# Patient Record
Sex: Female | Born: 1970
Health system: Southern US, Community
[De-identification: ages and names within clinical notes are randomized; demographics above are authoritative.]

## PROBLEM LIST (undated history)

## (undated) DIAGNOSIS — F32A Depression, unspecified: Secondary | ICD-10-CM

## (undated) DIAGNOSIS — J302 Other seasonal allergic rhinitis: Secondary | ICD-10-CM

## (undated) DIAGNOSIS — F329 Major depressive disorder, single episode, unspecified: Secondary | ICD-10-CM

## (undated) DIAGNOSIS — K589 Irritable bowel syndrome without diarrhea: Secondary | ICD-10-CM

## (undated) DIAGNOSIS — T7840XA Allergy, unspecified, initial encounter: Secondary | ICD-10-CM

## (undated) DIAGNOSIS — J45909 Unspecified asthma, uncomplicated: Secondary | ICD-10-CM

## (undated) DIAGNOSIS — F419 Anxiety disorder, unspecified: Secondary | ICD-10-CM

## (undated) HISTORY — DX: Unspecified asthma, uncomplicated: J45.909

## (undated) HISTORY — DX: Other seasonal allergic rhinitis: J30.2

## (undated) HISTORY — DX: Major depressive disorder, single episode, unspecified: F32.9

## (undated) HISTORY — PX: COLPOSCOPY: SHX161

## (undated) HISTORY — DX: Irritable bowel syndrome, unspecified: K58.9

## (undated) HISTORY — DX: Allergy, unspecified, initial encounter: T78.40XA

## (undated) HISTORY — DX: Depression, unspecified: F32.A

## (undated) HISTORY — PX: BUNIONECTOMY: SHX129

## (undated) HISTORY — DX: Anxiety disorder, unspecified: F41.9

---

## 1999-08-22 ENCOUNTER — Other Ambulatory Visit: Admission: RE | Admit: 1999-08-22 | Discharge: 1999-08-22 | Payer: Self-pay | Admitting: Obstetrics and Gynecology

## 2000-11-25 ENCOUNTER — Other Ambulatory Visit: Admission: RE | Admit: 2000-11-25 | Discharge: 2000-11-25 | Payer: Self-pay | Admitting: Obstetrics and Gynecology

## 2001-01-11 ENCOUNTER — Other Ambulatory Visit: Admission: RE | Admit: 2001-01-11 | Discharge: 2001-01-11 | Payer: Self-pay | Admitting: Obstetrics and Gynecology

## 2001-05-25 ENCOUNTER — Other Ambulatory Visit: Admission: RE | Admit: 2001-05-25 | Discharge: 2001-05-25 | Payer: Self-pay | Admitting: Obstetrics and Gynecology

## 2001-07-29 ENCOUNTER — Ambulatory Visit (HOSPITAL_COMMUNITY): Admission: RE | Admit: 2001-07-29 | Discharge: 2001-07-29 | Payer: Self-pay | Admitting: Obstetrics and Gynecology

## 2001-12-16 ENCOUNTER — Other Ambulatory Visit: Admission: RE | Admit: 2001-12-16 | Discharge: 2001-12-16 | Payer: Self-pay | Admitting: Obstetrics and Gynecology

## 2002-12-19 ENCOUNTER — Other Ambulatory Visit: Admission: RE | Admit: 2002-12-19 | Discharge: 2002-12-19 | Payer: Self-pay | Admitting: Obstetrics and Gynecology

## 2003-12-26 ENCOUNTER — Other Ambulatory Visit: Admission: RE | Admit: 2003-12-26 | Discharge: 2003-12-26 | Payer: Self-pay | Admitting: Obstetrics and Gynecology

## 2005-06-12 ENCOUNTER — Other Ambulatory Visit: Admission: RE | Admit: 2005-06-12 | Discharge: 2005-06-12 | Payer: Self-pay | Admitting: Obstetrics and Gynecology

## 2005-06-25 ENCOUNTER — Emergency Department (HOSPITAL_COMMUNITY): Admission: EM | Admit: 2005-06-25 | Discharge: 2005-06-26 | Payer: Self-pay | Admitting: Emergency Medicine

## 2005-06-29 ENCOUNTER — Emergency Department (HOSPITAL_COMMUNITY): Admission: EM | Admit: 2005-06-29 | Discharge: 2005-06-29 | Payer: Self-pay | Admitting: Emergency Medicine

## 2007-01-25 ENCOUNTER — Inpatient Hospital Stay (HOSPITAL_COMMUNITY): Admission: AD | Admit: 2007-01-25 | Discharge: 2007-02-01 | Payer: Self-pay | Admitting: Psychiatry

## 2007-01-25 ENCOUNTER — Emergency Department (HOSPITAL_COMMUNITY): Admission: EM | Admit: 2007-01-25 | Discharge: 2007-01-25 | Payer: Self-pay | Admitting: Emergency Medicine

## 2007-01-25 ENCOUNTER — Ambulatory Visit: Payer: Self-pay | Admitting: Psychiatry

## 2007-04-06 ENCOUNTER — Emergency Department (HOSPITAL_COMMUNITY): Admission: EM | Admit: 2007-04-06 | Discharge: 2007-04-06 | Payer: Self-pay | Admitting: Emergency Medicine

## 2009-10-16 ENCOUNTER — Encounter: Admission: RE | Admit: 2009-10-16 | Discharge: 2009-10-16 | Payer: Self-pay | Admitting: Obstetrics and Gynecology

## 2011-05-15 NOTE — Discharge Summary (Signed)
NAMEMERCADES, BAJAJ            ACCOUNT NO.:  [id number]   MEDICAL RECORD NO.:  [id number]          PATIENT TYPE:  IPS   LOCATION:  0302                          FACILITY:  [facility]   PHYSICIAN:  Anselm Jungling, MD  DATE OF BIRTH:  1971/11/28   DATE OF ADMISSION:  01/25/2007  DATE OF DISCHARGE:  02/01/2007                               DISCHARGE SUMMARY   IDENTIFYING DATA AND REASON FOR ADMISSION:  This was an inpatient  psychiatric admission for Shelia Ramos, a 40 year old married mother, who  presented with longstanding but untreated symptoms of psychosis, in  association with substance abuse.  She came to [place] on a regimen of  Remeron, trazodone, and Valium, apparently prescribed by her family  physician.  She denied drug or alcohol use, but in the course of her  hospital stay we learned that the patient had been abusing  benzodiazepine medications, some which she had apparently been getting  through diversionary means.  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  There was some question of  delusional and/or paranoid thinking.  There was also some question of  suicidal ideation.  The patient had had a past history of anorexia as a  teenager.  She had not slept for 3 days.  There was a history of bipolar  disorder in her mother.  Please refer to the admission note for further  details pertaining to the symptoms, circumstances and history that led  to her hospitalization.  She was given an initial Axis I diagnosis of  rule out bipolar disorder versus schizoaffective disorder, rule out  substance abuse NOS.   MEDICAL AND LABORATORY:  The patient was in good health without any  active or chronic medical problems.  She was noted to have sinus  tachycardia, which apparently predated her hospital stay and its  associated benzodiazepine withdrawal.  She was closely followed by the  psychiatric nurse practitioner, and at the time of discharge was  referred for further medical followup to occur on February 02, 2007, the  day following discharge.  She was, at the end of her hospital stay,  prescribed propranolol 10 mg t.i.d. which was intended both to address  tachycardia and anxiety.   HOSPITAL COURSE:  The patient was admitted to the adult inpatient  psychiatric service.  She presented as a well-nourished, well-developed  woman who was alert, fully oriented, and initially quite tearful,  distraught and anxious.  Her thoughts and speech appeared to be normally  organized, but some staff noted some thought-blocking.  She did not make  any delusional statements.  She stated I want to be able to get off  medicines and have somewhat to talk to, and find ways to better myself.   She was not continued on Remeron or Valium, but was placed on a trial of  Lexapro 10 mg daily as well as h.s. Risperdal to address what appeared  to be mild psychotic symptoms.  These were all well tolerated.   The patient participated in various therapeutic groups and activities  and was a reasonably good participant.  As her stay progressed, she  became more open and forthcoming about circumstances prior to admission.  She reported that she had been having some auditory hallucinations.  She  stated that she had been using ill-gotten benzodiazepines, not to get  high but to feel calmer.   On the fourth hospital day there was a family session involving the  patient and her husband, mother and daughter.  In that meeting the  patient stated that she was not feeling suicidal.  The patient's husband  stated that the family was going to be very supportive of the patient.  They encouraged her to express her feelings more.  They discussed the  fact of the patient's job being stressful for her.  They described her  as a very nurturing person who takes on extra responsibilities at home  and at work.  Husband stated that he was going to try to be more  understanding of  her needs and make sure that the patient gets the help  that she needs.  The patient talked of taking some additional time off  from work.  They discussed followup with a psychiatrist as well as an  Network engineer.  The counselor gave them the pamphlet on suicide  prevention and the crisis hotline.   The patient's hospital stay continued an additional 4 days.  Over that  time she gradually appeared more relaxed, better organized, and the  previously-present subtle indications of psychosis and thought disorder  appeared to abate.  She appeared to be appropriate for discharge on the  eighth hospital day.  She agreed to the following aftercare plan.   AFTERCARE:  The patient was to follow up with Michaelle Copas and for  individual therapy on the day following discharge, February 02, 2007.  She was to follow up with Oneta Rack, physician assistant, for  medication management on February 17, 2007.  Both of these appointments  are at the [facility].   DISCHARGE MEDICATIONS:  1. Lexapro 10 mg daily.  2. Risperdal 1 mg q.h.s.  3. Propranolol 10 mg t.i.d.   The patient was also given an appointment for Dr. Hyacinth Meeker for medical  followup on February 02, 2007.   DISCHARGE DIAGNOSES:  AXIS I:  Brief psychotic disorder, not otherwise  specified, resolving.  Mood disorder not otherwise specified.  Substance abuse not otherwise specified.  AXIS II:  Deferred.  AXIS III:  Tachycardia, unknown etiology.  AXIS IV:  Stressors severe.  AXIS V: Global assessment of functioning on discharge 63.      Anselm Jungling, MD  Electronically Signed     SPB/MEDQ  D:  02/02/2007  T:  02/02/2007  Job:  [phone]

## 2012-07-17 ENCOUNTER — Emergency Department (HOSPITAL_COMMUNITY)
Admission: EM | Admit: 2012-07-17 | Discharge: 2012-07-18 | Disposition: A | Discharge status: Home / Self Care | Payer: 59 | Attending: Emergency Medicine | Admitting: Emergency Medicine

## 2012-07-17 DIAGNOSIS — X58XXXA Exposure to other specified factors, initial encounter: Secondary | ICD-10-CM | POA: Insufficient documentation

## 2012-07-17 DIAGNOSIS — T7809XA Anaphylactic reaction due to other food products, initial encounter: Secondary | ICD-10-CM | POA: Insufficient documentation

## 2012-08-19 ENCOUNTER — Encounter: Payer: Self-pay | Admitting: Gastroenterology

## 2012-09-27 ENCOUNTER — Ambulatory Visit: Payer: 59 | Admitting: Gastroenterology

## 2012-10-25 ENCOUNTER — Ambulatory Visit: Payer: 59 | Admitting: Gastroenterology

## 2014-01-25 ENCOUNTER — Other Ambulatory Visit: Payer: Self-pay | Admitting: Obstetrics and Gynecology

## 2014-01-25 DIAGNOSIS — R928 Other abnormal and inconclusive findings on diagnostic imaging of breast: Secondary | ICD-10-CM

## 2014-02-02 ENCOUNTER — Ambulatory Visit
Admission: RE | Admit: 2014-02-02 | Discharge: 2014-02-02 | Disposition: A | Discharge status: Home / Self Care | Payer: 59 | Source: Ambulatory Visit | Attending: Obstetrics and Gynecology | Admitting: Obstetrics and Gynecology

## 2014-02-02 DIAGNOSIS — R928 Other abnormal and inconclusive findings on diagnostic imaging of breast: Secondary | ICD-10-CM

## 2014-10-25 ENCOUNTER — Telehealth: Payer: Self-pay | Admitting: Gastroenterology

## 2014-10-26 NOTE — Telephone Encounter (Signed)
Please try to find a spot with an extender

## 2014-10-30 ENCOUNTER — Ambulatory Visit (INDEPENDENT_AMBULATORY_CARE_PROVIDER_SITE_OTHER): Payer: 59 | Admitting: Physician Assistant

## 2014-10-30 ENCOUNTER — Encounter: Payer: Self-pay | Admitting: Physician Assistant

## 2014-10-30 VITALS — BP 110/68 | HR 105 | Ht 63.0 in | Wt 113.0 lb

## 2014-10-30 DIAGNOSIS — K589 Irritable bowel syndrome without diarrhea: Secondary | ICD-10-CM

## 2014-10-30 DIAGNOSIS — R143 Flatulence: Secondary | ICD-10-CM

## 2014-10-30 DIAGNOSIS — K921 Melena: Secondary | ICD-10-CM

## 2014-10-30 DIAGNOSIS — IMO0001 Reserved for inherently not codable concepts without codable children: Secondary | ICD-10-CM

## 2014-10-30 DIAGNOSIS — R194 Change in bowel habit: Secondary | ICD-10-CM

## 2014-10-30 MED ORDER — MOVIPREP 100 G PO SOLR: 1.0000 | Freq: Once | ORAL | Status: DC

## 2014-10-30 MED ORDER — HYOSCYAMINE SULFATE 0.125 MG SL SUBL: 0.1250 mg | SUBLINGUAL_TABLET | Freq: Four times a day (QID) | SUBLINGUAL | Status: DC | PRN

## 2014-10-30 MED ORDER — METRONIDAZOLE 250 MG PO TABS: 250.0000 mg | ORAL_TABLET | Freq: Three times a day (TID) | ORAL | Status: DC

## 2014-10-30 NOTE — Progress Notes (Signed)
Reviewed and agree with management. Robert D. Kaplan, M.D., FACG  

## 2014-10-30 NOTE — Patient Instructions (Signed)
You have been scheduled for a colonoscopy. Please follow written instructions given to you at your visit today.  Please pick up your prep kit at the pharmacy within the next 1-3 days. If you use inhalers (even only as needed), please bring them with you on the day of your procedure. Your physician has requested that you go to www.startemmi.com and enter the access code given to you at your visit today. This web site gives a general overview about your procedure. However, you should still follow specific instructions given to you by our office regarding your preparation for the procedure.  We have sent the following medications to your pharmacy for you to pick up at your convenience: Flagyl 250 mg, please take one tablet by mouth three times daily for ten days  Hyomax 0.125 mg, please take one tablet every six hours as needed for abdominal cramping/pain   Please start a high fiber diet information is below for your review   Increase fluid intake   Please purchase Benefiber over the counter and take one tablespoon mix in juice or water and drink once daily _________________________________________________________________________________________________________________________________________________________  High-Fiber Diet Fiber is found in fruits, vegetables, and grains. A high-fiber diet encourages the addition of more whole grains, legumes, fruits, and vegetables in your diet. The recommended amount of fiber for adult males is 38 g per day. For adult females, it is 25 g per day. Pregnant and lactating women should get 28 g of fiber per day. If you have a digestive or bowel problem, ask your caregiver for advice before adding high-fiber foods to your diet. Eat a variety of high-fiber foods instead of only a select few type of foods.  PURPOSE  To increase stool bulk.  To make bowel movements more regular to prevent constipation.  To lower cholesterol.  To prevent overeating. WHEN IS THIS  DIET USED?  It may be used if you have constipation and hemorrhoids.  It may be used if you have uncomplicated diverticulosis (intestine condition) and irritable bowel syndrome.  It may be used if you need help with weight management.  It may be used if you want to add it to your diet as a protective measure against atherosclerosis, diabetes, and cancer. SOURCES OF FIBER  Whole-grain breads and cereals.  Fruits, such as apples, oranges, bananas, berries, prunes, and pears.  Vegetables, such as green peas, carrots, sweet potatoes, beets, broccoli, cabbage, spinach, and artichokes.  Legumes, such split peas, soy, lentils.  Almonds. FIBER CONTENT IN FOODS Starches and Grains / Dietary Fiber (g)  Cheerios, 1 cup / 3 g  Corn Flakes cereal, 1 cup / 0.7 g  Rice crispy treat cereal, 1 cup / 0.3 g  Instant oatmeal (cooked),  cup / 2 g  Frosted wheat cereal, 1 cup / 5.1 g  Brown, long-grain rice (cooked), 1 cup / 3.5 g  White, long-grain rice (cooked), 1 cup / 0.6 g  Enriched macaroni (cooked), 1 cup / 2.5 g Legumes / Dietary Fiber (g)  Baked beans (canned, plain, or vegetarian),  cup / 5.2 g  Kidney beans (canned),  cup / 6.8 g  Pinto beans (cooked),  cup / 5.5 g Breads and Crackers / Dietary Fiber (g)  Plain or honey graham crackers, 2 squares / 0.7 g  Saltine crackers, 3 squares / 0.3 g  Plain, salted pretzels, 10 pieces / 1.8 g  Whole-wheat bread, 1 slice / 1.9 g  White bread, 1 slice / 0.7 g  Raisin bread, 1 slice /  1.2 g  Plain bagel, 3 oz / 2 g  Flour tortilla, 1 oz / 0.9 g  Corn tortilla, 1 small / 1.5 g  Hamburger or hotdog bun, 1 small / 0.9 g Fruits / Dietary Fiber (g)  Apple with skin, 1 medium / 4.4 g  Sweetened applesauce,  cup / 1.5 g  Banana,  medium / 1.5 g  Grapes, 10 grapes / 0.4 g  Orange, 1 small / 2.3 g  Raisin, 1.5 oz / 1.6 g  Melon, 1 cup / 1.4 g Vegetables / Dietary Fiber (g)  Green beans (canned),  cup / 1.3  g  Carrots (cooked),  cup / 2.3 g  Broccoli (cooked),  cup / 2.8 g  Peas (cooked),  cup / 4.4 g  Mashed potatoes,  cup / 1.6 g  Lettuce, 1 cup / 0.5 g  Corn (canned),  cup / 1.6 g  Tomato,  cup / 1.1 g Document Released: 12/14/2005 Document Revised: 06/14/2012 Document Reviewed: 03/17/2012 ExitCare Patient Information 2015 [place], [place]. This information is not intended to replace advice given to you by your health care provider. Make sure you discuss any questions you have with your health care provider.

## 2014-10-30 NOTE — Progress Notes (Signed)
Patient ID: Shelia Ramos, female   DOB: 02-26-1971, 43 y.o.   MRN: [id number]    ASN:[id number] is a 43 year old female who was self-referred for evaluation due to a change in bowel habits, blood with bowel movements, excessive gas, and a history of irritable bowel syndrome.  [name] says that she had been seen by Dr. Deatra Ina in 2000 at which time she was diagnosed with IBS .Since that time she has been feeling well and was having a normal formed bowel movement on a daily basis. Approximately 8 months ago ,she began to have excessive gas, and lower abdominal pain with lots of rumbling. Her bowel movements are hard nuggets, and she typically skips 2 days between bowel movements. She has had multiple episodes where she has had blood on the toilet tissue when she wipes. She has not had blood dripping in the commode or blood mixed in with her stool. She denies rectal pain itching or burning. She reports that she does have a rectocele but she does not have to manually press on the perineum to help with bowel movements come out. Her bowel movements have been good and she's had no weight loss she has had no nausea or vomiting and she has no melena. There is no known family history of colon cancer colon polyps Crohn's disease or ulcerative colitis. She states she had a colonoscopy in her early to mid 49s by Dr. Deatra Ina at which time she was told she had irritable bowel syndrome. She has tried using fiber choice tablets at bedtime with little relief. She reports that she also has a cystocele and has some stress incontinence.She is unable to identify any specific foods that increase her gas. She often will get lower abdominal cramping prior to defecation which is relieved with defecation.   Past Medical History  Diagnosis Date  . Seasonal allergies   . Anxiety     Past Surgical History  Procedure Laterality Date  . Colposcopy    . Bunionectomy     Family History  Problem Relation Age of Onset  . Anxiety  disorder Mother    History  Substance Use Topics  . Smoking status: Never Smoker   . Smokeless tobacco: Not on file  . Alcohol Use: No   Current Outpatient Prescriptions  Medication Sig Dispense Refill  . clonazePAM (KLONOPIN) 0.5 MG tablet Take 0.5 mg by mouth as needed for anxiety.    . ziprasidone (GEODON) 40 MG capsule Take 40 mg by mouth at bedtime.    . hyoscyamine (LEVSIN SL) 0.125 MG SL tablet Place 1 tablet (0.125 mg total) under the tongue every 6 (six) hours as needed. 60 tablet 1  . metroNIDAZOLE (FLAGYL) 250 MG tablet Take 1 tablet (250 mg total) by mouth 3 (three) times daily. 30 tablet 0  . MOVIPREP 100 G SOLR Take 1 kit (200 g total) by mouth once. 1 kit 0   No current facility-administered medications for this visit.   No Known Allergies   Review of Systems: Gen: Denies any fever, chills, sweats, anorexia, fatigue, weakness, malaise, weight loss, and sleep disorder CV: Denies chest pain, angina, palpitations, syncope, orthopnea, PND, peripheral edema, and claudication. Resp: Denies dyspnea at rest, dyspnea with exercise, cough, sputum, wheezing, coughing up blood, and pleurisy. GI: Denies vomiting blood, jaundice, and fecal incontinence.   Denies dysphagia or odynophagia. GU : Denies urinary burning, blood in urine, urinary frequency, urinary hesitancy, nocturnal urination. MS: Denies joint pain, limitation of movement, and swelling, stiffness, low back  pain, extremity pain. Denies muscle weakness, cramps, atrophy.  Derm: Denies rash, itching, dry skin, hives, moles, warts, or unhealing ulcers.  Psych: Denies depression, anxiety, memory loss, suicidal ideation, hallucinations, paranoia, and confusion. Heme: Denies bruising, bleeding, and enlarged lymph nodes. Neuro:  Denies any headaches, dizziness, paresthesias. Endo:  Denies any problems with DM, thyroid, adrenal funct  Physical Exam: BP 110/68 mmHg  Pulse 105  Ht 5' 3"  (1.6 m)  Wt 113 lb (51.256 kg)  BMI  20.02 kg/m2 Constitutional: Pleasant,well-developed, female in no acute distress. HEENT: Normocephalic and atraumatic. Conjunctivae are normal. No scleral icterus. Neck supple. No thyromegaly Cardiovascular: Normal rate, regular rhythm.  Pulmonary/chest: Effort normal and breath sounds normal. No wheezing, rales or rhonchi. Abdominal: Soft, nondistended, nontender. Bowel sounds active throughout. There are no masses palpable. No hepatomegaly. Extremities: no edema Lymphadenopathy: No cervical adenopathy noted. Neurological: Alert and oriented to person place and time. Skin: Skin is warm and dry. No rashes noted. Psychiatric: Normal mood and affect. Behavior is normal.  ASSESSMENT AND PLAN: 43 year old female with a recent change in bowel movements, hematochezia, excess gas here for evaluation. Her symptoms may indeed be due to some irritable bowel syndrome but with the recent rectal bleeding and change in bowel habits, she will be scheduled for colonoscopy to evaluate for polyps, neoplasia, diverticular disease, or any other intra-luminal pathology.The risks, benefits, and alternatives to colonoscopy with possible biopsy and possible polypectomy were discussed with the patient and they consent to proceed. The procedure will be scheduled with Dr. Deatra Ina.  In the meantime, she's been advised to increase fiber in her diet and to use Benefiber 1 heaping tablet daily she's been urged to increase fluid in her diet as well. She will be given a trial of Levsin 0.125 mg every 6 hours as needed for spasm. As she has been having excessive gas she will empirically be given a trial of Flagyl 250 mg 3 times a day for 10 days. This along with a bowel prep which we'll flush her GI tract will help fully wash away any bacteria that may be adding to her gas. Follow-up recommendations will be made pending the findings of her colonoscopy.     [name], Vita Barley PA-C 10/30/2014, 11:15 AM

## 2014-11-01 ENCOUNTER — Ambulatory Visit (AMBULATORY_SURGERY_CENTER): Payer: 59 | Admitting: Gastroenterology

## 2014-11-01 ENCOUNTER — Encounter: Payer: Self-pay | Admitting: Gastroenterology

## 2014-11-01 VITALS — BP 115/70 | HR 78 | Temp 98.2°F | Resp 21 | Ht 63.0 in | Wt 113.0 lb

## 2014-11-01 DIAGNOSIS — K589 Irritable bowel syndrome without diarrhea: Secondary | ICD-10-CM

## 2014-11-01 DIAGNOSIS — K648 Other hemorrhoids: Secondary | ICD-10-CM

## 2014-11-01 DIAGNOSIS — R194 Change in bowel habit: Secondary | ICD-10-CM

## 2014-11-01 MED ORDER — SODIUM CHLORIDE 0.9 % IV SOLN: 500.0000 mL | INTRAVENOUS | Status: DC

## 2014-11-01 NOTE — Progress Notes (Signed)
Patient awakening,vss,report to rn 

## 2014-11-01 NOTE — Op Note (Signed)
[facility] [address] [place], [id number]   COLONOSCOPY PROCEDURE REPORT  PATIENT: Shelia Ramos, [name]  MR#: [id number] BIRTHDATE: [birth year] , 43  yrs. old GENDER: female ENDOSCOPIST: Louis Meckelobert D Frannie Shedrick, MD REFERRED QI:[id number]:Lisa Hyacinth MeekerMiller, M.D. PROCEDURE DATE:  11/01/2014 PROCEDURE:   Colonoscopy, diagnostic First Screening Colonoscopy - Avg.  risk and is 50 yrs.  old or older - No.  Prior Negative Screening - Now for repeat screening. 10 or more years since last screening  History of Adenoma - Now for follow-up colonoscopy & has been > or = to 3 yrs.  N/A  Polyps Removed Today? No.  Recommend repeat exam, <10 yrs? No. ASA CLASS:   Class II INDICATIONS:anal bleeding. MEDICATIONS: Monitored anesthesia care and Propofol 200 mg IV  DESCRIPTION OF PROCEDURE:   After the risks benefits and alternatives of the procedure were thoroughly explained, informed consent was obtained.  The digital rectal exam revealed no abnormalities of the rectum.   The LB [id number]  endoscope was introduced through the anus and advanced to the cecum, which was identified by both the appendix and ileocecal valve. No adverse events experienced.   The quality of the prep was excellent using Suprep  The instrument was then slowly withdrawn as the colon was fully examined.      COLON FINDINGS: Internal hemorrhoids were found.   The examination was otherwise normal.  Retroflexed views revealed no abnormalities. The time to cecum=3 minutes 07 seconds.  Withdrawal time=9 minutes 10 seconds.  The scope was withdrawn and the procedure completed. COMPLICATIONS: There were no immediate complications.  ENDOSCOPIC IMPRESSION: 1.   Internal hemorrhoids - likely source for limited rectal bleeding 2.   The examination was otherwise normal  RECOMMENDATIONS: 1.  fiber supplementation daily 2.  Hemorrhoidal suppositories 3.  MiraLAX 19 g every 2-3 days as necessary 4.  Office visit 4-6 weeks 5.   Colonoscopy 10 years   eSigned:  Louis Meckelobert D Hayle Parisi, MD 11/01/2014 2:56 PM   cc:

## 2014-11-01 NOTE — Patient Instructions (Signed)
YOU HAD AN ENDOSCOPIC PROCEDURE TODAY AT THE [facility]: Refer to the procedure report that was given to you for any specific questions about what was found during the examination.  If the procedure report does not answer your questions, please call your gastroenterologist to clarify.  If you requested that your care partner not be given the details of your procedure findings, then the procedure report has been included in a sealed envelope for you to review at your convenience later.  YOU SHOULD EXPECT: Some feelings of bloating in the abdomen. Passage of more gas than usual.  Walking can help get rid of the air that was put into your GI tract during the procedure and reduce the bloating. If you had a lower endoscopy (such as a colonoscopy or flexible sigmoidoscopy) you may notice spotting of blood in your stool or on the toilet paper. If you underwent a bowel prep for your procedure, then you may not have a normal bowel movement for a few days.  DIET: Your first meal following the procedure should be a light meal and then it is ok to progress to your normal diet.  A half-sandwich or bowl of soup is an example of a good first meal.  Heavy or fried foods are harder to digest and may make you feel nauseous or bloated.  Likewise meals heavy in dairy and vegetables can cause extra gas to form and this can also increase the bloating.  Drink plenty of fluids but you should avoid alcoholic beverages for 24 hours.  ACTIVITY: Your care partner should take you home directly after the procedure.  You should plan to take it easy, moving slowly for the rest of the day.  You can resume normal activity the day after the procedure however you should NOT DRIVE or use heavy machinery for 24 hours (because of the sedation medicines used during the test).    SYMPTOMS TO REPORT IMMEDIATELY: A gastroenterologist can be reached at any hour.  During normal business hours, 8:30 AM to 5:00 PM Monday through Friday,  call [phone].  After hours and on weekends, please call the GI answering service at [phone] who will take a message and have the physician on call contact you.   Following lower endoscopy (colonoscopy or flexible sigmoidoscopy):  Excessive amounts of blood in the stool  Significant tenderness or worsening of abdominal pains  Swelling of the abdomen that is new, acute  Fever of 100F or higher   FOLLOW UP: If any biopsies were taken you will be contacted by phone or by letter within the next 1-3 weeks.  Call your gastroenterologist if you have not heard about the biopsies in 3 weeks.  Our staff will call the home number listed on your records the next business day following your procedure to check on you and address any questions or concerns that you may have at that time regarding the information given to you following your procedure. This is a courtesy call and so if there is no answer at the home number and we have not heard from you through the emergency physician on call, we will assume that you have returned to your regular daily activities without incident.  Hemorrhoid information given.  Hemorrhoidal suppositories.  Mirslac 19 grams every 2-3 days as necessary.   Office visit 4-6 weeks.  Next colonoscopy 10 years.  SIGNATURES/CONFIDENTIALITY: You and/or your care partner have signed paperwork which will be entered into your electronic medical record.  These signatures  attest to the fact that that the information above on your After Visit Summary has been reviewed and is understood.  Full responsibility of the confidentiality of this discharge information lies with you and/or your care-partner.

## 2014-11-02 ENCOUNTER — Telehealth: Payer: Self-pay | Admitting: *Deleted

## 2014-11-02 NOTE — Telephone Encounter (Signed)
  Follow up Call-  Call back number 11/01/2014  Post procedure Call Back phone  # [phone]  Permission to leave phone message Yes     Patient questions:  Do you have a fever, pain , or abdominal swelling? No. Pain Score  0 *  Have you tolerated food without any problems? Yes.    Have you been able to return to your normal activities? Yes.    Do you have any questions about your discharge instructions: Diet   No. Medications  No. Follow up visit  No.  Do you have questions or concerns about your Care? No.  Actions: * If pain score is 4 or above: No action needed, pain <4.

## 2015-01-03 ENCOUNTER — Encounter: Payer: Self-pay | Admitting: Gastroenterology

## 2015-01-03 ENCOUNTER — Ambulatory Visit (INDEPENDENT_AMBULATORY_CARE_PROVIDER_SITE_OTHER): Payer: 59 | Admitting: Gastroenterology

## 2015-01-03 VITALS — BP 116/72 | HR 114 | Ht 63.0 in | Wt 112.0 lb

## 2015-01-03 DIAGNOSIS — K5900 Constipation, unspecified: Secondary | ICD-10-CM | POA: Insufficient documentation

## 2015-01-03 DIAGNOSIS — K625 Hemorrhage of anus and rectum: Secondary | ICD-10-CM

## 2015-01-03 DIAGNOSIS — K648 Other hemorrhoids: Secondary | ICD-10-CM

## 2015-01-03 NOTE — Assessment & Plan Note (Signed)
Excellent response to fiber supplementation.  Plan to continue with the same.

## 2015-01-03 NOTE — Assessment & Plan Note (Signed)
Bleeding is very intermittent minimal.  Recommend hemorrhoidal suppositories as needed.  Should this become a worsening or persistent problem may consider band ligation.

## 2015-01-03 NOTE — Progress Notes (Signed)
      History of Present Illness:  [name] has returned following colonoscopy.  Small internal hemorrhoids were seen.  She has had no further bleeding.  She's taking Metamucil daily and reports improvement in constipation.    Review of Systems: Pertinent positive and negative review of systems were noted in the above HPI section. All other review of systems were otherwise negative.    Current Medications, Allergies, Past Medical History, Past Surgical History, Family History and Social History were reviewed in [facility] electronic medical record  Vital signs were reviewed in today's medical record. Physical Exam: General: Well developed , well nourished, no acute distress   See Assessment and Plan under Problem List

## 2015-01-03 NOTE — Patient Instructions (Signed)
Please follow up as needed 

## 2015-01-03 NOTE — Assessment & Plan Note (Signed)
Secondary to internal hemorrhoids.  Symptoms are minimal.

## 2015-02-06 ENCOUNTER — Other Ambulatory Visit: Payer: Self-pay | Admitting: Physician Assistant

## 2015-12-05 ENCOUNTER — Ambulatory Visit: Payer: 59 | Admitting: Gastroenterology

## 2016-01-15 DIAGNOSIS — Z6821 Body mass index (BMI) 21.0-21.9, adult: Secondary | ICD-10-CM | POA: Diagnosis not present

## 2016-01-15 DIAGNOSIS — Z01419 Encounter for gynecological examination (general) (routine) without abnormal findings: Secondary | ICD-10-CM | POA: Diagnosis not present

## 2016-01-15 DIAGNOSIS — Z1231 Encounter for screening mammogram for malignant neoplasm of breast: Secondary | ICD-10-CM | POA: Diagnosis not present

## 2016-01-15 DIAGNOSIS — Z124 Encounter for screening for malignant neoplasm of cervix: Secondary | ICD-10-CM | POA: Diagnosis not present

## 2016-01-15 LAB — HM PAP SMEAR: HM Pap smear: NORMAL

## 2016-02-11 ENCOUNTER — Ambulatory Visit (INDEPENDENT_AMBULATORY_CARE_PROVIDER_SITE_OTHER): Payer: 59 | Admitting: Gastroenterology

## 2016-02-11 ENCOUNTER — Encounter: Payer: Self-pay | Admitting: Gastroenterology

## 2016-02-11 VITALS — BP 114/76 | HR 116 | Ht 62.5 in | Wt 119.2 lb

## 2016-02-11 DIAGNOSIS — R140 Abdominal distension (gaseous): Secondary | ICD-10-CM | POA: Diagnosis not present

## 2016-02-11 DIAGNOSIS — K589 Irritable bowel syndrome without diarrhea: Secondary | ICD-10-CM | POA: Diagnosis not present

## 2016-02-11 DIAGNOSIS — K5900 Constipation, unspecified: Secondary | ICD-10-CM | POA: Diagnosis not present

## 2016-02-11 DIAGNOSIS — K591 Functional diarrhea: Secondary | ICD-10-CM

## 2016-02-11 DIAGNOSIS — R109 Unspecified abdominal pain: Secondary | ICD-10-CM

## 2016-02-11 MED ORDER — HYOSCYAMINE SULFATE 0.125 MG SL SUBL: SUBLINGUAL_TABLET | SUBLINGUAL | Status: DC

## 2016-02-11 NOTE — Patient Instructions (Signed)
Use Align daily for 1 month or use Kefir yogurt daily We will refill your medication hyoscyamine and send to your pharmacy Follow up in 3 months

## 2016-02-11 NOTE — Progress Notes (Signed)
Shelia Ramos    [id number]    Nov 21, 1971  Primary Care Physician:MILLER,LISA Larita [name], MD  Referring Physician: Sigmund Hazel, MD [address] [place], [place] [address]  Chief complaint:  IBS HPI: 45 year old female with history of irritable bowel syndrome here with complaints of alternating constipation and diarrhea associated with bloating and abdominal cramps. She has episodes of increased bowel frequency every 3 days where she has 3-4 bowel movements a day and in between for the 3 days no bowel movement. Denies any nausea or vomiting. He does have increased bloating when she is constipated and before she has to have a bowel movement she has cramping in the pelvic area. Denies any blood per rectum. She is currently taking 2 chewable fibers a day. Denies taking any laxatives. Colonoscopy in November 2015 was unremarkable except for small internal hemorrhoids. Her weight is stable   Outpatient Encounter Prescriptions as of 02/11/2016  Medication Sig  . clonazePAM (KLONOPIN) 0.5 MG tablet Take 0.5 mg by mouth as needed for anxiety.  . hyoscyamine (LEVSIN SL) 0.125 MG SL tablet PLACE 1 TABLET UNDER TONGUE EVERY 6 HOURS AS NEEDED  . traZODone (DESYREL) 150 MG tablet Take 150 mg by mouth at bedtime.  Burr Medico 150-35 MCG/24HR transdermal patch Place 1 patch onto the skin. One a week for the weeks then one week off every month  . ziprasidone (GEODON) 40 MG capsule Take 40 mg by mouth at bedtime.   No facility-administered encounter medications on file as of 02/11/2016.    Allergies as of 02/11/2016  . (No Known Allergies)    Past Medical History  Diagnosis Date  . Seasonal allergies   . Anxiety   . Depression   . Allergy     SEASONAL  . Asthma     AS A CHILD    Past Surgical History  Procedure Laterality Date  . Colposcopy    . Bunionectomy Right     Family History  Problem Relation Age of Onset  . Anxiety disorder Mother     Social History   Social  History  . Marital Status: Married    Spouse Name: N/A  . Number of Children: N/A  . Years of Education: N/A   Occupational History  . Not on file.   Social History Main Topics  . Smoking status: Never Smoker   . Smokeless tobacco: Never Used  . Alcohol Use: No  . Drug Use: No  . Sexual Activity: Not on file   Other Topics Concern  . Not on file   Social History Narrative      Review of systems: Review of Systems  Constitutional: Negative for fever and chills.  HENT: Negative.   Eyes: Negative for blurred vision.  Respiratory: Negative for cough, shortness of breath and wheezing.   Positive for seasonal allergy and sinus trouble Cardiovascular: Negative for chest pain and palpitations.  Gastrointestinal: as per HPI Genitourinary: Negative for dysuria, urgency, frequency and hematuria.  Musculoskeletal: Negative for myalgias, back pain and joint pain.  Skin: Negative for itching and rash.  Neurological: Negative for dizziness, tremors, focal weakness, seizures and loss of consciousness.  Endo/Heme/Allergies: Negative for environmental allergies.  Psychiatric/Behavioral: Negative for depression, suicidal ideas and hallucinations. Positive for anxiety All other systems reviewed and are negative.   Physical Exam: Filed Vitals:   02/11/16 0843  BP: 114/76  Pulse: 116   Gen:      No acute distress HEENT:  EOMI, sclera anicteric Neck:     No masses; no thyromegaly Lungs:    Clear to auscultation bilaterally; normal respiratory effort CV:         Regular rate and rhythm; no murmurs Abd:      + bowel sounds; soft, non-tender; no palpable masses, no distension Ext:    No edema; adequate peripheral perfusion Skin:      Warm and dry; no rash Neuro: alert and oriented x 3 Psych: normal mood and affect  Data Reviewed: Colonoscopy 10/2014 1. Internal hemorrhoids - likely source for limited rectal bleeding 2. The examination was otherwise normal   Assessment and  Plan/Recommendations:  45 year old female with history of irritable bowel syndrome here with complaints of alternating diarrhea and constipation associated with bloating and abdominal cramping Advised patient to avoid excessive fiber and discontinue the fiber supplements We'll start probiotic align one a day or Kefir once daily Avoid lactose and increased fructose intake Avoid laxatives Continue hyoscyamine as needed Return in 3 months, if continues to have significant symptoms will consider low fodmap diet  Shelia Ramos , MD [phone] Mon-Fri 8a-5p [phone] after 5p, weekends, holidays

## 2016-03-03 DIAGNOSIS — M545 Low back pain: Secondary | ICD-10-CM | POA: Diagnosis not present

## 2016-03-03 DIAGNOSIS — R5383 Other fatigue: Secondary | ICD-10-CM | POA: Diagnosis not present

## 2016-03-03 DIAGNOSIS — G4700 Insomnia, unspecified: Secondary | ICD-10-CM | POA: Diagnosis not present

## 2016-03-03 DIAGNOSIS — R350 Frequency of micturition: Secondary | ICD-10-CM | POA: Diagnosis not present

## 2016-03-23 DIAGNOSIS — R300 Dysuria: Secondary | ICD-10-CM | POA: Diagnosis not present

## 2016-04-14 DIAGNOSIS — H04123 Dry eye syndrome of bilateral lacrimal glands: Secondary | ICD-10-CM | POA: Diagnosis not present

## 2016-04-14 DIAGNOSIS — H1013 Acute atopic conjunctivitis, bilateral: Secondary | ICD-10-CM | POA: Diagnosis not present

## 2016-04-14 DIAGNOSIS — H524 Presbyopia: Secondary | ICD-10-CM | POA: Diagnosis not present

## 2016-04-14 DIAGNOSIS — H01003 Unspecified blepharitis right eye, unspecified eyelid: Secondary | ICD-10-CM | POA: Diagnosis not present

## 2016-05-01 ENCOUNTER — Encounter: Payer: Self-pay | Admitting: Allergy and Immunology

## 2016-05-01 ENCOUNTER — Ambulatory Visit (INDEPENDENT_AMBULATORY_CARE_PROVIDER_SITE_OTHER): Payer: 59 | Admitting: Allergy and Immunology

## 2016-05-01 VITALS — BP 110/75 | HR 110 | Temp 98.2°F | Resp 16 | Ht 62.21 in | Wt 119.0 lb

## 2016-05-01 DIAGNOSIS — J309 Allergic rhinitis, unspecified: Secondary | ICD-10-CM

## 2016-05-01 DIAGNOSIS — R05 Cough: Secondary | ICD-10-CM | POA: Diagnosis not present

## 2016-05-01 DIAGNOSIS — R062 Wheezing: Secondary | ICD-10-CM | POA: Diagnosis not present

## 2016-05-01 DIAGNOSIS — H1010 Acute atopic conjunctivitis, unspecified eye: Secondary | ICD-10-CM | POA: Diagnosis not present

## 2016-05-01 DIAGNOSIS — R059 Cough, unspecified: Secondary | ICD-10-CM

## 2016-05-01 MED ORDER — MONTELUKAST SODIUM 5 MG PO CHEW: CHEWABLE_TABLET | ORAL | Status: DC

## 2016-05-01 MED ORDER — FLUTICASONE PROPIONATE 50 MCG/ACT NA SUSP: NASAL | Status: DC

## 2016-05-01 MED ORDER — ALBUTEROL SULFATE HFA 108 (90 BASE) MCG/ACT IN AERS: 2.0000 | INHALATION_SPRAY | RESPIRATORY_TRACT | Status: DC | PRN

## 2016-05-01 MED ORDER — EPINEPHRINE 0.3 MG/0.3ML IJ SOAJ: 0.3000 mg | Freq: Once | INTRAMUSCULAR | Status: DC

## 2016-05-01 NOTE — Progress Notes (Signed)
FOLLOW UP NOTE  RE: Shelia HooverKimberly D Ensminger MRN: [id number] DOB: 06-24-1971 [facility] [place] [address] [place] [id number] Date of Office Visit: 05/01/2016  Subjective:  Shelia Ramos is a 45 y.o. female who presents today for Allergic Rhinitis  and Cough  Assessment:   1. History of Cough and wheeze, appears consistent with asthma, in no respiratory distress.   2. Allergic rhinoconjunctivitis.   3. Tree nut allergy --- avoidance and emergency action plan in place.    4.      Incomplete follow-up. Plan:   Meds ordered this encounter  Medications  . montelukast (SINGULAIR) 5 MG chewable tablet    Sig: Take two tablets each evening.    Dispense:  60 tablet    Refill:  5  . fluticasone (FLONASE) 50 MCG/ACT nasal spray    Sig: Use 1-2 sprays each nostril each morning.    Dispense:  1 g    Refill:  5  . albuterol (PROAIR HFA) 108 (90 Base) MCG/ACT inhaler    Sig: Inhale 2 puffs into the lungs every 4 (four) hours as needed for wheezing or shortness of breath.    Dispense:  1 Inhaler    Refill:  1  . EPINEPHrine (EPIPEN 2-PAK) 0.3 mg/0.3 mL IJ SOAJ injection    Sig: Inject 0.3 mLs (0.3 mg total) into the muscle once.    Dispense:  2 Device    Refill:  2  1.  Restart Singulair 5mg  (2 tabs) each evening. 2.  Saline nasal wash each evening at shower time. 3.  Flonase 1-2 sprays each evening. 4.  ProAir 2 puffs every 4 hours as needed. 5.  Epi-pen/Benadryl as needed and continue with tree nut avoidance. 6.  If needed may add Zyrtec one-two teaspoons once daily. 7.  Follow-up in 2 months or sooner if needed.  HPI: Shelia Ramos returns to the office with intermittent nasal congestion, postnasal drip and cough,   requesting medication refills, though she has not been seen in our office since February 2015.  She reports increasing symptoms in March, which she thought was related to the fluctuant weather patterns/pollen and began using  over-the-counter medication which were beneficial, more than 60% improvement.  She did not seek medical attention here or with Dr. Hyacinth MeekerMiller and recently has not had albuterol to use.  She found prescription medications in the past for helpful, but typically prefers liquid or chewable meds (has difficulty swallowing pills).  Continues to avoid tree nuts without difficulty.  Denies ED or urgent care visits, prednisone or antibiotic courses. Reports sleep and activity are normal.  Shelia Ramos has a current medication list which includes the following prescription(s): cholecalciferol, clonazepam, hyoscyamine, align, trazodone, vitamin b-12, xulane, ziprasidon.   Drug Allergies: No Known Allergies  Objective:   Filed Vitals:   05/01/16 1057  BP: 110/75  Pulse: 110  Temp: 98.2 F (36.8 C)  Resp: 16   SpO2 Readings from Last 1 Encounters:  05/01/16 95%   Physical Exam  Constitutional: She is well-developed, well-nourished, and in no distress.  HENT:  Head: Atraumatic.  Right Ear: Tympanic membrane and ear canal normal.  Left Ear: Tympanic membrane and ear canal normal.  Nose: Mucosal edema present. No rhinorrhea. No epistaxis.  Mouth/Throat: Oropharynx is clear and moist and mucous membranes are normal. No oropharyngeal exudate, posterior oropharyngeal edema or posterior oropharyngeal erythema.  Neck: Neck supple.  Cardiovascular: Normal rate, S1 normal and S2 normal.   No murmur  heard. Pulmonary/Chest: Effort normal. She has no wheezes. She has no rhonchi. She has no rales.  Lymphadenopathy:    She has no cervical adenopathy.   Diagnostics: Spirometry:  FVC 2.61--70%, FEV1 2.25--84%.    Roselyn M. Willa Rough, MD  cc: Neldon Labella, MD

## 2016-05-01 NOTE — Patient Instructions (Addendum)
   Restart Singulair 5mg  (2 tabs) each evening.  Saline nasal wash each evening at shower time.  Flonase 1-2 sprays each evening.  ProAir 2 puffs every 4 hours as needed.  Epi-pen/Benadryl as needed.  If needed may add Zyrtec one-two teaspoons once daily.  Follow-up in 2 months or sooner if needed.

## 2016-06-05 DIAGNOSIS — E559 Vitamin D deficiency, unspecified: Secondary | ICD-10-CM | POA: Diagnosis not present

## 2016-06-05 DIAGNOSIS — E538 Deficiency of other specified B group vitamins: Secondary | ICD-10-CM | POA: Diagnosis not present

## 2016-06-05 DIAGNOSIS — M2550 Pain in unspecified joint: Secondary | ICD-10-CM | POA: Diagnosis not present

## 2016-06-12 ENCOUNTER — Ambulatory Visit (INDEPENDENT_AMBULATORY_CARE_PROVIDER_SITE_OTHER): Payer: 59 | Admitting: Sports Medicine

## 2016-06-12 ENCOUNTER — Encounter: Payer: Self-pay | Admitting: Sports Medicine

## 2016-06-12 ENCOUNTER — Ambulatory Visit: Payer: Self-pay

## 2016-06-12 DIAGNOSIS — M2011 Hallux valgus (acquired), right foot: Secondary | ICD-10-CM

## 2016-06-12 DIAGNOSIS — M79671 Pain in right foot: Secondary | ICD-10-CM

## 2016-06-12 DIAGNOSIS — M21611 Bunion of right foot: Secondary | ICD-10-CM

## 2016-06-12 DIAGNOSIS — M79672 Pain in left foot: Secondary | ICD-10-CM

## 2016-06-12 NOTE — Progress Notes (Signed)
Patient ID: Shelia Ramos, female   DOB: Apr 28, 1971, 45 y.o.   MRN: [id number] Subjective: Shelia HooverKimberly D Ramos is a 45 y.o. female patient who presents to office for evaluation of Right> Left bunion pain. Patient complains of progressive pain especially over the last year in the Right>Left foot that starts as pain over the bump with direct pressure and  range of motion with crossing over 2nd toe that is becoming more concerning; patient now has difficulty fitting shoes comfortably.Patient had bunion surgery >5 years ago and would like to discuss treatment options. Patient denies any other pedal complaints.   Patient Active Problem List   Diagnosis Date Noted  . Constipation 01/03/2015  . Internal bleeding hemorrhoids 01/03/2015  . Anal bleeding 01/03/2015    Current Outpatient Prescriptions on File Prior to Visit  Medication Sig Dispense Refill  . albuterol (PROAIR HFA) 108 (90 Base) MCG/ACT inhaler Inhale 2 puffs into the lungs every 4 (four) hours as needed for wheezing or shortness of breath. 1 Inhaler 1  . cholecalciferol (VITAMIN D) 1000 units tablet Take 1,000 Units by mouth daily.    . clonazePAM (KLONOPIN) 0.5 MG tablet Take 0.5 mg by mouth as needed for anxiety.    Marland Kitchen. EPINEPHrine (EPIPEN 2-PAK) 0.3 mg/0.3 mL IJ SOAJ injection Inject 0.3 mLs (0.3 mg total) into the muscle once. 2 Device 2  . fluticasone (FLONASE) 50 MCG/ACT nasal spray Use 1-2 sprays each nostril each morning. 1 g 5  . hyoscyamine (LEVSIN SL) 0.125 MG SL tablet PLACE 1 TABLET UNDER TONGUE EVERY 6 HOURS AS NEEDED 60 tablet 3  . montelukast (SINGULAIR) 5 MG chewable tablet Take two tablets each evening. 60 tablet 5  . Probiotic Product (ALIGN) 4 MG CAPS Take 1 capsule by mouth daily. Or Kefir Yogurt daily    . traZODone (DESYREL) 150 MG tablet Take 150 mg by mouth at bedtime.    . vitamin B-12 (CYANOCOBALAMIN) 1000 MCG tablet Take 1,000 mcg by mouth daily.    Burr Medico. XULANE 150-35 MCG/24HR transdermal patch Place 1 patch onto  the skin. One a week for the weeks then one week off every month    . ziprasidone (GEODON) 40 MG capsule Take 40 mg by mouth at bedtime.     No current facility-administered medications on file prior to visit.    No Known Allergies  Objective:  General: Alert and oriented x3 in no acute distress  Dermatology: No open lesions bilateral lower extremities, no webspace macerations, no ecchymosis bilateral, all nails x 10 are well manicured.  Vascular: Dorsalis Pedis and Posterior Tibial pedal pulses 2/4, Capillary Fill Time 3 seconds, (+) pedal hair growth bilateral, no edema bilateral lower extremities, Temperature gradient within normal limits.  Neurology: Gross sensation intact via light touch bilateral, Protective sensation intact  with Semmes Weinstein Monofilament to all pedal sites, Position sense intact, vibratory intact bilateral, Deep tendon reflexes within normal limits bilateral, No babinski sign present bilateral. (-) Tinels sign bilateral.  Musculoskeletal: Mild tenderness with palpation right>left bunion deformity, no limitation or crepitus with range of motion, deformity reducible, tracking not trackbound, there is early crossover of hallux on right, residual mild 1st ray hypermobility noted bilateral. Midtarsal, Subtalar joint, and ankle joint range of motion is within normal limits. On weightbearing exam, forefoot slight abduction with HAV deformity supported on ground with early second toe crossover deformity noted right>left and hammertoe deformity.   Xrays  Right Foot    Impression: Intermetatarsal angle upper limits of normal with lapidus screws  intact possible under-corrected bunion with TSP 5, hallux laterally deviated, mild lesser hammertoe, no other acute findings      Assessment and Plan: Problem List Items Addressed This Visit    None    Visit Diagnoses    Right foot pain    -  Primary    Relevant Orders    DG Foot 2 Views Right    Hallux valgus with bunions,  right        Residual s/p bunion surgery in 2011 with Dr. Ralene Cork       -Complete examination performed -Xrays reviewed -Discussed treatement options; discussed HAV deformity;conservative and  Surgical management; risks, benefits, alternatives discussed. All patient's questions answered. -Patient opt for continued conservative care at this time -Rx Darco Digital alignment toe splint and instructed on use -Recommend continue with good supportive shoes and OTC inserts.  -Recommend icing and Epsom salt soaks as needed -Patient to return to office as needed or sooner if condition worsens.  Asencion Islam, DPM

## 2016-07-02 ENCOUNTER — Ambulatory Visit: Payer: 59 | Admitting: Allergy and Immunology

## 2016-07-08 ENCOUNTER — Ambulatory Visit (INDEPENDENT_AMBULATORY_CARE_PROVIDER_SITE_OTHER): Payer: 59 | Admitting: Allergy and Immunology

## 2016-07-08 ENCOUNTER — Encounter: Payer: Self-pay | Admitting: Allergy and Immunology

## 2016-07-08 VITALS — BP 122/76 | HR 114 | Temp 97.8°F | Resp 17

## 2016-07-08 DIAGNOSIS — J309 Allergic rhinitis, unspecified: Secondary | ICD-10-CM

## 2016-07-08 DIAGNOSIS — R05 Cough: Secondary | ICD-10-CM | POA: Diagnosis not present

## 2016-07-08 DIAGNOSIS — R059 Cough, unspecified: Secondary | ICD-10-CM

## 2016-07-08 DIAGNOSIS — R062 Wheezing: Secondary | ICD-10-CM

## 2016-07-08 DIAGNOSIS — H1010 Acute atopic conjunctivitis, unspecified eye: Secondary | ICD-10-CM | POA: Diagnosis not present

## 2016-07-08 NOTE — Progress Notes (Signed)
FOLLOW UP NOTE  RE: Shelia Ramos MRN: [id number] DOB: Jan 03, 1971 [facility] [place] [address] [place] [id number] Date of Office Visit: 07/08/2016  Subjective:  Shelia Ramos is a 45 y.o. female who presents today for Cough  Assessment:   1. Allergic rhinoconjunctivitis.   2. Cough --clear lung exam, afebrile in no respiratory distress with clear lung exam and normal oxygenation.   3.      Probable mild persistent asthma. 4.      Possible recent acute viral illness as trigger to cough.      Plan:  1.  Prednisolone 25 mg/5 mL--- 1 teaspoon now and once daily for 3 days. 2.  Saline nasal wash 2-4 times daily. 3.  Continue Singulair each evening. 4.  Flonase one spray twice daily. 5.  ProAir HFA 2 puffs every 4 hours as needed for cough or wheeze. 6.  Continue regular moisturizing of skin 2-4 times daily. 7.  May use Mucinex OTC 1-2 times daily as needed.   8.  Consider adding QVAR if persisting symptoms. 9.  Follow-up in 3-4 months or sooner if needed.  HPI: Shelia Ramos returns to the office with report of cough.  She reports her symptoms began approximately one week ago. She is unsure about a specific trigger to symptoms, initially noting slight scratchy throat, wheeze and chest congestion without difficulty breathing, chest tightness or shortness of breath.  Currently she denies fever, headache, or sore throat and no recurring sneezing, runny nose, itchy watery eyes or nasal congestion.  She feels her sleep is pretty good but intermittent cough and she has tried to stay indoors given the increasing temperatures.  She has started using Pro-air 2 puffs twice daily which she finds beneficial.  She did complete a course of antibiotics for skin infection of her thumb after an urgent care visit.  Denies ED visits or prednisone courses.  Shelia Ramos has a current medication list which includes the following prescription(s): albuterol,  cholecalciferol, clonazepam, epinephrine, fluticasone, hyoscyamine, ketoconazole, montelukast, align, trazodone, vitamin b-12, xulane, and ziprasidone.   Drug Allergies: No Known Allergies  Objective:   Filed Vitals:   07/08/16 1135  BP: 122/76  Pulse: 114  Temp: 97.8 F (36.6 C)  Resp: 17   SpO2 Readings from Last 1 Encounters:  07/08/16 95%   Physical Exam  Constitutional: She is well-developed, well-nourished, and in no distress.  Alert interactive communicating easily in full sentences with slight cough.  HENT:  Head: Atraumatic.  Right Ear: Tympanic membrane and ear canal normal.  Left Ear: Tympanic membrane and ear canal normal.  Nose: Mucosal edema present. No rhinorrhea. No epistaxis.  Mouth/Throat: Oropharynx is clear and moist and mucous membranes are normal. No oropharyngeal exudate, posterior oropharyngeal edema or posterior oropharyngeal erythema.  Neck: Neck supple.  Cardiovascular: Normal rate, S1 normal and S2 normal.   No murmur heard. Pulmonary/Chest: Effort normal. No accessory muscle usage. No respiratory distress. She has no wheezes. She has no rhonchi. She has no rales.  Post Xopenex/Atrovent: Continues to be clear to auscultation without wheeze, rhonchi or crackles.  Symmetric aeration, patient reports improved.  Lymphadenopathy:    She has no cervical adenopathy.  Skin: Skin is dry. No cyanosis. Nails show no clubbing.  Scaling rough area to right, thumb well healed excoriated areas.   Diagnostics: Spirometry:  FVC 2.89--97%, FEV1 2.49--99%, essentially no change postbronchodilator.  ACT=14.    Roselyn M. Willa RoughHicks, MD  cc: Neldon LabellaMILLER,LISA LYNN, MD

## 2016-07-08 NOTE — Patient Instructions (Signed)
   Prednisolone 25 mg/5 mL--- 1 teaspoon now and once daily for 3 days.  Saline nasal wash 2-4 times daily.  Continue Singulair each evening.  Flonase one spray twice daily.  ProAir HFA 2 puffs every 4 as needed for cough or wheeze.  Continue regular moisturizing of skin 2-4 times daily.  May use Mucinex OTC 1-2 times daily as needed.    Follow-up in 3-4 months or sooner if needed.

## 2016-07-17 ENCOUNTER — Telehealth: Payer: Self-pay

## 2016-07-17 NOTE — Telephone Encounter (Signed)
Patient seen on 07/08/16 by Dr. Willa RoughHicks. She is still having the bad cough and was told to call back if things do no improve. Her mouth has also broke out again.  Please Advise  Thanks  Can not Take pills [facility] [address]

## 2016-07-22 NOTE — Telephone Encounter (Signed)
Phone call to patient for update. Only voicemail left message. Will try again this afternoon.

## 2016-07-24 ENCOUNTER — Ambulatory Visit (INDEPENDENT_AMBULATORY_CARE_PROVIDER_SITE_OTHER): Payer: 59 | Admitting: Allergy and Immunology

## 2016-07-24 ENCOUNTER — Encounter: Payer: Self-pay | Admitting: Allergy and Immunology

## 2016-07-24 VITALS — BP 122/74 | HR 110 | Temp 98.3°F | Resp 20

## 2016-07-24 DIAGNOSIS — J309 Allergic rhinitis, unspecified: Secondary | ICD-10-CM | POA: Diagnosis not present

## 2016-07-24 DIAGNOSIS — R062 Wheezing: Secondary | ICD-10-CM | POA: Diagnosis not present

## 2016-07-24 DIAGNOSIS — R05 Cough: Secondary | ICD-10-CM

## 2016-07-24 DIAGNOSIS — H1010 Acute atopic conjunctivitis, unspecified eye: Secondary | ICD-10-CM

## 2016-07-24 DIAGNOSIS — R059 Cough, unspecified: Secondary | ICD-10-CM

## 2016-07-24 NOTE — Telephone Encounter (Signed)
Spoke with patient she will come by office at 130pm today and pick up QVAR sample and additional days of Prednisone.  Make follow-up appt for 2 weeks.  Patient agreed with plan.

## 2016-07-24 NOTE — Progress Notes (Signed)
     FOLLOW UP NOTE  RE: Shelia Ramos MRN: [id number] DOB: 1971-10-02 [facility] [place] [address] [place] [address] Date of Office Visit: 07/24/2016  Subjective:  Shelia Ramos is a 45 y.o. female who presents today for Cough  Assessment:   1. Likely persistent asthma with recent history of cough, afebrile and no respiratory distress.   2. Allergic rhinoconjunctivitis   3. Previous difficulty swallowing tablets.   4.      Patient report of typical increased heart rate and reported normal screening laboratories. Plan:   Meds ordered this encounter  Medications  . beclomethasone (QVAR) 80 MCG/ACT inhaler    Sig: Inhale 2 puffs into the lungs 2 (two) times daily.    Dispense:  1 Inhaler    Refill:  2  . Spacer/Aero-Holding Chambers (AEROCHAMBER PLUS FLO-VU LARGE) MISC    Sig: Use with MDI as directed.    Dispense:  1 each    Refill:  1  1.  Saline nasal wash twice daily and as needed. 2.  Complete additional Prednisone 25mg /19ml--one teaspoon once daily for 4 days. 3.  Continue Singulair each evening. 4.  Begin QVAR 2 puffs twice daily (spacer)--rinse mouth with water after use. 5.  ProAir (spacer) 2 puffs every 4 hours as needed for cough or wheeze. 6.  Follow-up in 2-4 weeks or sooner if needed.  HPI: Shelia Ramos returns to the office with persisting cough.  She reports since her last visit 2 weeks ago, she improved but not 100%.  She denies any fever, sore throat, headache, discolored drainage, congestion, sinus pressure, difficulty breathing or shortness of breath.  She does have a history of asthma in childhood, but had typically been symptom-free as an adult.  Completed the course Prednisone--which she thought was beneficial and maintained daily regime as well as used over-the-counter Mucinex, wth ProAir a couple times a day.  She reports her sleep and daily activities are normal without nocturnal awakenings.  Denies ED or  urgent care visits or antibiotic courses. No other associated symptoms.    Shelia Ramos has a current medication list which includes the following prescription(s): albuterol, clonazepam, epinephrine, fluticasone, hyoscyamine, montelukast, trazodone, xulane, ziprasidone.  Drug Allergies: No Known Allergies  Objective:   Vitals:   07/24/16 1403  BP: 122/74  Pulse: (!) 110  Resp: 20  Temp: 98.3 F (36.8 C)   SpO2 Readings from Last 1 Encounters:  07/24/16 98%   Physical Exam  Constitutional: She is well-developed, well-nourished, and in no distress.  HENT:  Head: Atraumatic.  Right Ear: Tympanic membrane and ear canal normal.  Left Ear: Tympanic membrane and ear canal normal.  Nose: Mucosal edema present. No rhinorrhea. No epistaxis.  Mouth/Throat: Oropharynx is clear and moist and mucous membranes are normal. No oropharyngeal exudate, posterior oropharyngeal edema or posterior oropharyngeal erythema.  Neck: Neck supple.  Cardiovascular: Normal rate, S1 normal and S2 normal.   No murmur heard. Pulmonary/Chest: Effort normal. She has no wheezes. She has no rhonchi. She has no rales.  Post Xopenex/Atrovent neb: Continues to be clear.  Patient without wheeze, rhonchi or crackles.  Patient reports significantly improved.  Lymphadenopathy:    She has no cervical adenopathy.   Diagnostics: Spirometry:  FVC 2.20--74%, FEV1  2.15--86% FEF25-75% 2.84--102%;  Significant Post bronchodilator improvement FVC 2.58--86%, FEV1 2.42--97%, FEF25-75% 3.37--121%.     (see scanned image).    Tadeo Besecker M. Willa Rough, MD  cc: Neldon Labella, MD

## 2016-07-24 NOTE — Patient Instructions (Addendum)
    Saline nasal wash twice daily and as needed.  Complete additional Prednisolone 25mg /78ml--one teaspoon once daily for 4 days.  Continue Singulair each evening and Flonase 1-2 sprays each nostril once daily.  Begin QVAR 2 puffs twice daily--rinse mouth with water after use. (spacer)  ProAir 2 puffs every 4 hours as needed for cough or wheeze. (spacer)  Epi-pen/benadryl as needed.  Follow-up in 1 month or sooner if needed.

## 2016-07-26 MED ORDER — AEROCHAMBER PLUS FLO-VU LARGE MISC: 1 refills | Status: DC

## 2016-07-26 MED ORDER — BECLOMETHASONE DIPROPIONATE 80 MCG/ACT IN AERS: 2.0000 | INHALATION_SPRAY | Freq: Two times a day (BID) | RESPIRATORY_TRACT | 2 refills | Status: DC

## 2016-07-31 ENCOUNTER — Other Ambulatory Visit: Payer: Self-pay | Admitting: Allergy and Immunology

## 2016-08-09 DIAGNOSIS — J189 Pneumonia, unspecified organism: Secondary | ICD-10-CM | POA: Diagnosis not present

## 2016-08-09 DIAGNOSIS — J4521 Mild intermittent asthma with (acute) exacerbation: Secondary | ICD-10-CM | POA: Diagnosis not present

## 2016-08-11 DIAGNOSIS — F319 Bipolar disorder, unspecified: Secondary | ICD-10-CM | POA: Diagnosis not present

## 2016-08-11 DIAGNOSIS — G4700 Insomnia, unspecified: Secondary | ICD-10-CM | POA: Diagnosis not present

## 2016-08-11 DIAGNOSIS — F411 Generalized anxiety disorder: Secondary | ICD-10-CM | POA: Diagnosis not present

## 2016-08-25 DIAGNOSIS — F319 Bipolar disorder, unspecified: Secondary | ICD-10-CM | POA: Diagnosis not present

## 2016-08-25 DIAGNOSIS — F411 Generalized anxiety disorder: Secondary | ICD-10-CM | POA: Diagnosis not present

## 2016-08-25 DIAGNOSIS — G4700 Insomnia, unspecified: Secondary | ICD-10-CM | POA: Diagnosis not present

## 2016-09-11 MED FILL — ZIPRASIDONE HCL 80 MG CAP: 80 | 90 days supply | Qty: 90 | Fill #0

## 2016-09-11 MED FILL — traZODone HCL 150 MG TABS: 150 | 90 days supply | Qty: 180 | Fill #0

## 2016-09-11 MED FILL — PROAIR HFA 90 MCG INHALER: 108 (90 BAS | 17 days supply | Qty: 9 | Fill #0

## 2016-09-11 MED FILL — clonazePAM 0.5 MG TABS: 0.5 | 30 days supply | Qty: 30 | Fill #0

## 2016-09-15 DIAGNOSIS — G4700 Insomnia, unspecified: Secondary | ICD-10-CM | POA: Diagnosis not present

## 2016-09-15 DIAGNOSIS — F319 Bipolar disorder, unspecified: Secondary | ICD-10-CM | POA: Diagnosis not present

## 2016-09-15 DIAGNOSIS — F411 Generalized anxiety disorder: Secondary | ICD-10-CM | POA: Diagnosis not present

## 2016-09-23 ENCOUNTER — Ambulatory Visit: Payer: 59 | Admitting: Allergy

## 2016-09-28 ENCOUNTER — Encounter: Payer: Self-pay | Admitting: Allergy

## 2016-09-28 ENCOUNTER — Ambulatory Visit (INDEPENDENT_AMBULATORY_CARE_PROVIDER_SITE_OTHER): Payer: 59 | Admitting: Allergy

## 2016-09-28 ENCOUNTER — Ambulatory Visit: Payer: 59 | Admitting: Family Medicine

## 2016-09-28 ENCOUNTER — Encounter (INDEPENDENT_AMBULATORY_CARE_PROVIDER_SITE_OTHER): Payer: Self-pay

## 2016-09-28 VITALS — BP 110/70 | HR 103 | Temp 97.9°F | Resp 18 | Wt 121.4 lb

## 2016-09-28 DIAGNOSIS — N951 Menopausal and female climacteric states: Secondary | ICD-10-CM | POA: Diagnosis not present

## 2016-09-28 DIAGNOSIS — J4540 Moderate persistent asthma, uncomplicated: Secondary | ICD-10-CM

## 2016-09-28 DIAGNOSIS — M21612 Bunion of left foot: Secondary | ICD-10-CM

## 2016-09-28 DIAGNOSIS — L853 Xerosis cutis: Secondary | ICD-10-CM

## 2016-09-28 DIAGNOSIS — F319 Bipolar disorder, unspecified: Secondary | ICD-10-CM | POA: Diagnosis not present

## 2016-09-28 DIAGNOSIS — J309 Allergic rhinitis, unspecified: Secondary | ICD-10-CM

## 2016-09-28 DIAGNOSIS — R1310 Dysphagia, unspecified: Secondary | ICD-10-CM

## 2016-09-28 DIAGNOSIS — H1010 Acute atopic conjunctivitis, unspecified eye: Secondary | ICD-10-CM | POA: Diagnosis not present

## 2016-09-28 DIAGNOSIS — K582 Mixed irritable bowel syndrome: Secondary | ICD-10-CM

## 2016-09-28 MED ORDER — BECLOMETHASONE DIPROPIONATE 80 MCG/ACT IN AERS: 2.0000 | INHALATION_SPRAY | Freq: Two times a day (BID) | RESPIRATORY_TRACT | 5 refills | Status: DC

## 2016-09-28 NOTE — Progress Notes (Signed)
Follow-up Note  RE: Shelia Ramos MRN: [id number] DOB: 1971-04-24 Date of Office Visit: 09/28/2016   History of present illness: Shelia Ramos is a 45 y.o. female presenting today for follow-up of asthma, allergic rhinoconjunctivitis and food allergy. She was last seen in our office in July 2017 by Dr. Willa Rough.  At her last visit was recommended that she do nasal saline rinses and was started on Qvar 80 2 puffs twice a day. She also was advised to continue taking her Singulair.   Since starting on Qvar the cough has subsided.  She takes her Qvar 2 puffs daily at night before bedtime.  Sometimes before she uses her Qvar she notes a little chest tightness that improves after she uses her Qvar.  She has not needed to use albuterol since starting on Qvar. She denies any nighttime awakenings. She has not had any ED or urgent care visits or need for oral steroids or hospitalization.  With her allergies she has been having more nasal stuffiness and sneezing with the changes in the weather.  She has Flonase that she uses in the morning.  She also takes singulair chewables (she has a hard time swallowing pills) at night.     She continues to avoid tree nuts and has access to an EpiPen.  She sees a GI and has follow-up soon for issues of difficulty swallowing.     She does report dry skin mostly of her hands.  Review of systems: Review of Systems  Constitutional: Negative for chills and fever.  HENT: Positive for congestion. Negative for sore throat.   Eyes: Negative for redness.  Respiratory: Negative for cough and wheezing.   Cardiovascular: Negative for chest pain.  Gastrointestinal: Negative for nausea and vomiting.  Skin: Negative for rash.  Neurological: Negative for headaches.    All other systems negative unless noted above in HPI  Past medical/social/surgical/family history have been reviewed and are unchanged unless specifically indicated below.  No changes  Medication  List:   Medication List       Accurate as of 09/28/16 12:24 PM. Always use your most recent med list.          AEROCHAMBER PLUS FLO-VU LARGE Misc Use with MDI as directed.   albuterol 108 (90 Base) MCG/ACT inhaler Commonly known as:  PROAIR HFA Inhale 2 puffs into the lungs every 4 (four) hours as needed for wheezing or shortness of breath.   ALIGN 4 MG Caps Take 1 capsule by mouth daily. Or Kefir Yogurt daily   cholecalciferol 1000 units tablet Commonly known as:  VITAMIN D Take 1,000 Units by mouth daily.   clonazePAM 0.5 MG tablet Commonly known as:  KLONOPIN Take 0.5 mg by mouth as needed for anxiety.   EPINEPHrine 0.3 mg/0.3 mL Soaj injection Commonly known as:  EPIPEN 2-PAK Inject 0.3 mLs (0.3 mg total) into the muscle once.   fluticasone 50 MCG/ACT nasal spray Commonly known as:  FLONASE Use 1-2 sprays each nostril each morning.   hyoscyamine 0.125 MG SL tablet Commonly known as:  LEVSIN SL PLACE 1 TABLET UNDER TONGUE EVERY 6 HOURS AS NEEDED   ketoconazole 2 % cream Commonly known as:  NIZORAL Apply topically daily.   montelukast 5 MG chewable tablet Commonly known as:  SINGULAIR Take two tablets each evening.   traZODone 150 MG tablet Commonly known as:  DESYREL Take 150 mg by mouth at bedtime.   vitamin B-12 1000 MCG tablet Commonly known as:  CYANOCOBALAMIN  Take 1,000 mcg by mouth daily.   XULANE 150-35 MCG/24HR transdermal patch Generic drug:  norelgestromin-ethinyl estradiol Place 1 patch onto the skin. One a week for the weeks then one week off every month   ziprasidone 40 MG capsule Commonly known as:  GEODON Take 40 mg by mouth as needed.       Known medication allergies: No Known Allergies   Physical examination: Blood pressure 110/70, pulse (!) 103, temperature 97.9 F (36.6 C), temperature source Oral, resp. rate 18, weight 121 lb 6.4 oz (55.1 kg), SpO2 95 %.  General: Alert, interactive, in no acute distress, Anxious     HEENT: TMs pearly gray, turbinates moderately edematous with clear discharge, post-pharynx non erythematous. Neck: Supple without lymphadenopathy. Lungs: Clear to auscultation without wheezing, rhonchi or rales. {no increased work of breathing. CV: Normal S1, S2 without murmurs. Abdomen: Nondistended, nontender. Skin: Warm and dry, without lesions or rashes. Extremities:  No clubbing, cyanosis or edema. Neuro:   Grossly intact.  Diagnositics/Labs: Spirometry: FEV1: 2.35L  79%, FVC: 2.05L 82%    Assessment and plan:   Asthma, Moderate persistent -Cough has improved since starting on Qvar -Continue Qvar 80 2 puffs daily -Continue Singulair 5 mg chewables 2 tabs at bedtime -Continue albuterol inhaler 2 puffs every 4-6 hours as needed for cough or wheeze. Monitor frequency of use -Recommend flu vaccine this season Asthma control goals:   Full participation in all desired activities (may need albuterol before activity)  Albuterol use two time or less a week on average (not counting use with activity)  Cough interfering with sleep two time or less a month  Oral steroids no more than once a year  No hospitalizations  Allergrhinoconjunctivitis -Use Flonase 1-2 sprays each nostril as needed for runny nose or congestion. Advised to use Flonase for a week or 2 at a time before discontinuing. Demonstrated proper nasal spray technique -Use Zyrtec 10 mg as needed  Difficulty swallowing -Recommend following up with GI with possible endoscopy for evaluation of strictures or other issues of the esophagus  Dry skin -Recommend daily use of emollients like Aquaphor, CeraVe, Eucerin   I appreciate the opportunity to take part in Shelia Ramos's care. Please do not hesitate to contact me with questions.  Sincerely,   Margo AyeShaylar Padgett, MD Allergy/Immunology [facility]

## 2016-09-28 NOTE — Patient Instructions (Signed)
Asthma -Cough has improved since starting on Qvar -Continue Qvar 80 2 puffs daily -Continue Singulair 5 mg chewables 2 tabs at bedtime -Continue albuterol inhaler 2 puffs every 4-6 hours as needed for cough or wheeze. Monitor frequency of use -Recommend flu vaccine this season Asthma control goals:   Full participation in all desired activities (may need albuterol before activity)  Albuterol use two time or less a week on average (not counting use with activity)  Cough interfering with sleep two time or less a month  Oral steroids no more than once a year  No hospitalizations  Allergic rhinitis -Use Flonase 1-2 sprays each nostril as needed for runny nose or congestion. Advised to use Flonase for a week or 2 at a time before discontinuing. Demonstrate a proper nasal spray technique -Use Zyrtec 10 mg as needed  Difficulty swallowing -Recommend following up with GI with possible endoscopy for evaluation of strictures or other issues of the esophagus  Dry skin -Recommend daily use of emollients like Aquaphor, CeraVe, Eucerin

## 2016-10-06 ENCOUNTER — Telehealth: Payer: Self-pay | Admitting: *Deleted

## 2016-10-06 ENCOUNTER — Encounter: Payer: Self-pay | Admitting: Sports Medicine

## 2016-10-06 ENCOUNTER — Ambulatory Visit (INDEPENDENT_AMBULATORY_CARE_PROVIDER_SITE_OTHER): Payer: 59 | Admitting: Sports Medicine

## 2016-10-06 DIAGNOSIS — M21611 Bunion of right foot: Secondary | ICD-10-CM

## 2016-10-06 DIAGNOSIS — M2011 Hallux valgus (acquired), right foot: Secondary | ICD-10-CM

## 2016-10-06 DIAGNOSIS — F319 Bipolar disorder, unspecified: Secondary | ICD-10-CM | POA: Diagnosis not present

## 2016-10-06 DIAGNOSIS — M7741 Metatarsalgia, right foot: Secondary | ICD-10-CM

## 2016-10-06 DIAGNOSIS — M79671 Pain in right foot: Secondary | ICD-10-CM

## 2016-10-06 DIAGNOSIS — G4700 Insomnia, unspecified: Secondary | ICD-10-CM | POA: Diagnosis not present

## 2016-10-06 DIAGNOSIS — F411 Generalized anxiety disorder: Secondary | ICD-10-CM | POA: Diagnosis not present

## 2016-10-06 DIAGNOSIS — M779 Enthesopathy, unspecified: Secondary | ICD-10-CM | POA: Diagnosis not present

## 2016-10-06 MED ORDER — NONFORMULARY OR COMPOUNDED ITEM: 11 refills | Status: DC

## 2016-10-06 NOTE — Progress Notes (Signed)
Patient ID: VIVION ROMANO, female   DOB: October 29, 1971, 45 y.o.   MRN: [id number] Subjective: Shelia Ramos is a 45 y.o. female patient who returns to office for evaluation of Right> Left bunion pain. Patient complains of continued bunion pain even after trying toe splint. Takes aspirin at night which helps. Patient denies any other pedal complaints.   Patient Active Problem List   Diagnosis Date Noted  . Moderate persistent asthma, uncomplicated 09/28/2016  . Allergic rhinoconjunctivitis 09/28/2016  . Dysphagia 09/28/2016  . Constipation 01/03/2015  . Internal bleeding hemorrhoids 01/03/2015  . Anal bleeding 01/03/2015    Current Outpatient Prescriptions on File Prior to Visit  Medication Sig Dispense Refill  . albuterol (PROAIR HFA) 108 (90 Base) MCG/ACT inhaler Inhale 2 puffs into the lungs every 4 (four) hours as needed for wheezing or shortness of breath. 1 Inhaler 1  . beclomethasone (QVAR) 80 MCG/ACT inhaler Inhale 2 puffs into the lungs 2 (two) times daily. 1 Inhaler 5  . cholecalciferol (VITAMIN D) 1000 units tablet Take 1,000 Units by mouth daily.    . clonazePAM (KLONOPIN) 0.5 MG tablet Take 0.5 mg by mouth as needed for anxiety.    Marland Kitchen EPINEPHrine (EPIPEN 2-PAK) 0.3 mg/0.3 mL IJ SOAJ injection Inject 0.3 mLs (0.3 mg total) into the muscle once. 2 Device 2  . fluticasone (FLONASE) 50 MCG/ACT nasal spray Use 1-2 sprays each nostril each morning. 1 g 5  . hyoscyamine (LEVSIN SL) 0.125 MG SL tablet PLACE 1 TABLET UNDER TONGUE EVERY 6 HOURS AS NEEDED 60 tablet 3  . ketoconazole (NIZORAL) 2 % cream Apply topically daily.  0  . montelukast (SINGULAIR) 5 MG chewable tablet Take two tablets each evening. 60 tablet 5  . Probiotic Product (ALIGN) 4 MG CAPS Take 1 capsule by mouth daily. Or Kefir Yogurt daily    . Spacer/Aero-Holding Chambers (AEROCHAMBER PLUS FLO-VU LARGE) MISC Use with MDI as directed. 1 each 1  . traZODone (DESYREL) 150 MG tablet Take 150 mg by mouth at bedtime.     . vitamin B-12 (CYANOCOBALAMIN) 1000 MCG tablet Take 1,000 mcg by mouth daily.    Burr Medico 150-35 MCG/24HR transdermal patch Place 1 patch onto the skin. One a week for the weeks then one week off every month    . ziprasidone (GEODON) 40 MG capsule Take 40 mg by mouth as needed.      No current facility-administered medications on file prior to visit.     No Known Allergies  Objective:  General: Alert and oriented x3 in no acute distress  Dermatology: No open lesions bilateral lower extremities, no webspace macerations, no ecchymosis bilateral, all nails x 10 are well manicured.  Vascular: Dorsalis Pedis and Posterior Tibial pedal pulses 2/4, Capillary Fill Time 3 seconds, (+) pedal hair growth bilateral, no edema bilateral lower extremities, Temperature gradient within normal limits.  Neurology: Gross sensation intact via light touch bilateral, Protective sensation intact  with Semmes Weinstein Monofilament to all pedal sites, Position sense intact, vibratory intact bilateral, Deep tendon reflexes within normal limits bilateral, No babinski sign present bilateral. (-) Tinels sign bilateral.  Musculoskeletal: Mild tenderness with palpation right>left bunion deformity, no limitation or crepitus with range of motion, deformity reducible, tracking not trackbound, there is early crossover of hallux on right, residual mild 1st ray hypermobility noted bilateral. Midtarsal, Subtalar joint, and ankle joint range of motion is within normal limits. On weightbearing exam, forefoot slight abduction with HAV deformity supported on ground with early second toe crossover  deformity noted right>left and hammertoe deformity.       Assessment and Plan: Problem List Items Addressed This Visit    None    Visit Diagnoses    Right foot pain    -  Primary   Hallux valgus with bunions, right       Capsulitis       Metatarsalgia of right foot          -Complete examination performed -Previous Xrays  reviewed -Discussed treatement options; discussed HAV deformity;conservative and  Surgical management; risks, benefits, alternatives discussed. All patient's questions answered. -Patient opt for continued conservative care at this time -Rx [employer] topical -Continue with [employer] alignment toe splint -Recommend continue with good supportive shoes and OTC inserts.  -Recommend icing and Epsom salt soaks as needed -Patient to return to office as needed or sooner if condition worsens.  Shelia Ramos, DPM

## 2016-10-06 NOTE — Telephone Encounter (Addendum)
-----   Message from Asencion Islamitorya Stover, [place] sent at 10/06/2016 12:00 PM EDT ----- Regarding: [employer] Topical antiflammatory cream to use over bunion on right foot -Dr. Marylene LandStover. [facility] orders to [employer] [facility].

## 2016-10-07 MED FILL — XULANE PATCH: 150-35 | 84 days supply | Qty: 9 | Fill #0

## 2016-10-08 MED FILL — MONTELUKAST SOD 5 MG TAB CH: 5 | 30 days supply | Qty: 60 | Fill #0

## 2016-10-09 ENCOUNTER — Encounter: Payer: Self-pay | Admitting: Family Medicine

## 2016-10-09 ENCOUNTER — Ambulatory Visit (INDEPENDENT_AMBULATORY_CARE_PROVIDER_SITE_OTHER): Payer: 59 | Admitting: Family Medicine

## 2016-10-09 VITALS — BP 132/84 | HR 109 | Ht 62.5 in | Wt 119.0 lb

## 2016-10-09 DIAGNOSIS — M21612 Bunion of left foot: Secondary | ICD-10-CM | POA: Diagnosis not present

## 2016-10-09 DIAGNOSIS — F5101 Primary insomnia: Secondary | ICD-10-CM

## 2016-10-09 DIAGNOSIS — R009 Unspecified abnormalities of heart beat: Secondary | ICD-10-CM

## 2016-10-09 DIAGNOSIS — R32 Unspecified urinary incontinence: Secondary | ICD-10-CM

## 2016-10-09 DIAGNOSIS — K582 Mixed irritable bowel syndrome: Secondary | ICD-10-CM

## 2016-10-09 DIAGNOSIS — K589 Irritable bowel syndrome without diarrhea: Secondary | ICD-10-CM | POA: Insufficient documentation

## 2016-10-09 DIAGNOSIS — N951 Menopausal and female climacteric states: Secondary | ICD-10-CM

## 2016-10-09 DIAGNOSIS — R030 Elevated blood-pressure reading, without diagnosis of hypertension: Secondary | ICD-10-CM

## 2016-10-09 DIAGNOSIS — G4700 Insomnia, unspecified: Secondary | ICD-10-CM | POA: Insufficient documentation

## 2016-10-09 DIAGNOSIS — Z23 Encounter for immunization: Secondary | ICD-10-CM

## 2016-10-09 DIAGNOSIS — F319 Bipolar disorder, unspecified: Secondary | ICD-10-CM | POA: Insufficient documentation

## 2016-10-09 NOTE — Assessment & Plan Note (Addendum)
[facility] [place]  Shaylar Larose HiresPatricia Padgett, MD

## 2016-10-09 NOTE — Patient Instructions (Addendum)
Please realize, EXERCISE IS MEDICINE!  -  American Heart Association [facility]) guidelines for exercise : If you are in good health, without any medical conditions, you should engage in 150 minutes of moderate intensity aerobic activity per week.  This means you should be huffing and puffing throughout your workout.   Engaging in regular exercise will improve brain function and memory, as well as improve mood, boost immune system and help with weight management.  As well as the other, more well-known effects of exercise such as decreasing blood sugar levels, decreasing blood pressure,  and decreasing bad cholesterol levels/ increasing good cholesterol levels.     -  The AHA strongly endorses consumption of a diet that contains a variety of foods from all the food categories with an emphasis on fruits and vegetables; fat-free and low-fat dairy products; cereal and grain products; legumes and nuts; and fish, poultry, and/or extra lean meats.    Excessive food intake, especially of foods high in saturated and trans fats, sugar, and salt, should be avoided.    Adequate water intake of roughly 1/2 of your weight in pounds, should equal the ounces of water per day you should drink.  So for instance, if you're 200 pounds, that would be 100 ounces of water per day.         Mediterranean Diet  Why follow it? Research shows. . Those who follow the Mediterranean diet have a reduced risk of heart disease  . The diet is associated with a reduced incidence of Parkinson's and Alzheimer's diseases . People following the diet may have longer life expectancies and lower rates of chronic diseases  . The Dietary Guidelines for Americans recommends the Mediterranean diet as an eating plan to promote health and prevent disease  What Is the Mediterranean Diet?  . Healthy eating plan based on typical foods and recipes of Mediterranean-style cooking . The diet is primarily a plant based diet; these foods should make up a  majority of meals   Starches - Plant based foods should make up a majority of meals - They are an important sources of vitamins, minerals, energy, antioxidants, and fiber - Choose whole grains, foods high in fiber and minimally processed items  - Typical grain sources include wheat, oats, barley, corn, brown rice, bulgar, farro, millet, polenta, couscous  - Various types of beans include chickpeas, lentils, fava beans, black beans, white beans   Fruits  Veggies - Large quantities of antioxidant rich fruits & veggies; 6 or more servings  - Vegetables can be eaten raw or lightly drizzled with oil and cooked  - Vegetables common to the traditional Mediterranean Diet include: artichokes, arugula, beets, broccoli, brussel sprouts, cabbage, carrots, celery, collard greens, cucumbers, eggplant, kale, leeks, lemons, lettuce, mushrooms, okra, onions, peas, peppers, potatoes, pumpkin, radishes, rutabaga, shallots, spinach, sweet potatoes, turnips, zucchini - Fruits common to the Mediterranean Diet include: apples, apricots, avocados, cherries, clementines, dates, figs, grapefruits, grapes, melons, nectarines, oranges, peaches, pears, pomegranates, strawberries, tangerines  Fats - Replace butter and margarine with healthy oils, such as olive oil, canola oil, and tahini  - Limit nuts to no more than a handful a day  - Nuts include walnuts, almonds, pecans, pistachios, pine nuts  - Limit or avoid candied, honey roasted or heavily salted nuts - Olives are central to the Mediterranean diet - can be eaten whole or used in a variety of dishes   Meats Protein - Limiting red meat: no more than a few times a month -  When eating red meat: choose lean cuts and keep the portion to the size of deck of cards - Eggs: approx. 0 to 4 times a week  - Fish and lean poultry: at least 2 a week  - Healthy protein sources include, chicken, turkey, lean beef, lamb - Increase intake of seafood such as tuna, salmon, trout,  mackerel, shrimp, scallops - Avoid or limit high fat processed meats such as sausage and bacon  Dairy - Include moderate amounts of low fat dairy products  - Focus on healthy dairy such as fat free yogurt, skim milk, low or reduced fat cheese - Limit dairy products higher in fat such as whole or 2% milk, cheese, ice cream  Alcohol - Moderate amounts of red wine is ok  - No more than 5 oz daily for women (all ages) and men older than age 65  - No more than 10 oz of wine daily for men younger than 65  Other - Limit sweets and other desserts  - Use herbs and spices instead of salt to flavor foods  - Herbs and spices common to the traditional Mediterranean Diet include: basil, bay leaves, chives, cloves, cumin, fennel, garlic, lavender, marjoram, mint, oregano, parsley, pepper, rosemary, sage, savory, sumac, tarragon, thyme   It's not just a diet, it's a lifestyle:  . The Mediterranean diet includes lifestyle factors typical of those in the region  . Foods, drinks and meals are best eaten with others and savored . Daily physical activity is important for overall good health . This could be strenuous exercise like running and aerobics . This could also be more leisurely activities such as walking, housework, yard-work, or taking the stairs . Moderation is the key; a balanced and healthy diet accommodates most foods and drinks . Consider portion sizes and frequency of consumption of certain foods   Meal Ideas & Options:  . Breakfast:  o Whole wheat toast or whole wheat English muffins with peanut butter & hard boiled egg o Steel cut oats topped with apples & cinnamon and skim milk  o Fresh fruit: banana, strawberries, melon, berries, peaches  o Smoothies: strawberries, bananas, greek yogurt, peanut butter o Low fat greek yogurt with blueberries and granola  o Egg white omelet with spinach and mushrooms o Breakfast couscous: whole wheat couscous, apricots, skim milk, cranberries  . Sandwiches:   o Hummus and grilled vegetables (peppers, zucchini, squash) on whole wheat bread   o Grilled chicken on whole wheat pita with lettuce, tomatoes, cucumbers or tzatziki  o Tuna salad on whole wheat bread: tuna salad made with greek yogurt, olives, red peppers, capers, green onions o Garlic rosemary lamb pita: lamb sauted with garlic, rosemary, salt & pepper; add lettuce, cucumber, greek yogurt to pita - flavor with lemon juice and black pepper  . Seafood:  o Mediterranean grilled salmon, seasoned with garlic, basil, parsley, lemon juice and black pepper o Shrimp, lemon, and spinach whole-grain pasta salad made with low fat greek yogurt  o Seared scallops with lemon orzo  o Seared tuna steaks seasoned salt, pepper, coriander topped with tomato mixture of olives, tomatoes, olive oil, minced garlic, parsley, green onions and cappers  . Meats:  o Herbed greek chicken salad with kalamata olives, cucumber, feta  o Red bell peppers stuffed with spinach, bulgur, lean ground beef (or lentils) & topped with feta   o Kebabs: skewers of chicken, tomatoes, onions, zucchini, squash  o Turkey burgers: made with red onions, mint, dill, lemon juice, feta   cheese topped with roasted red peppers . Vegetarian o Cucumber salad: cucumbers, artichoke hearts, celery, red onion, feta cheese, tossed in olive oil & lemon juice  o Hummus and whole grain pita points with a greek salad (lettuce, tomato, feta, olives, cucumbers, red onion) o Lentil soup with celery, carrots made with vegetable broth, garlic, salt and pepper  o Tabouli salad: parsley, bulgur, mint, scallions, cucumbers, tomato, radishes, lemon juice, olive oil, salt and pepper.      Menopause Menopause is the normal time of life when menstrual periods stop completely. Menopause is complete when you have missed 12 consecutive menstrual periods. It usually occurs between the ages of 48 years and 55 years. Very rarely does a woman develop menopause before the  age of 40 years. At menopause, your ovaries stop producing the female hormones estrogen and progesterone. This can cause undesirable symptoms and also affect your health. Sometimes the symptoms may occur 4-5 years before the menopause begins. There is no relationship between menopause and:  Oral contraceptives.  Number of children you had.  Race.  The age your menstrual periods started (menarche). Heavy smokers and very thin women may develop menopause earlier in life. CAUSES  The ovaries stop producing the female hormones estrogen and progesterone.  Other causes include:  Surgery to remove both ovaries.  The ovaries stop functioning for no known reason.  Tumors of the pituitary gland in the brain.  Medical disease that affects the ovaries and hormone production.  Radiation treatment to the abdomen or pelvis.  Chemotherapy that affects the ovaries. SYMPTOMS   Hot flashes.  Night sweats.  Decrease in sex drive.  Vaginal dryness and thinning of the vagina causing painful intercourse.  Dryness of the skin and developing wrinkles.  Headaches.  Tiredness.  Irritability.  Memory problems.  Weight gain.  Bladder infections.  Hair growth of the face and chest.  Infertility. More serious symptoms include:  Loss of bone (osteoporosis) causing breaks (fractures).  Depression.  Hardening and narrowing of the arteries (atherosclerosis) causing heart attacks and strokes. DIAGNOSIS   When the menstrual periods have stopped for 12 straight months.  Physical exam.  Hormone studies of the blood. TREATMENT  There are many treatment choices and nearly as many questions about them. The decisions to treat or not to treat menopausal changes is an individual choice made with your health care provider. Your health care provider can discuss the treatments with you. Together, you can decide which treatment will work best for you. Your treatment choices may include:    Hormone therapy (estrogen and progesterone).  Non-hormonal medicines.  Treating the individual symptoms with medicine (for example antidepressants for depression).  Herbal medicines that may help specific symptoms.  Counseling by a psychiatrist or psychologist.  Group therapy.  Lifestyle changes including:  Eating healthy.  Regular exercise.  Limiting caffeine and alcohol.  Stress management and meditation.  No treatment. HOME CARE INSTRUCTIONS   Take the medicine your health care provider gives you as directed.  Get plenty of sleep and rest.  Exercise regularly.  Eat a diet that contains calcium (good for the bones) and soy products (acts like estrogen hormone).  Avoid alcoholic beverages.  Do not smoke.  If you have hot flashes, dress in layers.  Take supplements, calcium, and vitamin D to strengthen bones.  You can use over-the-counter lubricants or moisturizers for vaginal dryness.  Group therapy is sometimes very helpful.  Acupuncture may be helpful in some cases. SEEK MEDICAL CARE IF:  You are not sure you are in menopause.  You are having menopausal symptoms and need advice and treatment.  You are still having menstrual periods after age 38 years.  You have pain with intercourse.  Menopause is complete (no menstrual period for 12 months) and you develop vaginal bleeding.  You need a referral to a specialist (gynecologist, psychiatrist, or psychologist) for treatment. SEEK IMMEDIATE MEDICAL CARE IF:   You have severe depression.  You have excessive vaginal bleeding.  You fell and think you have a broken bone.  You have pain when you urinate.  You develop leg or chest pain.  You have a fast pounding heart beat (palpitations).  You have severe headaches.  You develop vision problems.  You feel a lump in your breast.  You have abdominal pain or severe indigestion.   This information is not intended to replace advice given to you  by your health care provider. Make sure you discuss any questions you have with your health care provider.   Document Released: 03/05/2004 Document Revised: 08/16/2013 Document Reviewed: 07/13/2013 Elsevier Interactive Patient Education 2016 [employer].    Menopause and Herbal Products WHAT IS MENOPAUSE? Menopause is the normal time of life when menstrual periods decrease in frequency and eventually stop completely. This process can take several years for some women. Menopause is complete when you have had an absence of menstruation for a full year since your last menstrual period. It usually occurs between the ages of 18 and 91. It is not common for menopause to begin before the age of 33. During menopause, your body stops producing the female hormones estrogen and progesterone. Common symptoms associated with this loss of hormones (vasomotor symptoms) are:  Hot flashes.  Hot flushes.  Night sweats. Other common symptoms and complications of menopause include:  Decrease in sex drive.  Vaginal dryness and thinning of the walls of the vagina. This can make sex painful.  Dryness of the skin and development of wrinkles.  Headaches.  Tiredness.  Irritability.  Memory problems.  Weight gain.  Bladder infections.  Hair growth on the face and chest.  Inability to reproduce offspring (infertility).  Loss of density in the bones (osteoporosis) increasing your risk for breaks (fractures).  Depression.  Hardening and narrowing of the arteries (atherosclerosis). This increases your risk of heart attack and stroke. WHAT TREATMENT OPTIONS ARE AVAILABLE? There are many treatment choices for menopause symptoms. The most common treatment is hormone replacement therapy. Many alternative therapies for menopause are emerging, including the use of herbal products. These supplements can be found in the form of herbs, teas, oils, tinctures, and pills. Common herbal supplements for  menopause are made from plants that contain phytoestrogens. Phytoestrogens are compounds that occur naturally in plants and plant products. They act like estrogen in the body. Foods and herbs that contain phytoestrogens include:  Soy.  Flax seeds.  Red clover.  Ginseng. WHAT MENOPAUSE SYMPTOMS MAY BE HELPED IF I USE HERBAL PRODUCTS?  Vasomotor symptoms. These may be helped by:  Soy. Some studies show that soy may have a moderate benefit for hot flashes.  Black cohosh. There is limited evidence indicating this may be beneficial for hot flashes.  Symptoms that are related to heart and blood vessel disease. These may be helped by soy. Studies have shown that soy can help to lower cholesterol.  Depression. This may be helped by:  St. John's wort. There is limited evidence that shows this may help mild to moderate depression.  Black cohosh. There is evidence that this may help depression and mood swings.  Osteoporosis. Soy may help to decrease bone loss that is associated with menopause and may prevent osteoporosis. Limited evidence indicates that red clover may offer some bone loss protection as well. Other herbal products that are commonly used during menopause lack enough evidence to support their use as a replacement for conventional menopause therapies. These products include evening primrose, ginseng, and red clover. WHAT ARE THE CASES WHEN HERBAL PRODUCTS SHOULD NOT BE USED DURING MENOPAUSE? Do not use herbal products during menopause without your health care provider's approval if:  You are taking medicine.  You have a preexisting liver condition. ARE THERE ANY RISKS IN MY TAKING HERBAL PRODUCTS DURING MENOPAUSE? If you choose to use herbal products to help with symptoms of menopause, keep in mind that:  Different supplements have different and unmeasured amounts of herbal ingredients.  Herbal products are not regulated the same way that medicines are.  Concentrations of  herbs may vary depending on the way they are prepared. For example, the concentration may be different in a pill, tea, oil, and tincture.  Little is known about the risks of using herbal products, particularly the risks of long-term use.  Some herbal supplements can be harmful when combined with certain medicines. Most commonly reported side effects of herbal products are mild. However, if used improperly, many herbal supplements can cause serious problems. Talk to your health care provider before starting any herbal product. If problems develop, stop taking the supplement and let your health care provider know.   This information is not intended to replace advice given to you by your health care provider. Make sure you discuss any questions you have with your health care provider.   Document Released: 06/01/2008 Document Revised: 01/04/2015 Document Reviewed: 05/29/2014 Elsevier Interactive Patient Education [employer]

## 2016-10-09 NOTE — Assessment & Plan Note (Signed)
Sees Dr. Marylene LandStover of podiatry

## 2016-10-09 NOTE — Progress Notes (Signed)
New patient office visit note:  Impression and Recommendations:    Explained my role as her primary care physician in helping prevent and screening for diseases.  Explained to patient that her specialists she has been treating her for her various conditions she will continue with as she has her currently under their care.  1. Elevated heart rate with elevated blood pressure without diagnosis of hypertension - Appears to be related to her anxiety and emotional state.  Asked to monitor it at home.  We discussed meditation, yoga, exercise and prudent diet, sleep hygiene etc to help control her emotional state.    2. Incontinence in female - Address with her GYN re: weak pelvic floor muscles - kegel's discussed   3. Primary insomnia  - Sleep hygiene discussed and handouts provided - Meds per psychiatry   4. Bunion of great toe of left foot  Care per podiatry team of physicians and caretakers   5. Irritable bowel syndrome with both constipation and diarrhea - Has gastroenterologist she sees regularly for this. We discussed controlling her emotions to better control her IBS    6. Perimenopause- on Xulane  - Told patient any hormonal issues\poorly controlled menopausal symptoms need to be discussed with her GYN who is currently treating her for that..  We did discuss natural supplements and handouts were provided   7. Need for prophylactic vaccination and inoculation against influenza    - flu vaccine given  Meds ordered this encounter  Medications  . Probiotic Product (PROBIOTIC DAILY PO)    Sig: Take 1 tablet by mouth daily.  . Inulin (FIBER CHOICE FRUITY BITES) 1.5 g CHEW    Sig: Chew 2 Units by mouth daily.   The patient was counseled, risk factors were discussed, anticipatory guidance given.  Gross side effects, risk and benefits, and alternatives of medications discussed with patient.  Patient is aware that all medications have potential side effects and we are  unable to predict every side effect or drug-drug interaction that may occur.  Expresses verbal understanding and consents to current therapy plan and treatment regimen.  Orders Placed This Encounter  Procedures  . Flu Vaccine QUAD 36+ mos IM   Return for f/up q 4-6 mo.   Please see AVS handed out to patient at the end of our visit for further patient instructions/ counseling done pertaining to today's office visit.    Note: This document was prepared using Dragon voice recognition software and may include unintentional dictation errors.  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  HPI: GERALENE AFSHAR is a pleasant 45 y.o. female who presents to [facility] today to review their medical history with me and establish care.   I asked the patient to review their chronic problem list with me to ensure everything was updated and accurate.    Previous PCP- Dr Misty Stanley Miller---> from [place]. In March had bld wrk---> will get me results- she declines them today   IBS dx in 20's-  Sees dr Lavon Paganini of GI.  Podiatry- Dr Marylene Land bunion sx 2012 R 1st big toe.   Asthma and allergy- Dr.  Delorse Lek- symptoms are stable  2008- was involuntarily committed to Behavioral Health inpt at [place].  She then went to several days of the outpatient therapy as well.   She now goes to [employer] [facility] counseling center-->  Kelly virgil- counsel;     she sees Roni Breadobin Britches- NP- for med mgt.  diagnosed with          Bi-polar, anxiety, and manic depressive d/o. --> counseling 2-3 wks. sees [place]la[place] for counseling  No h/o high Bp: But initial blood pressure and pulse is up-to-date which she says it always is when she is at the doctor's office since her anxiety makes it that way.  Never been on meds.   Blood pressure when patient first came into the office was 164/93 , 109.   Doesn't check at home. Totally symptomatic   Also Mentions several other concerns 1)  leaking urine at times- stress incont- which she has addressed with her GYN Dr [place] CloudHorvath, 2)  Also questions about her bunion, 3) concerns with menopausal sx and questions about her hormone therapy, 4) as well as concerns about her meds for anxiety and depression. Marland Kitchen. He isShe denied SI/ thoughts     Patient Care Team    Relationship Specialty Notifications Start End  Thomasene Loteborah Meena Barrantes, DO PCP - General Family Medicine  09/28/16   Napoleon FormKavitha V Nandigam, MD Consulting Physician Gastroenterology  10/09/16   Asencion Islamitorya Stover, DPM Consulting Physician Podiatry  10/09/16   Carrington ClampMichelle Horvath, MD Consulting Physician Obstetrics and Gynecology  10/09/16   Shaylar Larose HiresPatricia Padgett, MD Consulting Physician Allergy  10/09/16   Lynden AngMasoud Hejazi, MD Referring Physician Psychiatry  10/09/16    Comment: she actually sees Oneta RackOBYN BRIDGES, NP who manages her medications     Wt Readings from Last 3 Encounters:  10/09/16 119 lb (54 kg)  09/28/16 121 lb 6.4 oz (55.1 kg)  05/01/16 119 lb 0.8 oz (54 kg)   BP Readings from Last 3 Encounters:  10/09/16 132/84  09/28/16 110/70  07/24/16 122/74   Pulse Readings from Last 3 Encounters:  10/09/16 (!) 109  09/28/16 (!) 103  07/24/16 (!) 110   BMI Readings from Last 3 Encounters:  10/09/16 21.42 kg/m  09/28/16 22.06 kg/m  05/01/16 21.63 kg/m   No results found for: HGBA1C  Patient Active Problem List   Diagnosis Date Noted  . Bunion of great toe of left foot 10/09/2016  . IBS (irritable bowel syndrome) 10/09/2016  . Bipolar disorder with psychotic features (HCC) 10/09/2016  . Perimenopause- on Xulane 10/09/2016  . Insomnia disorder 10/09/2016  . Moderate persistent asthma-  09/28/2016  . Allergic rhinoconjunctivitis 09/28/2016  . Dysphagia 09/28/2016  . Constipation 01/03/2015  . Internal bleeding  hemorrhoids 01/03/2015  . Anal bleeding 01/03/2015     Past Medical History:  Diagnosis Date  . Allergy    SEASONAL  . Anxiety   . Asthma    AS A CHILD  . Depression   . IBS (irritable bowel syndrome)   . Seasonal allergies      Past Surgical History:  Procedure Laterality Date  . BUNIONECTOMY Right   . COLPOSCOPY       Family History  Problem Relation Age of Onset  . Anxiety disorder Mother   . Depression Mother   . Allergic rhinitis Father   . Allergic rhinitis Paternal Aunt   . Allergic rhinitis Paternal Grandmother   . Cancer Paternal Grandmother     stomach  . Heart attack Maternal Grandfather   . Angioedema Neg Hx   . Asthma Neg Hx   . Atopy Neg Hx   . Eczema Neg Hx   . Immunodeficiency Neg Hx   . Urticaria Neg Hx      History  Drug Use No    History  Alcohol Use No    History  Smoking Status  . Never Smoker  Smokeless Tobacco  . Never Used    Patient's Medications  New Prescriptions   No medications on file  Previous Medications   ALBUTEROL (PROAIR HFA) 108 (90 BASE) MCG/ACT INHALER    Inhale 2 puffs into the lungs every 4 (four) hours as needed for wheezing or shortness of breath.   BECLOMETHASONE (QVAR) 80 MCG/ACT INHALER    Inhale 2 puffs into the lungs 2 (two) times daily.   CLONAZEPAM (KLONOPIN) 0.5 MG TABLET    Take 0.5 mg by mouth as needed for anxiety.   EPINEPHRINE (EPIPEN 2-PAK) 0.3 MG/0.3 ML IJ SOAJ INJECTION    Inject 0.3 mLs (0.3 mg total) into the muscle once.   FLUTICASONE (FLONASE) 50 MCG/ACT NASAL SPRAY    Use 1-2 sprays each nostril each morning.   HYOSCYAMINE (LEVSIN SL) 0.125 MG SL TABLET    PLACE 1 TABLET UNDER TONGUE EVERY 6 HOURS AS NEEDED   INULIN (FIBER CHOICE FRUITY BITES) 1.5 G CHEW    Chew 2 Units by mouth daily.   MONTELUKAST (SINGULAIR) 5 MG CHEWABLE TABLET    Take two tablets each evening.   NONFORMULARY OR COMPOUNDED ITEM    [facility]: Antiinflammatory Cream - Diclofenac 3%, Baclofen 2%, Lidocaine  2%, apply 1-2 grams to affected area 3-4 times daily.   PROBIOTIC PRODUCT (PROBIOTIC DAILY PO)    Take 1 tablet by mouth daily.   SPACER/AERO-HOLDING CHAMBERS (AEROCHAMBER PLUS FLO-VU LARGE) MISC    Use with MDI as directed.   TRAZODONE (DESYREL) 150 MG TABLET    Take 150 mg by mouth at bedtime.   XULANE 150-35 MCG/24HR TRANSDERMAL PATCH    Place 1 patch onto the skin. One a week for the weeks then one week off every month   ZIPRASIDONE (GEODON) 40 MG CAPSULE    Take 40 mg by mouth as needed.   Modified Medications   No medications on file  Discontinued Medications   CHOLECALCIFEROL (VITAMIN D) 1000 UNITS TABLET    Take 1,000 Units by mouth daily.   KETOCONAZOLE (NIZORAL) 2 % CREAM    Apply topically daily.   PROBIOTIC PRODUCT (ALIGN) 4 MG CAPS    Take 1 capsule by mouth daily. Or Kefir Yogurt daily   VITAMIN B-12 (CYANOCOBALAMIN) 1000 MCG TABLET    Take 1,000 mcg by mouth daily.    Allergies: Review of patient's allergies indicates no known allergies.  Review of Systems  Constitutional: Negative.  Negative for chills, diaphoresis, fever, malaise/fatigue and weight loss.  HENT: Negative.  Negative for congestion, sore throat and tinnitus.   Eyes: Negative.  Negative for blurred vision, double vision and photophobia.  Respiratory: Negative.  Negative for cough, shortness of breath and wheezing.   Cardiovascular: Negative.  Negative for chest pain and palpitations.  Gastrointestinal: Negative.  Negative for blood in stool, diarrhea, nausea and vomiting.  Genitourinary: Negative.  Negative for dysuria, flank pain, frequency, hematuria and urgency.  Musculoskeletal: Negative.  Negative for joint pain and myalgias.  Skin: Negative.  Negative for itching and rash.  Neurological: Negative.  Negative for dizziness, focal weakness, weakness and headaches.  Endo/Heme/Allergies: Negative.  Negative for environmental allergies and polydipsia. Does not bruise/bleed easily.  Psychiatric/Behavioral:  Positive for depression. Negative for hallucinations, memory loss, substance abuse and suicidal ideas. The patient is nervous/anxious and has insomnia.      Objective:   Blood  pressure 132/84, pulse (!) 109, height 5' 2.5" (1.588 m), weight 119 lb (54 kg), last menstrual period 10/08/2016. Body mass index is 21.42 kg/m. General: Well Developed, well nourished, and in no acute distress.  Neuro: Alert and oriented x3, extra-ocular muscles intact, sensation grossly intact.  HEENT: Normocephalic, atraumatic, pupils equal round reactive to light, neck supple, no bruits Skin: no gross suspicious lesions or rashes  Cardiac: Regular rate and rhythm, no murmurs rubs or gallops.  Respiratory: Essentially clear to auscultation bilaterally. Not using accessory muscles, speaking in full sentences.  Abdominal: Soft, not grossly distended Musculoskeletal: Ambulates w/o diff, FROM * 4 ext.  Vasc: less 2 sec cap RF, warm and pink  Psych:  No HI/SI, judgement and insight good, Euthymic mood. Full Affect.

## 2016-10-09 NOTE — Assessment & Plan Note (Signed)
Sees gastroenterology regularly for this: K. Scherry RanVeena Nandigam , MD

## 2016-10-09 NOTE — Assessment & Plan Note (Signed)
Treated by psychiatry with Geodon and trazodone

## 2016-10-09 NOTE — Assessment & Plan Note (Signed)
T[facility] Delorse LekPadgett[facility]

## 2016-10-09 NOTE — Assessment & Plan Note (Signed)
sees asthma and allergy specialists

## 2016-10-14 ENCOUNTER — Telehealth: Payer: Self-pay | Admitting: Gastroenterology

## 2016-10-14 MED ORDER — HYOSCYAMINE SULFATE 0.125 MG SL SUBL: SUBLINGUAL_TABLET | SUBLINGUAL | 3 refills | Status: DC

## 2016-10-14 MED FILL — OSCIMIN SL 0.125 MG TABLET: 0.125 | 15 days supply | Qty: 60 | Fill #0

## 2016-10-14 NOTE — Telephone Encounter (Signed)
Sent prescription to patients pharmacy electronically

## 2016-10-27 DIAGNOSIS — G4700 Insomnia, unspecified: Secondary | ICD-10-CM | POA: Diagnosis not present

## 2016-10-27 DIAGNOSIS — F411 Generalized anxiety disorder: Secondary | ICD-10-CM | POA: Diagnosis not present

## 2016-10-27 DIAGNOSIS — F319 Bipolar disorder, unspecified: Secondary | ICD-10-CM | POA: Diagnosis not present

## 2016-11-03 MED FILL — NYSTATIN 100,000 UNITS/GM O: 100000 | 30 days supply | Qty: 30 | Fill #0

## 2016-11-10 DIAGNOSIS — F319 Bipolar disorder, unspecified: Secondary | ICD-10-CM | POA: Diagnosis not present

## 2016-11-10 DIAGNOSIS — F411 Generalized anxiety disorder: Secondary | ICD-10-CM | POA: Diagnosis not present

## 2016-11-10 DIAGNOSIS — G4700 Insomnia, unspecified: Secondary | ICD-10-CM | POA: Diagnosis not present

## 2016-11-17 ENCOUNTER — Other Ambulatory Visit: Payer: Self-pay | Admitting: *Deleted

## 2016-11-17 MED ORDER — MONTELUKAST SODIUM 5 MG PO CHEW: CHEWABLE_TABLET | ORAL | 5 refills | Status: DC

## 2016-11-17 MED FILL — MONTELUKAST SOD 5 MG TAB CH: 5 | 30 days supply | Qty: 60 | Fill #0

## 2016-12-04 ENCOUNTER — Ambulatory Visit (INDEPENDENT_AMBULATORY_CARE_PROVIDER_SITE_OTHER): Payer: 59 | Admitting: Gastroenterology

## 2016-12-04 ENCOUNTER — Encounter: Payer: Self-pay | Admitting: Gastroenterology

## 2016-12-04 VITALS — BP 110/80 | HR 100 | Ht 62.5 in | Wt 121.5 lb

## 2016-12-04 DIAGNOSIS — K219 Gastro-esophageal reflux disease without esophagitis: Secondary | ICD-10-CM | POA: Diagnosis not present

## 2016-12-04 DIAGNOSIS — K582 Mixed irritable bowel syndrome: Secondary | ICD-10-CM | POA: Diagnosis not present

## 2016-12-04 MED ORDER — RANITIDINE HCL 150 MG PO TABS: 150.0000 mg | ORAL_TABLET | Freq: Every day | ORAL | 3 refills | Status: DC

## 2016-12-04 MED FILL — raNITIdine HCL 150 MG TABS: 150 | 30 days supply | Qty: 30 | Fill #0

## 2016-12-04 NOTE — Patient Instructions (Signed)
We will send Zantac to your pharmacy  Take fiber gummies with lunch  Take probiotic in the morning with breakfast     Gastroesophageal Reflux Disease, Adult Normally, food travels down the esophagus and stays in the stomach to be digested. However, when a person has gastroesophageal reflux disease (GERD), food and stomach acid move back up into the esophagus. When this happens, the esophagus becomes sore and inflamed. Over time, GERD can create small holes (ulcers) in the lining of the esophagus. What are the causes? This condition is caused by a problem with the muscle between the esophagus and the stomach (lower esophageal sphincter, or LES). Normally, the LES muscle closes after food passes through the esophagus to the stomach. When the LES is weakened or abnormal, it does not close properly, and that allows food and stomach acid to go back up into the esophagus. The LES can be weakened by certain dietary substances, medicines, and medical conditions, including:  Tobacco use.  Pregnancy.  Having a hiatal hernia.  Heavy alcohol use.  Certain foods and beverages, such as coffee, chocolate, onions, and peppermint. What increases the risk? This condition is more likely to develop in:  People who have an increased body weight.  People who have connective tissue disorders.  People who use NSAID medicines. What are the signs or symptoms? Symptoms of this condition include:  Heartburn.  Difficult or painful swallowing.  The feeling of having a lump in the throat.  Abitter taste in the mouth.  Bad breath.  Having a large amount of saliva.  Having an upset or bloated stomach.  Belching.  Chest pain.  Shortness of breath or wheezing.  Ongoing (chronic) cough or a night-time cough.  Wearing away of tooth enamel.  Weight loss. Different conditions can cause chest pain. Make sure to see your health care provider if you experience chest pain. How is this  diagnosed? Your health care provider will take a medical history and perform a physical exam. To determine if you have mild or severe GERD, your health care provider may also monitor how you respond to treatment. You may also have other tests, including:  An endoscopy toexamine your stomach and esophagus with a small camera.  A test thatmeasures the acidity level in your esophagus.  A test thatmeasures how much pressure is on your esophagus.  A barium swallow or modified barium swallow to show the shape, size, and functioning of your esophagus. How is this treated? The goal of treatment is to help relieve your symptoms and to prevent complications. Treatment for this condition may vary depending on how severe your symptoms are. Your health care provider may recommend:  Changes to your diet.  Medicine.  Surgery. Follow these instructions at home: Diet  Follow a diet as recommended by your health care provider. This may involve avoiding foods and drinks such as:  Coffee and tea (with or without caffeine).  Drinks that containalcohol.  Energy drinks and sports drinks.  Carbonated drinks or sodas.  Chocolate and cocoa.  Peppermint and mint flavorings.  Garlic and onions.  Horseradish.  Spicy and acidic foods, including peppers, chili powder, curry powder, vinegar, hot sauces, and barbecue sauce.  Citrus fruit juices and citrus fruits, such as oranges, lemons, and limes.  Tomato-based foods, such as red sauce, chili, salsa, and pizza with red sauce.  Fried and fatty foods, such as donuts, french fries, potato chips, and high-fat dressings.  High-fat meats, such as hot dogs and fatty cuts of red  and white meats, such as rib eye steak, sausage, ham, and bacon.  High-fat dairy items, such as whole milk, butter, and cream cheese.  Eat small, frequent meals instead of large meals.  Avoid drinking large amounts of liquid with your meals.  Avoid eating meals during the  2-3 hours before bedtime.  Avoid lying down right after you eat.  Do not exercise right after you eat. General instructions  Pay attention to any changes in your symptoms.  Take over-the-counter and prescription medicines only as told by your health care provider. Do not take aspirin, ibuprofen, or other NSAIDs unless your health care provider told you to do so.  Do not use any tobacco products, including cigarettes, chewing tobacco, and e-cigarettes. If you need help quitting, ask your health care provider.  Wear loose-fitting clothing. Do not wear anything tight around your waist that causes pressure on your abdomen.  Raise (elevate) the head of your bed 6 inches (15cm).  Try to reduce your stress, such as with yoga or meditation. If you need help reducing stress, ask your health care provider.  If you are overweight, reduce your weight to an amount that is healthy for you. Ask your health care provider for guidance about a safe weight loss goal.  Keep all follow-up visits as told by your health care provider. This is important. Contact a health care provider if:  You have new symptoms.  You have unexplained weight loss.  You have difficulty swallowing, or it hurts to swallow.  You have wheezing or a persistent cough.  Your symptoms do not improve with treatment.  You have a hoarse voice. Get help right away if:  You have pain in your arms, neck, jaw, teeth, or back.  You feel sweaty, dizzy, or light-headed.  You have chest pain or shortness of breath.  You vomit and your vomit looks like blood or coffee grounds.  You faint.  Your stool is bloody or black.  You cannot swallow, drink, or eat. This information is not intended to replace advice given to you by your health care provider. Make sure you discuss any questions you have with your health care provider. Document Released: 09/23/2005 Document Revised: 05/13/2016 Document Reviewed: 04/10/2015 Elsevier  Interactive Patient Education  2017 [employer]

## 2016-12-04 NOTE — Progress Notes (Signed)
Shelia HooverKimberly D Ramos    [id number]    August 22, 1971  Primary Care Physician:Deborah Opalski, DO  Referring Physician: Thomasene Loteborah Opalski, DO [address] [place] [address]  Chief complaint:  IBS with alternating constipation and diarrhea, heartburn  HPI: 45 year old female with history of irritable bowel syndrome here with complaints of alternating constipation and diarrhea associated with bloating and abdominal cramps that have improved to a large extent with change in diet and probiotics. She is currently having 3-4 bowel movements a week and is having increased bowel frequency with 2 or 3 bowel movements a day about once every 1- 2 weeks. Denies any nausea or vomiting. He is currently taking fiber choice gummy's and children's probiotic at bedtime. Patient complained of intermittent heartburn with regurgitation and cough that wakes her up from sleep about once a week for past few months , takes Alka-Seltzer which helps. She is taking hyoscyamine at bedtime with significant improvement of abdominal cramps. Denies any dysphagia or odynophagia. Denies any nausea, vomiting, abdominal pain, melena or bright red blood per rectum   Outpatient Encounter Prescriptions as of 12/04/2016  Medication Sig  . albuterol (PROAIR HFA) 108 (90 Base) MCG/ACT inhaler Inhale 2 puffs into the lungs every 4 (four) hours as needed for wheezing or shortness of breath.  . beclomethasone (QVAR) 80 MCG/ACT inhaler Inhale 2 puffs into the lungs 2 (two) times daily.  . clonazePAM (KLONOPIN) 0.5 MG tablet Take 0.5 mg by mouth as needed for anxiety.  Marland Kitchen. EPINEPHrine (EPIPEN 2-PAK) 0.3 mg/0.3 mL IJ SOAJ injection Inject 0.3 mLs (0.3 mg total) into the muscle once.  . fluticasone (FLONASE) 50 MCG/ACT nasal spray Use 1-2 sprays each nostril each morning.  . hyoscyamine (LEVSIN SL) 0.125 MG SL tablet PLACE 1 TABLET UNDER TONGUE EVERY 6 HOURS AS NEEDED  . Inulin (FIBER CHOICE FRUITY BITES) 1.5 g CHEW Chew 2  Units by mouth daily.  . montelukast (SINGULAIR) 5 MG chewable tablet Take two tablets each evening.  . NONFORMULARY OR COMPOUNDED ITEM [facility]: Antiinflammatory Cream - Diclofenac 3%, Baclofen 2%, Lidocaine 2%, apply 1-2 grams to affected area 3-4 times daily.  . Probiotic Product (PROBIOTIC DAILY PO) Take 1 tablet by mouth daily.  Marland Kitchen. Spacer/Aero-Holding Chambers (AEROCHAMBER PLUS FLO-VU LARGE) MISC Use with MDI as directed.  . traZODone (DESYREL) 150 MG tablet Take 150 mg by mouth at bedtime.  [facility]. XULANE 150-35 MCG/24HR transdermal patch Place 1 patch onto the skin. One a week for the weeks then one week off every month  . ziprasidone (GEODON) 40 MG capsule Take 40 mg by mouth as needed.    No facility-administered encounter medications on file as of 12/04/2016.     Allergies as of 12/04/2016  . (No Known Allergies)    Past Medical History:  Diagnosis Date  . Allergy    SEASONAL  . Anxiety   . Asthma    AS A CHILD  . Depression   . IBS (irritable bowel syndrome)   . Seasonal allergies     Past Surgical History:  Procedure Laterality Date  . BUNIONECTOMY Right   . COLPOSCOPY      Family History  Problem Relation Age of Onset  . Anxiety disorder Mother   . Depression Mother   . Allergic rhinitis Father   . Allergic rhinitis Paternal Aunt   . Allergic rhinitis Paternal Grandmother   . Cancer Paternal Grandmother     stomach  . Heart attack  Maternal Grandfather   . Angioedema Neg Hx   . Asthma Neg Hx   . Atopy Neg Hx   . Eczema Neg Hx   . Immunodeficiency Neg Hx   . Urticaria Neg Hx     Social History   Social History  . Marital status: Married    Spouse name: N/A  . Number of children: N/A  . Years of education: N/A   Occupational History  . Not on file.   Social History Main Topics  . Smoking status: Never Smoker  . Smokeless tobacco: Never Used  . Alcohol use No  . Drug use: No  . Sexual activity: Yes    Birth control/ protection: Patch    Other Topics Concern  . Not on file   Social History Narrative  . No narrative on file      Review of systems: Review of Systems  Constitutional: Negative for fever and chills.  HENT: Negative.   Eyes: Negative for blurred vision.  Respiratory: Negative for cough, shortness of breath and wheezing.   Cardiovascular: Negative for chest pain and palpitations.  Gastrointestinal: as per HPI Genitourinary: Negative for dysuria, urgency, frequency and hematuria.  Musculoskeletal: Negative for myalgias, back pain and joint pain.  Skin: Negative for itching and rash.  Neurological: Negative for dizziness, tremors, focal weakness, seizures and loss of consciousness.  Endo/Heme/Allergies: Positive for seasonal allergies.  Psychiatric/Behavioral: Negative for depression, suicidal ideas and hallucinations.  positive for anxiety and insomnia All other systems reviewed and are negative.   Physical Exam: Vitals:   12/04/16 0846  BP: 110/80  Pulse: 100   Body mass index is 21.87 kg/m. Gen:      No acute distress HEENT:  EOMI, sclera anicteric Neck:     No masses; no thyromegaly Lungs:    Clear to auscultation bilaterally; normal respiratory effort CV:         Regular rate and rhythm; no murmurs Abd:      + bowel sounds; soft, non-tender; no palpable masses, no distension Ext:    No edema; adequate peripheral perfusion Skin:      Warm and dry; no rash Neuro: alert and oriented x 3 Psych: normal mood and affect  Data Reviewed:  Reviewed labs, radiology imaging, old records and pertinent past GI work up   Assessment and Plan/Recommendations: 45 year old female with history of irritable bowel syndrome with alternating constipation and diarrhea here for follow-up IBS: Continue with dietary changes Advised patient to take fiber gummy's with lunch and take probiotics in the morning with breakfast Continue hyoscyamine at bedtime as needed  GERD: Patient is currently having episodes  about once a week mostly at bedtime We'll do a trial of H2 blocker Zantac 150 mg at bedtime as needed Antireflux measures Return in 1 year or sooner if needed 25 minutes was spent face-to-face with the patient. Greater than 50% of the time used for counseling as well as treatment plan and follow-up. She had multiple questions which were answered to her satisfaction  K. Scherry RanVeena Nandigam , MD [phone] Mon-Fri 8a-5p [phone] after 5p, weekends, holidays  CC: Thomasene Lotpalski, Deborah, DO

## 2016-12-17 MED FILL — OSCIMIN SL 0.125 MG TABLET: 0.125 | 15 days supply | Qty: 60 | Fill #1

## 2016-12-17 MED FILL — MONTELUKAST SOD 5 MG TAB CH: 5 | 30 days supply | Qty: 60 | Fill #1

## 2016-12-23 MED FILL — XULANE PATCH: 150-35 | 84 days supply | Qty: 9 | Fill #1

## 2017-01-05 DIAGNOSIS — F411 Generalized anxiety disorder: Secondary | ICD-10-CM | POA: Diagnosis not present

## 2017-01-05 DIAGNOSIS — G4700 Insomnia, unspecified: Secondary | ICD-10-CM | POA: Diagnosis not present

## 2017-01-05 DIAGNOSIS — F319 Bipolar disorder, unspecified: Secondary | ICD-10-CM | POA: Diagnosis not present

## 2017-01-08 DIAGNOSIS — M4146 Neuromuscular scoliosis, lumbar region: Secondary | ICD-10-CM | POA: Diagnosis not present

## 2017-01-08 DIAGNOSIS — M79644 Pain in right finger(s): Secondary | ICD-10-CM | POA: Diagnosis not present

## 2017-01-08 DIAGNOSIS — M9905 Segmental and somatic dysfunction of pelvic region: Secondary | ICD-10-CM | POA: Diagnosis not present

## 2017-01-08 DIAGNOSIS — M9902 Segmental and somatic dysfunction of thoracic region: Secondary | ICD-10-CM | POA: Diagnosis not present

## 2017-01-08 DIAGNOSIS — M79642 Pain in left hand: Secondary | ICD-10-CM | POA: Diagnosis not present

## 2017-01-08 DIAGNOSIS — M5032 Other cervical disc degeneration, mid-cervical region, unspecified level: Secondary | ICD-10-CM | POA: Diagnosis not present

## 2017-01-08 DIAGNOSIS — M9901 Segmental and somatic dysfunction of cervical region: Secondary | ICD-10-CM | POA: Diagnosis not present

## 2017-01-08 DIAGNOSIS — M79641 Pain in right hand: Secondary | ICD-10-CM | POA: Diagnosis not present

## 2017-01-08 DIAGNOSIS — M9903 Segmental and somatic dysfunction of lumbar region: Secondary | ICD-10-CM | POA: Diagnosis not present

## 2017-01-12 ENCOUNTER — Ambulatory Visit (INDEPENDENT_AMBULATORY_CARE_PROVIDER_SITE_OTHER): Payer: 59 | Admitting: Sports Medicine

## 2017-01-12 ENCOUNTER — Encounter: Payer: Self-pay | Admitting: Sports Medicine

## 2017-01-12 DIAGNOSIS — M2011 Hallux valgus (acquired), right foot: Secondary | ICD-10-CM

## 2017-01-12 DIAGNOSIS — M21611 Bunion of right foot: Secondary | ICD-10-CM | POA: Diagnosis not present

## 2017-01-12 DIAGNOSIS — M79642 Pain in left hand: Secondary | ICD-10-CM | POA: Diagnosis not present

## 2017-01-12 DIAGNOSIS — M79644 Pain in right finger(s): Secondary | ICD-10-CM | POA: Diagnosis not present

## 2017-01-12 DIAGNOSIS — M4146 Neuromuscular scoliosis, lumbar region: Secondary | ICD-10-CM | POA: Diagnosis not present

## 2017-01-12 DIAGNOSIS — M79671 Pain in right foot: Secondary | ICD-10-CM

## 2017-01-12 DIAGNOSIS — M9902 Segmental and somatic dysfunction of thoracic region: Secondary | ICD-10-CM | POA: Diagnosis not present

## 2017-01-12 DIAGNOSIS — M79641 Pain in right hand: Secondary | ICD-10-CM | POA: Diagnosis not present

## 2017-01-12 DIAGNOSIS — M9903 Segmental and somatic dysfunction of lumbar region: Secondary | ICD-10-CM | POA: Diagnosis not present

## 2017-01-12 DIAGNOSIS — M779 Enthesopathy, unspecified: Secondary | ICD-10-CM

## 2017-01-12 DIAGNOSIS — M5032 Other cervical disc degeneration, mid-cervical region, unspecified level: Secondary | ICD-10-CM | POA: Diagnosis not present

## 2017-01-12 DIAGNOSIS — M9901 Segmental and somatic dysfunction of cervical region: Secondary | ICD-10-CM | POA: Diagnosis not present

## 2017-01-12 DIAGNOSIS — M9905 Segmental and somatic dysfunction of pelvic region: Secondary | ICD-10-CM | POA: Diagnosis not present

## 2017-01-12 MED ORDER — TRIAMCINOLONE ACETONIDE 40 MG/ML IJ SUSP: 20.0000 mg | Freq: Once | INTRAMUSCULAR | Status: DC

## 2017-01-12 NOTE — Progress Notes (Signed)
Patient ID: Shelia Ramos, female   DOB: 1971-10-17, 46 y.o.   MRN: [id number] Subjective: Shelia Ramos is a 46 y.o. female patient who returns to office for evaluation of Right> Left bunion pain. Patient complains of continued bunion pain even after trying toe splint and topical rub; desires to try injection. Patient denies any other pedal complaints.   Patient Active Problem List   Diagnosis Date Noted  . Bunion of great toe of left foot 10/09/2016  . IBS (irritable bowel syndrome) 10/09/2016  . Bipolar disorder with psychotic features (HCC) 10/09/2016  . Perimenopause- on Xulane 10/09/2016  . Insomnia disorder 10/09/2016  . Moderate persistent asthma-  09/28/2016  . Allergic rhinoconjunctivitis 09/28/2016  . Dysphagia 09/28/2016  . Constipation 01/03/2015  . Internal bleeding hemorrhoids 01/03/2015  . Anal bleeding 01/03/2015    Current Outpatient Prescriptions on File Prior to Visit  Medication Sig Dispense Refill  . albuterol (PROAIR HFA) 108 (90 Base) MCG/ACT inhaler Inhale 2 puffs into the lungs every 4 (four) hours as needed for wheezing or shortness of breath. 1 Inhaler 1  . beclomethasone (QVAR) 80 MCG/ACT inhaler Inhale 2 puffs into the lungs 2 (two) times daily. 1 Inhaler 5  . clonazePAM (KLONOPIN) 0.5 MG tablet Take 0.5 mg by mouth as needed for anxiety.    Marland Kitchen. EPINEPHrine (EPIPEN 2-PAK) 0.3 mg/0.3 mL IJ SOAJ injection Inject 0.3 mLs (0.3 mg total) into the muscle once. 2 Device 2  . fluticasone (FLONASE) 50 MCG/ACT nasal spray Use 1-2 sprays each nostril each morning. 1 g 5  . hyoscyamine (LEVSIN SL) 0.125 MG SL tablet PLACE 1 TABLET UNDER TONGUE EVERY 6 HOURS AS NEEDED 60 tablet 3  . Inulin (FIBER CHOICE FRUITY BITES) 1.5 g CHEW Chew 2 Units by mouth daily.    . montelukast (SINGULAIR) 5 MG chewable tablet Take two tablets each evening. 60 tablet 5  . NONFORMULARY OR COMPOUNDED ITEM [facility]: Antiinflammatory Cream - Diclofenac 3%, Baclofen 2%, Lidocaine  2%, apply 1-2 grams to affected area 3-4 times daily. 480 each 11  . Probiotic Product (PROBIOTIC DAILY PO) Take 1 tablet by mouth daily.    . ranitidine (ZANTAC) 150 MG tablet Take 1 tablet (150 mg total) by mouth at bedtime. 30 tablet 3  . Spacer/Aero-Holding Chambers (AEROCHAMBER PLUS FLO-VU LARGE) MISC Use with MDI as directed. 1 each 1  . traZODone (DESYREL) 150 MG tablet Take 150 mg by mouth at bedtime.    [facility]. XULANE 150-35 MCG/24HR transdermal patch Place 1 patch onto the skin. One a week for the weeks then one week off every month    . ziprasidone (GEODON) 40 MG capsule Take 40 mg by mouth as needed.      No current facility-administered medications on file prior to visit.     No Known Allergies  Objective:  General: Alert and oriented x3 in no acute distress  Dermatology: No open lesions bilateral lower extremities, no webspace macerations, no ecchymosis bilateral, all nails x 10 are well manicured.  Vascular: Dorsalis Pedis and Posterior Tibial pedal pulses 2/4, Capillary Fill Time 3 seconds, (+) pedal hair growth bilateral, no edema bilateral lower extremities, Temperature gradient within normal limits.  Neurology: Gross sensation intact via light touch bilateral, Protective sensation intact  with Semmes Weinstein Monofilament to all pedal sites, Position sense intact, vibratory intact bilateral, Deep tendon reflexes within normal limits bilateral, No babinski sign present bilateral. (-) Tinels sign bilateral.  Musculoskeletal: Mild tenderness with palpation right>left bunion deformity, no limitation  or crepitus with range of motion, deformity reducible, tracking not trackbound, there is early crossover of hallux on right, residual mild 1st ray hypermobility noted bilateral. Midtarsal, Subtalar joint, and ankle joint range of motion is within normal limits. On weightbearing exam, forefoot slight abduction with HAV deformity supported on ground with early second toe crossover deformity  noted right>left and hammertoe deformity.       Assessment and Plan: Problem List Items Addressed This Visit    None    Visit Diagnoses    Hallux valgus with bunions, right    -  Primary   Capsulitis       Relevant Medications   triamcinolone acetonide (KENALOG-40) injection 20 mg   Right foot pain          -Complete examination performed -Previous Xrays reviewed -Discussed treatement options; discussed HAV deformity;conservative and  Surgical management; risks, benefits, alternatives discussed. All patient's questions answered. -Patient opt for continued conservative care at this time -After oral consent and aseptic prep, injected a mixture containing 1 ml of 2% plain lidocaine, 1 ml 0.5% plain marcaine, 0.5 ml of kenalog 40 and 0.5 ml of dexamethasone phosphate into -right 1st MTPJ at bunion without complication. Post-injection care discussed with patient.  -Continue with [facility] topical -Continue with [employer] alignment toe splint -Recommend continue with good supportive shoes and OTC inserts.  -Recommend icing and Epsom salt soaks as needed -Patient to return to office as needed or sooner if condition worsens.  Asencion Islam, DPM

## 2017-01-18 DIAGNOSIS — M9901 Segmental and somatic dysfunction of cervical region: Secondary | ICD-10-CM | POA: Diagnosis not present

## 2017-01-18 DIAGNOSIS — M4146 Neuromuscular scoliosis, lumbar region: Secondary | ICD-10-CM | POA: Diagnosis not present

## 2017-01-18 DIAGNOSIS — M9903 Segmental and somatic dysfunction of lumbar region: Secondary | ICD-10-CM | POA: Diagnosis not present

## 2017-01-18 DIAGNOSIS — M79644 Pain in right finger(s): Secondary | ICD-10-CM | POA: Diagnosis not present

## 2017-01-18 DIAGNOSIS — M79642 Pain in left hand: Secondary | ICD-10-CM | POA: Diagnosis not present

## 2017-01-18 DIAGNOSIS — M9902 Segmental and somatic dysfunction of thoracic region: Secondary | ICD-10-CM | POA: Diagnosis not present

## 2017-01-18 DIAGNOSIS — F411 Generalized anxiety disorder: Secondary | ICD-10-CM | POA: Diagnosis not present

## 2017-01-18 DIAGNOSIS — M9905 Segmental and somatic dysfunction of pelvic region: Secondary | ICD-10-CM | POA: Diagnosis not present

## 2017-01-18 DIAGNOSIS — G4700 Insomnia, unspecified: Secondary | ICD-10-CM | POA: Diagnosis not present

## 2017-01-18 DIAGNOSIS — M5032 Other cervical disc degeneration, mid-cervical region, unspecified level: Secondary | ICD-10-CM | POA: Diagnosis not present

## 2017-01-18 DIAGNOSIS — F319 Bipolar disorder, unspecified: Secondary | ICD-10-CM | POA: Diagnosis not present

## 2017-01-18 DIAGNOSIS — M79641 Pain in right hand: Secondary | ICD-10-CM | POA: Diagnosis not present

## 2017-01-21 MED FILL — MONTELUKAST SOD 5 MG TAB CH: 5 | 30 days supply | Qty: 60 | Fill #2

## 2017-01-22 DIAGNOSIS — M9901 Segmental and somatic dysfunction of cervical region: Secondary | ICD-10-CM | POA: Diagnosis not present

## 2017-01-22 DIAGNOSIS — M9903 Segmental and somatic dysfunction of lumbar region: Secondary | ICD-10-CM | POA: Diagnosis not present

## 2017-01-22 DIAGNOSIS — M9902 Segmental and somatic dysfunction of thoracic region: Secondary | ICD-10-CM | POA: Diagnosis not present

## 2017-01-22 DIAGNOSIS — M5032 Other cervical disc degeneration, mid-cervical region, unspecified level: Secondary | ICD-10-CM | POA: Diagnosis not present

## 2017-01-22 DIAGNOSIS — M79642 Pain in left hand: Secondary | ICD-10-CM | POA: Diagnosis not present

## 2017-01-22 DIAGNOSIS — M9905 Segmental and somatic dysfunction of pelvic region: Secondary | ICD-10-CM | POA: Diagnosis not present

## 2017-01-22 DIAGNOSIS — M79644 Pain in right finger(s): Secondary | ICD-10-CM | POA: Diagnosis not present

## 2017-01-22 DIAGNOSIS — M79641 Pain in right hand: Secondary | ICD-10-CM | POA: Diagnosis not present

## 2017-01-22 DIAGNOSIS — M4146 Neuromuscular scoliosis, lumbar region: Secondary | ICD-10-CM | POA: Diagnosis not present

## 2017-01-25 DIAGNOSIS — F411 Generalized anxiety disorder: Secondary | ICD-10-CM | POA: Diagnosis not present

## 2017-01-25 DIAGNOSIS — M79644 Pain in right finger(s): Secondary | ICD-10-CM | POA: Diagnosis not present

## 2017-01-25 DIAGNOSIS — G4700 Insomnia, unspecified: Secondary | ICD-10-CM | POA: Diagnosis not present

## 2017-01-25 DIAGNOSIS — F319 Bipolar disorder, unspecified: Secondary | ICD-10-CM | POA: Diagnosis not present

## 2017-01-25 DIAGNOSIS — M79642 Pain in left hand: Secondary | ICD-10-CM | POA: Diagnosis not present

## 2017-01-25 DIAGNOSIS — M5032 Other cervical disc degeneration, mid-cervical region, unspecified level: Secondary | ICD-10-CM | POA: Diagnosis not present

## 2017-01-25 DIAGNOSIS — M9901 Segmental and somatic dysfunction of cervical region: Secondary | ICD-10-CM | POA: Diagnosis not present

## 2017-01-25 DIAGNOSIS — M9902 Segmental and somatic dysfunction of thoracic region: Secondary | ICD-10-CM | POA: Diagnosis not present

## 2017-01-25 DIAGNOSIS — M9905 Segmental and somatic dysfunction of pelvic region: Secondary | ICD-10-CM | POA: Diagnosis not present

## 2017-01-25 DIAGNOSIS — M9903 Segmental and somatic dysfunction of lumbar region: Secondary | ICD-10-CM | POA: Diagnosis not present

## 2017-01-25 DIAGNOSIS — M4146 Neuromuscular scoliosis, lumbar region: Secondary | ICD-10-CM | POA: Diagnosis not present

## 2017-01-25 DIAGNOSIS — M79641 Pain in right hand: Secondary | ICD-10-CM | POA: Diagnosis not present

## 2017-02-02 DIAGNOSIS — F319 Bipolar disorder, unspecified: Secondary | ICD-10-CM | POA: Diagnosis not present

## 2017-02-02 DIAGNOSIS — F411 Generalized anxiety disorder: Secondary | ICD-10-CM | POA: Diagnosis not present

## 2017-02-02 DIAGNOSIS — Z1231 Encounter for screening mammogram for malignant neoplasm of breast: Secondary | ICD-10-CM | POA: Diagnosis not present

## 2017-02-02 DIAGNOSIS — Z01419 Encounter for gynecological examination (general) (routine) without abnormal findings: Secondary | ICD-10-CM | POA: Diagnosis not present

## 2017-02-02 DIAGNOSIS — G4700 Insomnia, unspecified: Secondary | ICD-10-CM | POA: Diagnosis not present

## 2017-02-05 MED FILL — DOXEPIN 75 MG CAPSULE: 75 | 90 days supply | Qty: 180 | Fill #0

## 2017-02-05 MED FILL — clonazePAM 0.5 MG TABS: 0.5 | 30 days supply | Qty: 30 | Fill #0

## 2017-02-05 MED FILL — ZIPRASIDONE HCL 80 MG CAP: 80 | 90 days supply | Qty: 90 | Fill #0

## 2017-02-08 DIAGNOSIS — F411 Generalized anxiety disorder: Secondary | ICD-10-CM | POA: Diagnosis not present

## 2017-02-08 DIAGNOSIS — G4700 Insomnia, unspecified: Secondary | ICD-10-CM | POA: Diagnosis not present

## 2017-02-08 DIAGNOSIS — F319 Bipolar disorder, unspecified: Secondary | ICD-10-CM | POA: Diagnosis not present

## 2017-02-16 MED FILL — OSCIMIN SL 0.125 MG TABLET: 0.125 | 15 days supply | Qty: 60 | Fill #2

## 2017-02-24 MED FILL — MONTELUKAST SOD 5 MG TAB CH: 5 | 30 days supply | Qty: 60 | Fill #3

## 2017-03-01 DIAGNOSIS — F411 Generalized anxiety disorder: Secondary | ICD-10-CM | POA: Diagnosis not present

## 2017-03-01 DIAGNOSIS — G4700 Insomnia, unspecified: Secondary | ICD-10-CM | POA: Diagnosis not present

## 2017-03-01 DIAGNOSIS — F319 Bipolar disorder, unspecified: Secondary | ICD-10-CM | POA: Diagnosis not present

## 2017-03-11 DIAGNOSIS — F411 Generalized anxiety disorder: Secondary | ICD-10-CM | POA: Diagnosis not present

## 2017-03-11 DIAGNOSIS — F319 Bipolar disorder, unspecified: Secondary | ICD-10-CM | POA: Diagnosis not present

## 2017-03-11 DIAGNOSIS — G4700 Insomnia, unspecified: Secondary | ICD-10-CM | POA: Diagnosis not present

## 2017-03-15 DIAGNOSIS — F411 Generalized anxiety disorder: Secondary | ICD-10-CM | POA: Diagnosis not present

## 2017-03-15 DIAGNOSIS — G4700 Insomnia, unspecified: Secondary | ICD-10-CM | POA: Diagnosis not present

## 2017-03-15 DIAGNOSIS — F319 Bipolar disorder, unspecified: Secondary | ICD-10-CM | POA: Diagnosis not present

## 2017-03-22 DIAGNOSIS — F411 Generalized anxiety disorder: Secondary | ICD-10-CM | POA: Diagnosis not present

## 2017-03-22 DIAGNOSIS — F319 Bipolar disorder, unspecified: Secondary | ICD-10-CM | POA: Diagnosis not present

## 2017-03-22 DIAGNOSIS — G4700 Insomnia, unspecified: Secondary | ICD-10-CM | POA: Diagnosis not present

## 2017-03-29 MED FILL — MONTELUKAST SOD 5 MG TAB CH: 5 | 30 days supply | Qty: 60 | Fill #4

## 2017-04-07 ENCOUNTER — Encounter: Payer: Self-pay | Admitting: Emergency Medicine

## 2017-04-07 ENCOUNTER — Emergency Department (INDEPENDENT_AMBULATORY_CARE_PROVIDER_SITE_OTHER): Payer: Worker's Compensation

## 2017-04-07 ENCOUNTER — Emergency Department (INDEPENDENT_AMBULATORY_CARE_PROVIDER_SITE_OTHER)
Admission: EM | Admit: 2017-04-07 | Discharge: 2017-04-07 | Disposition: A | Discharge status: Home / Self Care | Payer: Worker's Compensation | Source: Home / Self Care | Attending: Family Medicine | Admitting: Family Medicine

## 2017-04-07 DIAGNOSIS — S93401A Sprain of unspecified ligament of right ankle, initial encounter: Secondary | ICD-10-CM

## 2017-04-07 DIAGNOSIS — M25571 Pain in right ankle and joints of right foot: Secondary | ICD-10-CM

## 2017-04-07 DIAGNOSIS — M7989 Other specified soft tissue disorders: Secondary | ICD-10-CM

## 2017-04-07 DIAGNOSIS — Y990 Civilian activity done for income or pay: Secondary | ICD-10-CM

## 2017-04-07 DIAGNOSIS — S99911A Unspecified injury of right ankle, initial encounter: Secondary | ICD-10-CM | POA: Diagnosis not present

## 2017-04-07 DIAGNOSIS — W108XXA Fall (on) (from) other stairs and steps, initial encounter: Secondary | ICD-10-CM

## 2017-04-07 MED ORDER — IBUPROFEN 400 MG PO TABS: 400.0000 mg | ORAL_TABLET | Freq: Once | ORAL | Status: DC

## 2017-04-07 NOTE — ED Provider Notes (Signed)
CSN: [id number]     Arrival date & time 04/07/17  1659 History   First MD Initiated Contact with Patient 04/07/17 1700     Chief Complaint  Patient presents with  . Ankle Pain   (Consider location/radiation/quality/duration/timing/severity/associated sxs/prior Treatment) HPI Shelia Ramos is a 46 y.o. female presenting to UC with c/o sudden onset Right ankle pain, swelling and bruising to lateral aspect after fall going down stairs at work at [employer] around 12:30PM.  Denies hitting her head or other injuries. Denies prior injury or surgery to Right ankle. No pain medication taken PTA.    Past Medical History:  Diagnosis Date  . Allergy    SEASONAL  . Anxiety   . Asthma    AS A CHILD  . Depression   . IBS (irritable bowel syndrome)   . Seasonal allergies    Past Surgical History:  Procedure Laterality Date  . BUNIONECTOMY Right   . COLPOSCOPY     Family History  Problem Relation Age of Onset  . Anxiety disorder Mother   . Depression Mother   . Allergic rhinitis Father   . Allergic rhinitis Paternal Aunt   . Allergic rhinitis Paternal Grandmother   . Cancer Paternal Grandmother     stomach  . Heart attack Maternal Grandfather   . Angioedema Neg Hx   . Asthma Neg Hx   . Atopy Neg Hx   . Eczema Neg Hx   . Immunodeficiency Neg Hx   . Urticaria Neg Hx    Social History  Substance Use Topics  . Smoking status: Never Smoker  . Smokeless tobacco: Never Used  . Alcohol use No   OB History    No data available     Review of Systems  Musculoskeletal: Positive for arthralgias, gait problem, joint swelling and myalgias.       Right ankle  Skin: Positive for color change. Negative for wound.  Neurological: Negative for weakness and numbness.    Allergies  Patient has no known allergies.  Home Medications   Prior to Admission medications   Medication Sig Start Date End Date Taking? Authorizing Provider  albuterol (PROAIR HFA) 108 (90 Base)  MCG/ACT inhaler Inhale 2 puffs into the lungs every 4 (four) hours as needed for wheezing or shortness of breath. 05/01/16   Roselyn Kara Mead, MD  beclomethasone (QVAR) 80 MCG/ACT inhaler Inhale 2 puffs into the lungs 2 (two) times daily. 09/28/16   Shaylar Larose Hires, MD  clonazePAM (KLONOPIN) 0.5 MG tablet Take 0.5 mg by mouth as needed for anxiety.    Historical Provider, MD  EPINEPHrine (EPIPEN 2-PAK) 0.3 mg/0.3 mL IJ SOAJ injection Inject 0.3 mLs (0.3 mg total) into the muscle once. 05/01/16   Roselyn Kara Mead, MD  fluticasone (FLONASE) 50 MCG/ACT nasal spray Use 1-2 sprays each nostril each morning. 05/01/16   Roselyn Kara Mead, MD  hyoscyamine (LEVSIN SL) 0.125 MG SL tablet PLACE 1 TABLET UNDER TONGUE EVERY 6 HOURS AS NEEDED 10/14/16   Napoleon Form, MD  Inulin (FIBER CHOICE FRUITY BITES) 1.5 g CHEW Chew 2 Units by mouth daily.    Historical Provider, MD  montelukast (SINGULAIR) 5 MG chewable tablet Take two tablets each evening. 11/17/16   Jessica Priest, MD  NONFORMULARY OR COMPOUNDED ITEM [facility]: Antiinflammatory Cream - Diclofenac 3%, Baclofen 2%, Lidocaine 2%, apply 1-2 grams to affected area 3-4 times daily. 10/06/16   Asencion Islam, DPM  Probiotic Product (PROBIOTIC DAILY PO) Take 1 tablet by  mouth daily.    Historical Provider, MD  ranitidine (ZANTAC) 150 MG tablet Take 1 tablet (150 mg total) by mouth at bedtime. 12/04/16   Napoleon Form, MD  Spacer/Aero-Holding Chambers (AEROCHAMBER PLUS FLO-VU LARGE) MISC Use with MDI as directed. 07/26/16   Roselyn Kara Mead, MD  traZODone (DESYREL) 150 MG tablet Take 150 mg by mouth at bedtime.    Historical Provider, MD  Burr Medico 150-35 MCG/24HR transdermal patch Place 1 patch onto the skin. One a week for the weeks then one week off every month 01/27/16   Historical Provider, MD  ziprasidone (GEODON) 40 MG capsule Take 40 mg by mouth as needed.     Historical Provider, MD   Meds Ordered and Administered this Visit   Medications   ibuprofen (ADVIL,MOTRIN) tablet 400 mg (not administered)    BP 116/78 (BP Location: Left Arm)   Pulse (!) 126   Temp 98.6 F (37 C) (Oral)   Resp 18   Ht  (1.575 m)   Wt 123 lb (55.8 kg)   LMP 04/02/2017   SpO2 96%   BMI 22.50 kg/m  No data found.   Physical Exam  Constitutional: She is oriented to person, place, and time. She appears well-developed and well-nourished.  HENT:  Head: Normocephalic and atraumatic.  Eyes: EOM are normal.  Neck: Normal range of motion.  Cardiovascular: Normal rate.   Pulses:      Dorsalis pedis pulses are 2+ on the right side.  Pulmonary/Chest: Effort normal.  Musculoskeletal: She exhibits edema and tenderness.  Right ankle: moderate edema to lateral aspect. Tended. Decreased flexion and extension due to pain. Calf is soft, non-tender.   Neurological: She is alert and oriented to person, place, and time.  Skin: Skin is warm and dry. Capillary refill takes less than 2 seconds.  Right ankle: Skin in tact. Moderate ecchymosis noted to lateral aspect of ankle.   Psychiatric: She has a normal mood and affect. Her behavior is normal.  Nursing note and vitals reviewed.   Urgent Care Course     Procedures (including critical care time)  Labs Review Labs Reviewed - No data to display  Imaging Review Dg Ankle Complete Right  Result Date: 04/07/2017 CLINICAL DATA:  Larey Seat down stairs today, pain and swelling laterally EXAM: RIGHT ANKLE - COMPLETE 3+ VIEW COMPARISON:  None. FINDINGS: The ankle joint appears normal. Alignment is normal. There is soft tissue swelling over the lateral malleolus. No fracture is noted. IMPRESSION: No acute abnormality. Electronically Signed   By: Dwyane Dee M.D.   On: 04/07/2017 17:18     MDM   1. Moderate right ankle sprain, initial encounter   2. Right ankle injury, initial encounter   3. Fall down stairs, initial encounter   4. Work related injury    Right ankle pain, swelling and bruising after fall  on stairs at work. No other injuries.  Plain films: Negative for fracture.  Ace wrap applied at this time, crutches provided.  May use walking boot as swelling goes down to help need for crutches as pt is on feet often for work.    Work note provided for tomorrow off as injury occurred on Right ankle and is scheduled to work 1 hour from where she lives. Do not recommend pt driving tomorrow.   f/u with Occupational/Employee health by early next week for ongoing treatment of ankle sprain.    Junius Finner, PA-C 04/07/17 1800

## 2017-04-07 NOTE — ED Triage Notes (Signed)
Patient just fell and rolled her right ankle here at [facility]; it is already edematous, bruise forming, and hurts to bear weight. Put her in wheelchair; seen in hallway by her PCP, Dr.Corey and taken to xray.

## 2017-04-23 MED FILL — OSCIMIN SL 0.125 MG TABLET: 0.125 | 15 days supply | Qty: 60 | Fill #3

## 2017-04-26 DIAGNOSIS — F411 Generalized anxiety disorder: Secondary | ICD-10-CM | POA: Diagnosis not present

## 2017-04-26 DIAGNOSIS — G4700 Insomnia, unspecified: Secondary | ICD-10-CM | POA: Diagnosis not present

## 2017-04-26 DIAGNOSIS — F319 Bipolar disorder, unspecified: Secondary | ICD-10-CM | POA: Diagnosis not present

## 2017-04-27 DIAGNOSIS — G4700 Insomnia, unspecified: Secondary | ICD-10-CM | POA: Diagnosis not present

## 2017-04-27 DIAGNOSIS — F319 Bipolar disorder, unspecified: Secondary | ICD-10-CM | POA: Diagnosis not present

## 2017-04-27 DIAGNOSIS — F411 Generalized anxiety disorder: Secondary | ICD-10-CM | POA: Diagnosis not present

## 2017-04-29 DIAGNOSIS — G4700 Insomnia, unspecified: Secondary | ICD-10-CM | POA: Diagnosis not present

## 2017-04-29 DIAGNOSIS — F319 Bipolar disorder, unspecified: Secondary | ICD-10-CM | POA: Diagnosis not present

## 2017-04-29 DIAGNOSIS — F411 Generalized anxiety disorder: Secondary | ICD-10-CM | POA: Diagnosis not present

## 2017-04-30 ENCOUNTER — Encounter (HOSPITAL_COMMUNITY): Payer: Self-pay

## 2017-04-30 ENCOUNTER — Emergency Department (HOSPITAL_COMMUNITY)
Admission: EM | Admit: 2017-04-30 | Discharge: 2017-05-01 | Disposition: A | Discharge status: Other Acute Inpatient Hospital | Payer: 59 | Attending: Emergency Medicine | Admitting: Emergency Medicine

## 2017-04-30 DIAGNOSIS — Y999 Unspecified external cause status: Secondary | ICD-10-CM | POA: Insufficient documentation

## 2017-04-30 DIAGNOSIS — Y92009 Unspecified place in unspecified non-institutional (private) residence as the place of occurrence of the external cause: Secondary | ICD-10-CM | POA: Insufficient documentation

## 2017-04-30 DIAGNOSIS — F29 Unspecified psychosis not due to a substance or known physiological condition: Secondary | ICD-10-CM | POA: Diagnosis not present

## 2017-04-30 DIAGNOSIS — S6992XA Unspecified injury of left wrist, hand and finger(s), initial encounter: Secondary | ICD-10-CM | POA: Diagnosis present

## 2017-04-30 DIAGNOSIS — R440 Auditory hallucinations: Secondary | ICD-10-CM | POA: Diagnosis not present

## 2017-04-30 DIAGNOSIS — J45909 Unspecified asthma, uncomplicated: Secondary | ICD-10-CM | POA: Insufficient documentation

## 2017-04-30 DIAGNOSIS — Y939 Activity, unspecified: Secondary | ICD-10-CM | POA: Diagnosis not present

## 2017-04-30 DIAGNOSIS — Z79899 Other long term (current) drug therapy: Secondary | ICD-10-CM | POA: Diagnosis not present

## 2017-04-30 DIAGNOSIS — S61512A Laceration without foreign body of left wrist, initial encounter: Secondary | ICD-10-CM | POA: Diagnosis not present

## 2017-04-30 DIAGNOSIS — X781XXA Intentional self-harm by knife, initial encounter: Secondary | ICD-10-CM | POA: Insufficient documentation

## 2017-04-30 LAB — CBC
HEMATOCRIT: 39.3 % (ref 36.0–46.0)
HEMOGLOBIN: 13.5 g/dL (ref 12.0–15.0)
MCH: 30.1 pg (ref 26.0–34.0)
MCHC: 34.4 g/dL (ref 30.0–36.0)
MCV: 87.7 fL (ref 78.0–100.0)
Platelets: 302 10*3/uL (ref 150–400)
RBC: 4.48 MIL/uL (ref 3.87–5.11)
RDW: 12.5 % (ref 11.5–15.5)
WBC: 8.4 10*3/uL (ref 4.0–10.5)

## 2017-04-30 LAB — COMPREHENSIVE METABOLIC PANEL
ALT: 23 U/L (ref 14–54)
ANION GAP: 11 (ref 5–15)
AST: 25 U/L (ref 15–41)
Albumin: 4.2 g/dL (ref 3.5–5.0)
Alkaline Phosphatase: 39 U/L (ref 38–126)
BUN: 16 mg/dL (ref 6–20)
CHLORIDE: 106 mmol/L (ref 101–111)
CO2: 21 mmol/L — AB (ref 22–32)
Calcium: 8.6 mg/dL — ABNORMAL LOW (ref 8.9–10.3)
Creatinine, Ser: 0.84 mg/dL (ref 0.44–1.00)
Glucose, Bld: 81 mg/dL (ref 65–99)
POTASSIUM: 3.6 mmol/L (ref 3.5–5.1)
SODIUM: 138 mmol/L (ref 135–145)
Total Bilirubin: 0.7 mg/dL (ref 0.3–1.2)
Total Protein: 7.5 g/dL (ref 6.5–8.1)

## 2017-04-30 LAB — ACETAMINOPHEN LEVEL

## 2017-04-30 LAB — ETHANOL

## 2017-04-30 LAB — SALICYLATE LEVEL

## 2017-04-30 MED ORDER — FAMOTIDINE 20 MG PO TABS
20.0000 mg | ORAL_TABLET | Freq: Every day | ORAL | Status: DC
  Administered 2017-05-01: 20 mg via ORAL
  Filled 2017-04-30: qty 1

## 2017-04-30 MED ORDER — LIDOCAINE-EPINEPHRINE (PF) 2 %-1:200000 IJ SOLN
20.0000 mL | Freq: Once | INTRAMUSCULAR | Status: DC
  Filled 2017-04-30: qty 20

## 2017-04-30 MED ORDER — DOXEPIN HCL 50 MG PO CAPS
150.0000 mg | ORAL_CAPSULE | Freq: Every day | ORAL | Status: DC
  Administered 2017-05-01: 150 mg via ORAL
  Filled 2017-04-30 (×2): qty 3

## 2017-04-30 MED ORDER — CLONAZEPAM 0.5 MG PO TABS
0.5000 mg | ORAL_TABLET | Freq: Every day | ORAL | Status: DC | PRN
  Filled 2017-04-30: qty 1

## 2017-04-30 MED ORDER — FLUTICASONE PROPIONATE 50 MCG/ACT NA SUSP: 1.0000 | Freq: Every day | NASAL | Status: DC

## 2017-04-30 MED ORDER — MONTELUKAST SODIUM 5 MG PO CHEW
5.0000 mg | CHEWABLE_TABLET | Freq: Every day | ORAL | Status: DC
  Administered 2017-05-01: 5 mg via ORAL
  Filled 2017-04-30 (×2): qty 1

## 2017-04-30 MED ORDER — BECLOMETHASONE DIPROPIONATE 80 MCG/ACT IN AERS: 2.0000 | INHALATION_SPRAY | Freq: Two times a day (BID) | RESPIRATORY_TRACT | Status: DC

## 2017-04-30 MED ORDER — ZIPRASIDONE HCL 20 MG PO CAPS
40.0000 mg | ORAL_CAPSULE | Freq: Every day | ORAL | Status: DC
  Administered 2017-05-01: 40 mg via ORAL
  Filled 2017-04-30 (×2): qty 2

## 2017-04-30 NOTE — ED Triage Notes (Addendum)
Pt states that she cut herself today. She denies wanting to kill herself, but states that she just wanted to hurt herself. She also reports that she feels like people on TV are talking about her and she also feels like everyone is looking at her. Denies AVH. Laceration noted to L forearm approx 4cm length. Not bleeding at this time.

## 2017-04-30 NOTE — ED Notes (Signed)
TTS in with pt. 

## 2017-04-30 NOTE — ED Provider Notes (Signed)
[facility]-EMERGENCY DEPT Provider Note   CSN: [id number] Arrival date & time: 04/30/17  1921     History   Chief Complaint Chief Complaint  Patient presents with  . Paranoid  . Self Harm    HPI Shelia Ramos is a 45 y.o. female. Patient presents for evaluation through triage comfortably but her family. She has a history of atypical depression with psychotic features. Meds include doxepin, and Geodon. She stopped taking her Geodon a few weeks ago. And he is on her doxepin. Husband states that he noticed her behavior changing a few days ago. He called and made her appointment. She goes to [facility]. There is a Publishing rights manager that prescribes her medications. She was started back on her Geodon yesterday. She was typically on 40 mg per day. They started her back on 20 per day for the last 2 days. She again has continued on her doxepin and his regular dose of 75 mg.  She was alone today. She states that she became upset. She felt like the television was talking to her. She has a left volar forearm 3 similar laceration self-inflicted at home. She is nonspecific about her intention. She denies that she wants to kill herself now.  HPI  Past Medical History:  Diagnosis Date  . Allergy    SEASONAL  . Anxiety   . Asthma    AS A CHILD  . Depression   . IBS (irritable bowel syndrome)   . Seasonal allergies     Patient Active Problem List   Diagnosis Date Noted  . Bunion of great toe of left foot 10/09/2016  . IBS (irritable bowel syndrome) 10/09/2016  . Bipolar disorder with psychotic features (HCC) 10/09/2016  . Perimenopause- on Xulane 10/09/2016  . Insomnia disorder 10/09/2016  . Moderate persistent asthma-  09/28/2016  . Allergic rhinoconjunctivitis 09/28/2016  . Dysphagia 09/28/2016  . Constipation 01/03/2015  . Internal bleeding hemorrhoids 01/03/2015  . Anal bleeding 01/03/2015    Past Surgical History:  Procedure Laterality Date  . BUNIONECTOMY  Right   . COLPOSCOPY      OB History    No data available       Home Medications    Prior to Admission medications   Medication Sig Start Date End Date Taking? Authorizing Provider  albuterol (PROAIR HFA) 108 (90 Base) MCG/ACT inhaler Inhale 2 puffs into the lungs every 4 (four) hours as needed for wheezing or shortness of breath. 05/01/16  Yes Roselyn Kara Mead, MD  clonazePAM (KLONOPIN) 0.5 MG tablet Take 0.5 mg by mouth daily as needed for anxiety.  02/05/17  Yes Historical Provider, MD  doxepin (SINEQUAN) 75 MG capsule Take 150 mg by mouth at bedtime. 02/05/17  Yes Historical Provider, MD  EPINEPHrine (EPIPEN 2-PAK) 0.3 mg/0.3 mL IJ SOAJ injection Inject 0.3 mLs (0.3 mg total) into the muscle once. 05/01/16  Yes Roselyn Kara Mead, MD  hyoscyamine (LEVSIN SL) 0.125 MG SL tablet PLACE 1 TABLET UNDER TONGUE EVERY 6 HOURS AS NEEDED 10/14/16  Yes Kavitha Nandigam V, MD  Inulin (FIBER CHOICE FRUITY BITES) 1.5 g CHEW Chew 2 Units by mouth daily.   Yes Historical Provider, MD  montelukast (SINGULAIR) 5 MG chewable tablet Take two tablets each evening. 11/17/16  Yes Jessica Priest, MD  Probiotic Product (PROBIOTIC DAILY PO) Take 1 tablet by mouth daily.   Yes Historical Provider, MD  Spacer/Aero-Holding Chambers (AEROCHAMBER PLUS FLO-VU LARGE) MISC Use with MDI as directed. 07/26/16  Yes Roselyn Kara Mead,  MD  ziprasidone (GEODON) 40 MG capsule Take 40 mg by mouth daily.    Yes Historical Provider, MD  beclomethasone (QVAR) 80 MCG/ACT inhaler Inhale 2 puffs into the lungs 2 (two) times daily. Patient not taking: Reported on 04/30/2017 09/28/16   Shaylar Larose HiresPatricia Padgett, MD  fluticasone [facility](FLONASE) 50 MCG/ACT nasal spray Use 1-2 sprays each nostril each morning. Patient not taking: Reported on 04/30/2017 05/01/16   Baxter Hireoselyn M Hicks, MD  NONFORMULARY OR COMPOUNDED ITEM [facility]: Antiinflammatory Cream - Diclofenac 3%, Baclofen 2%, Lidocaine 2%, apply 1-2 grams to affected area 3-4 times daily. Patient not  taking: Reported on 04/30/2017 10/06/16   Asencion Islamitorya Stover, DPM  ranitidine (ZANTAC) 150 MG tablet Take 1 tablet (150 mg total) by mouth at bedtime. Patient not taking: Reported on 04/30/2017 12/04/16   Napoleon FormKavitha Nandigam V, MD    Family History Family History  Problem Relation Age of Onset  . Anxiety disorder Mother   . Depression Mother   . Allergic rhinitis Father   . Allergic rhinitis Paternal Aunt   . Allergic rhinitis Paternal Grandmother   . Cancer Paternal Grandmother     stomach  . Heart attack Maternal Grandfather   . Angioedema Neg Hx   . Asthma Neg Hx   . Atopy Neg Hx   . Eczema Neg Hx   . Immunodeficiency Neg Hx   . Urticaria Neg Hx     Social History Social History  Substance Use Topics  . Smoking status: Never Smoker  . Smokeless tobacco: Never Used  . Alcohol use No     Allergies   Patient has no known allergies.   Review of Systems Review of Systems  Constitutional: Negative for appetite change, chills, diaphoresis, fatigue and fever.  HENT: Negative for mouth sores, sore throat and trouble swallowing.   Eyes: Negative for visual disturbance.  Respiratory: Negative for cough, chest tightness, shortness of breath and wheezing.   Cardiovascular: Negative for chest pain.  Gastrointestinal: Negative for abdominal distention, abdominal pain, diarrhea, nausea and vomiting.  Endocrine: Negative for polydipsia, polyphagia and polyuria.  Genitourinary: Negative for dysuria, frequency and hematuria.  Musculoskeletal: Negative for gait problem.  Skin: Negative for color change, pallor and rash.  Neurological: Negative for dizziness, syncope, light-headedness and headaches.  Hematological: Does not bruise/bleed easily.  Psychiatric/Behavioral: Positive for behavioral problems and self-injury. Negative for confusion. The patient is nervous/anxious.      Physical Exam Updated Vital Signs BP 130/87 (BP Location: Right Arm)   Pulse (!) 117   Temp 98.7 F (37.1 C)  (Oral)   Resp 18   LMP 04/02/2017   SpO2 99%   Physical Exam  Constitutional: She is oriented to person, place, and time. She appears well-developed and well-nourished. No distress.  HENT:  Head: Normocephalic.  Eyes: Conjunctivae are normal. Pupils are equal, round, and reactive to light. No scleral icterus.  Neck: Normal range of motion. Neck supple. No thyromegaly present.  Cardiovascular: Normal rate and regular rhythm.  Exam reveals no gallop and no friction rub.   No murmur heard. Pulmonary/Chest: Effort normal and breath sounds normal. No respiratory distress. She has no wheezes. She has no rales.  Abdominal: Soft. Bowel sounds are normal. She exhibits no distension. There is no tenderness. There is no rebound.  Musculoskeletal: Normal range of motion.  Neurological: She is alert and oriented to person, place, and time.  Skin: Skin is warm and dry. No rash noted.  3 similar left volar mid forearm laceration. Normal capillary  refill and pulses. Normal tendon function. Normal sensation distally.  Psychiatric: She has a normal mood and affect. Her behavior is normal.     ED Treatments / Results  Labs (all labs ordered are listed, but only abnormal results are displayed) Labs Reviewed  COMPREHENSIVE METABOLIC PANEL - Abnormal; Notable for the following:       Result Value   CO2 21 (*)    Calcium 8.6 (*)    All other components within normal limits  ACETAMINOPHEN LEVEL - Abnormal; Notable for the following:    Acetaminophen (Tylenol), Serum <10 (*)    All other components within normal limits  ETHANOL  SALICYLATE LEVEL  CBC  RAPID URINE DRUG SCREEN, HOSP PERFORMED    EKG  EKG Interpretation None       Radiology No results found.  Procedures Procedures (including critical care time)  Medications Ordered in ED Medications  lidocaine-EPINEPHrine (XYLOCAINE W/EPI) 2 %-1:200000 (PF) injection 20 mL (not administered)     Initial Impression / Assessment and  Plan / ED Course  I have reviewed the triage vital signs and the nursing notes.  Pertinent labs & imaging results that were available during my care of the patient were reviewed by me and considered in my medical decision making (see chart for details).    LACERATION REPAIR Performed by: Claudean Kinds Authorized by: Claudean Kinds Consent: Verbal consent obtained. Risks and benefits: risks, benefits and alternatives were discussed Consent given by: patient Patient identity confirmed: provided demographic data Prepped and Draped in normal sterile fashion Wound explored  Laceration Location: Left forearm, volar  Laceration Length: 2cm  No Foreign Bodies seen or palpated  Anesthesia: local infiltration  Local anesthetic: lidocaine 1% c epinephrine  Anesthetic total: 3 ml  Irrigation method: syringe Amount of cleaning: standard  Skin closure: 4-0 Nylon   Number of sutures: 6  Technique: running, locking  Patient tolerance: Patient tolerated the procedure well with no immediate complications.  Final Clinical Impressions(s) / ED Diagnoses   Final diagnoses:  Auditory hallucinations  Laceration of left wrist, initial encounter    New Prescriptions New Prescriptions   No medications on file     Rolland Porter, MD 04/30/17 2334

## 2017-04-30 NOTE — ED Notes (Signed)
Bed: [facility] Expected date:  Expected time:  Means of arrival:  Comments: 

## 2017-05-01 ENCOUNTER — Encounter (HOSPITAL_COMMUNITY): Payer: Self-pay | Admitting: *Deleted

## 2017-05-01 ENCOUNTER — Inpatient Hospital Stay (HOSPITAL_COMMUNITY)
Admission: AD | Admit: 2017-05-01 | Discharge: 2017-05-10 | DRG: 885 | Disposition: A | Discharge status: Home / Self Care | Payer: 59 | Source: Intra-hospital | Attending: Psychiatry | Admitting: Psychiatry

## 2017-05-01 DIAGNOSIS — Z9119 Patient's noncompliance with other medical treatment and regimen: Secondary | ICD-10-CM

## 2017-05-01 DIAGNOSIS — F315 Bipolar disorder, current episode depressed, severe, with psychotic features: Secondary | ICD-10-CM | POA: Diagnosis not present

## 2017-05-01 DIAGNOSIS — Z818 Family history of other mental and behavioral disorders: Secondary | ICD-10-CM | POA: Diagnosis not present

## 2017-05-01 DIAGNOSIS — X781XXA Intentional self-harm by knife, initial encounter: Secondary | ICD-10-CM | POA: Diagnosis not present

## 2017-05-01 DIAGNOSIS — G4700 Insomnia, unspecified: Secondary | ICD-10-CM | POA: Diagnosis present

## 2017-05-01 DIAGNOSIS — R45851 Suicidal ideations: Secondary | ICD-10-CM | POA: Diagnosis present

## 2017-05-01 DIAGNOSIS — S41119A Laceration without foreign body of unspecified upper arm, initial encounter: Secondary | ICD-10-CM | POA: Diagnosis not present

## 2017-05-01 DIAGNOSIS — Z79899 Other long term (current) drug therapy: Secondary | ICD-10-CM | POA: Diagnosis not present

## 2017-05-01 DIAGNOSIS — R000 Tachycardia, unspecified: Secondary | ICD-10-CM | POA: Diagnosis present

## 2017-05-01 DIAGNOSIS — F319 Bipolar disorder, unspecified: Secondary | ICD-10-CM | POA: Diagnosis present

## 2017-05-01 DIAGNOSIS — S61512A Laceration without foreign body of left wrist, initial encounter: Secondary | ICD-10-CM | POA: Diagnosis not present

## 2017-05-01 DIAGNOSIS — T1491XA Suicide attempt, initial encounter: Secondary | ICD-10-CM | POA: Diagnosis not present

## 2017-05-01 DIAGNOSIS — F29 Unspecified psychosis not due to a substance or known physiological condition: Secondary | ICD-10-CM | POA: Diagnosis not present

## 2017-05-01 DIAGNOSIS — J45909 Unspecified asthma, uncomplicated: Secondary | ICD-10-CM | POA: Diagnosis not present

## 2017-05-01 DIAGNOSIS — F332 Major depressive disorder, recurrent severe without psychotic features: Secondary | ICD-10-CM | POA: Diagnosis present

## 2017-05-01 LAB — RAPID URINE DRUG SCREEN, HOSP PERFORMED
Amphetamines: NOT DETECTED
BARBITURATES: NOT DETECTED
Benzodiazepines: NOT DETECTED
Cocaine: NOT DETECTED
OPIATES: NOT DETECTED
TETRAHYDROCANNABINOL: NOT DETECTED

## 2017-05-01 IMAGING — DX DG ANKLE COMPLETE 3+V*R*
3 series · 3 of 3 positions shown · non-contrast
Comparison: None.

CLINICAL DATA: Fell down stairs today, pain and swelling laterally

EXAM:
RIGHT ANKLE - COMPLETE 3+ VIEW

[ankle ap]
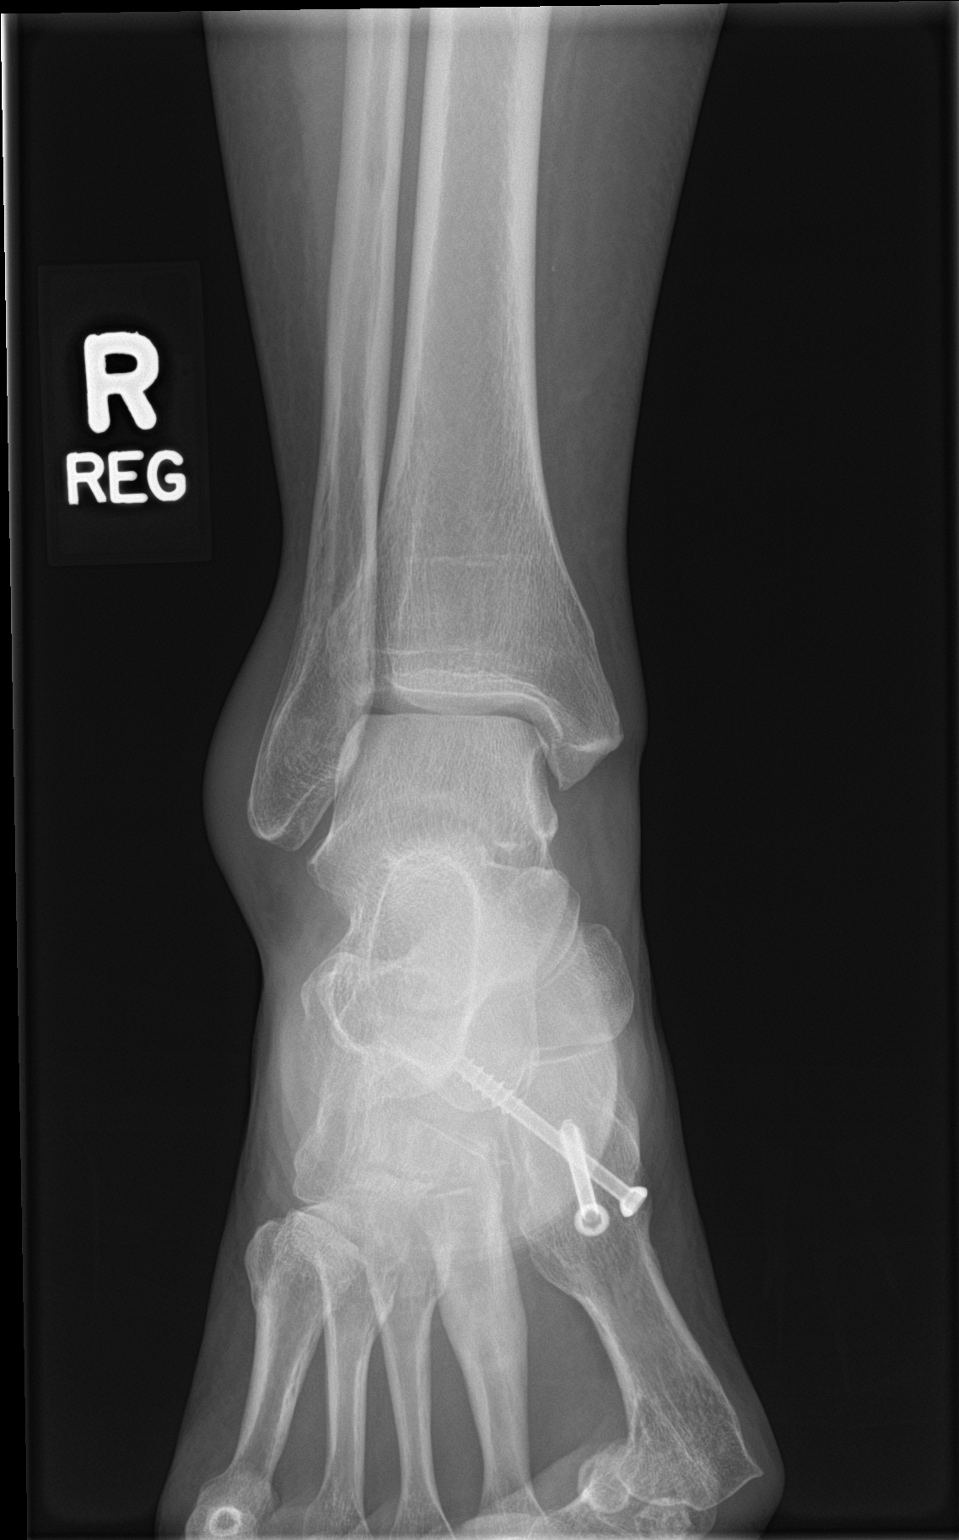

[ankle obl]
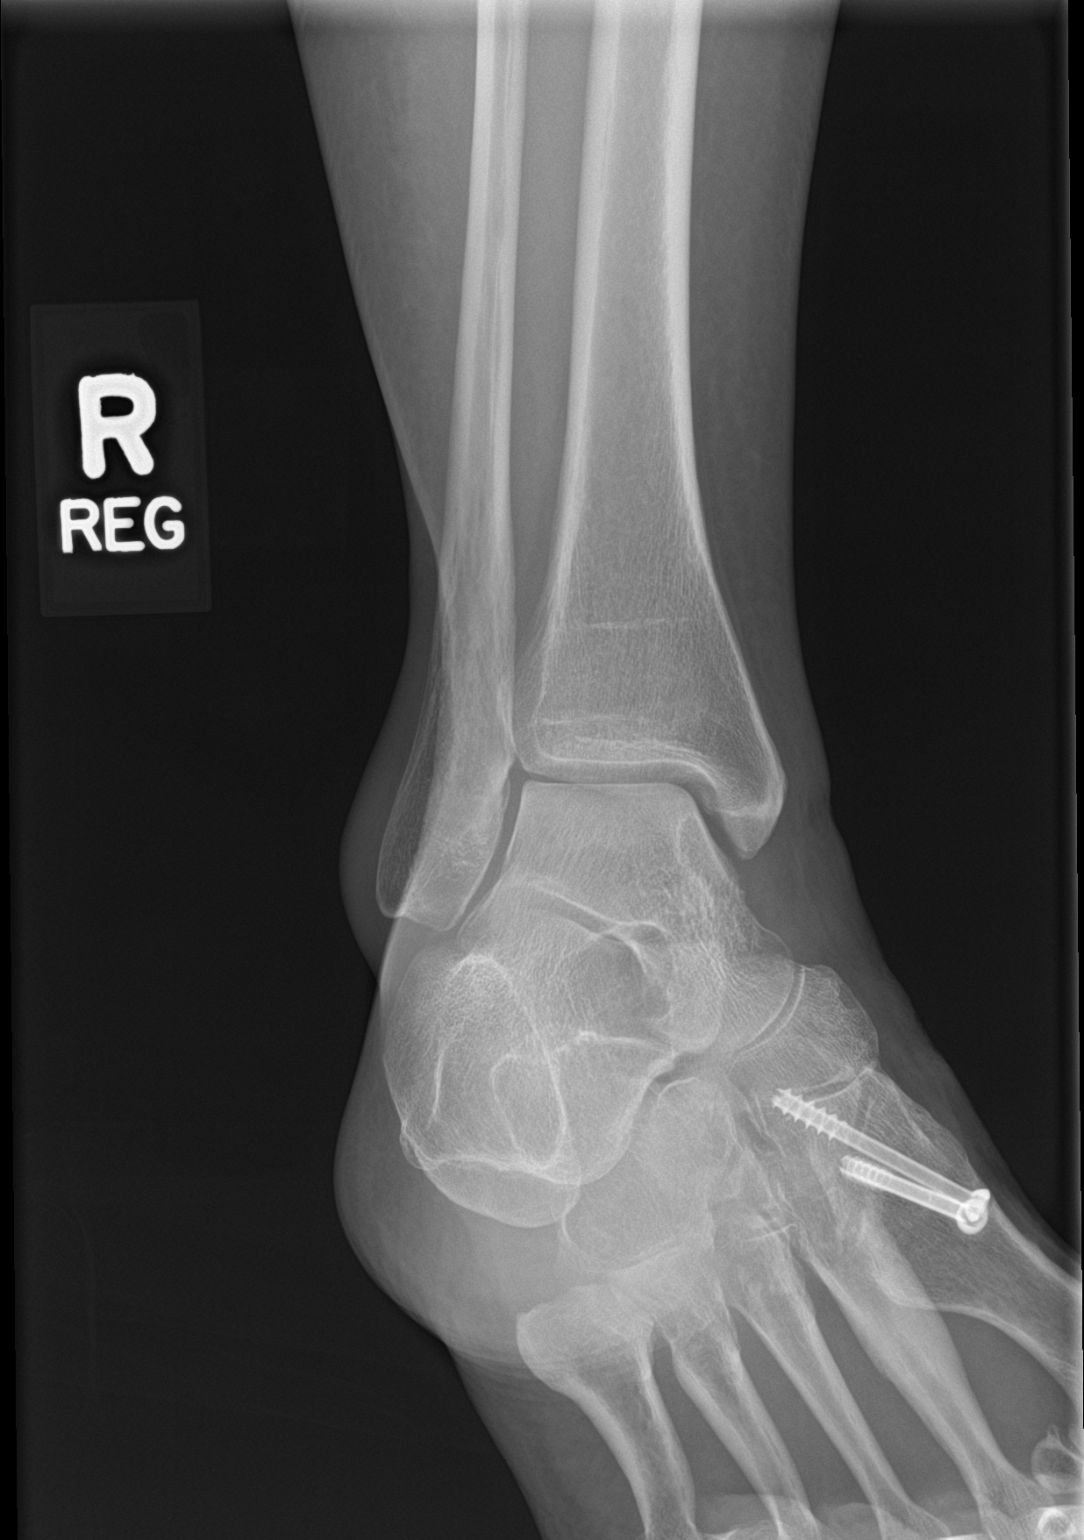

[ankle lat]
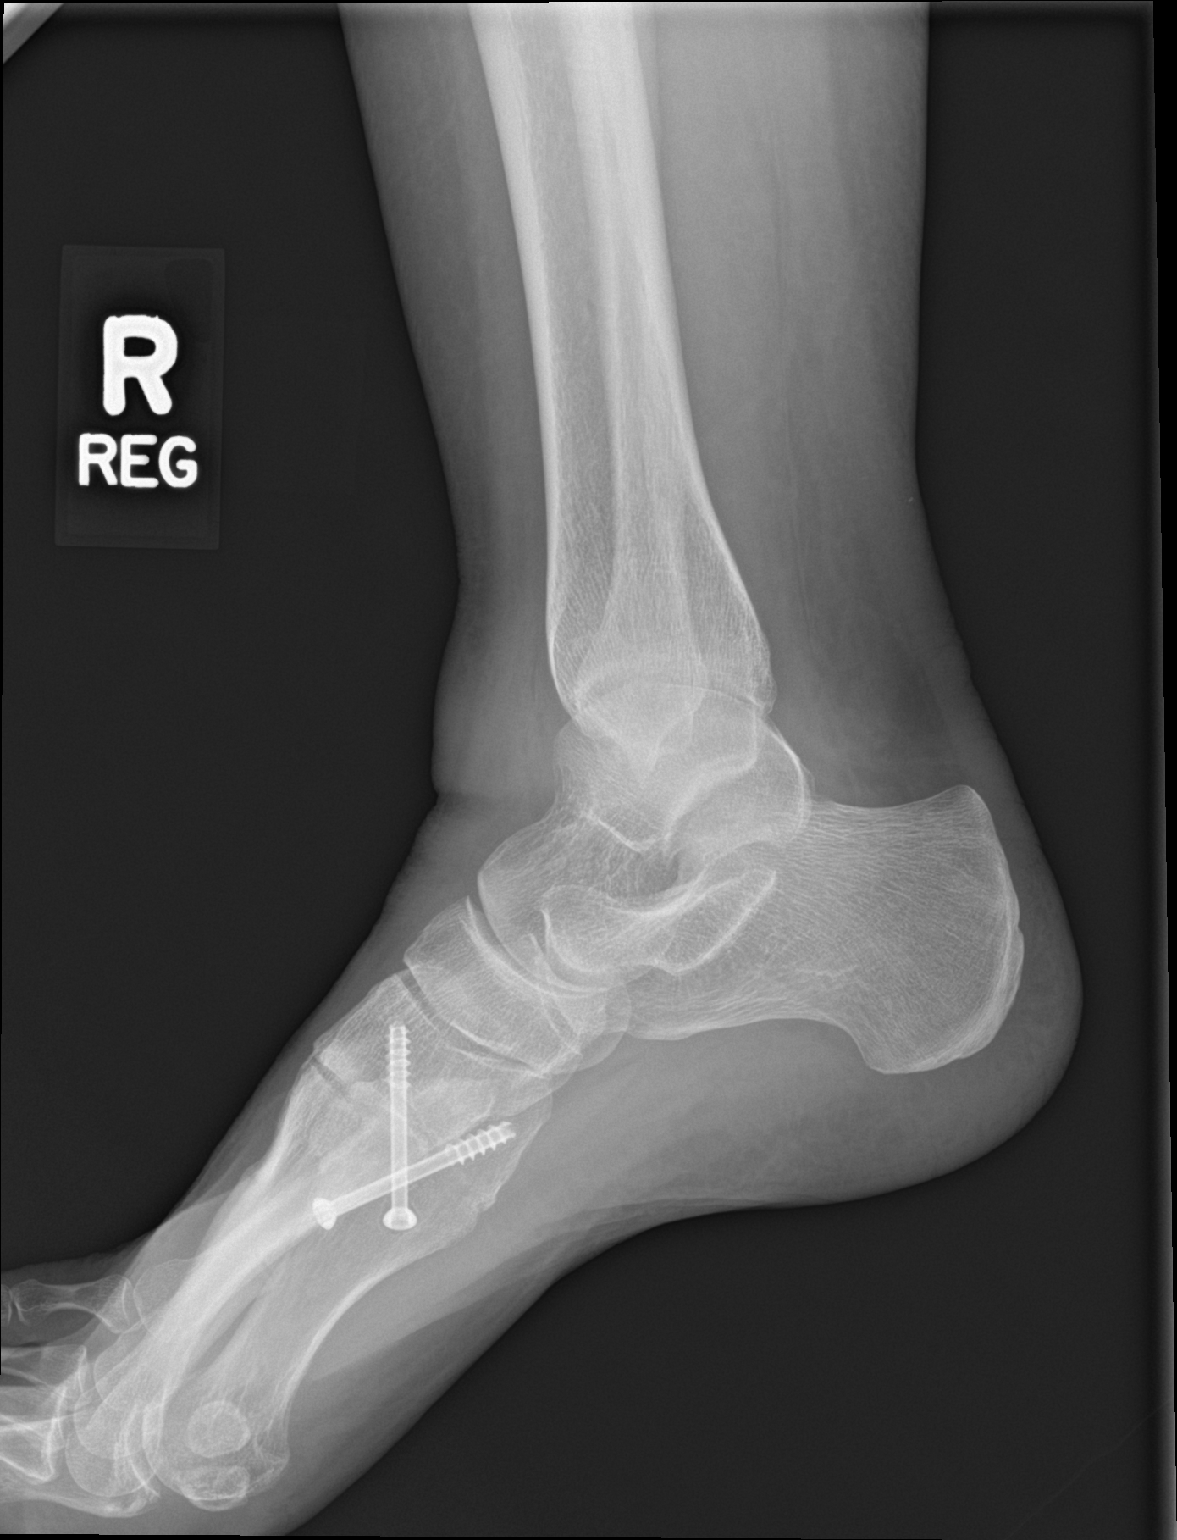

[3 of 3 positions shown; findings below may reference images not displayed]

FINDINGS: The ankle joint appears normal. Alignment is normal. There is soft
tissue swelling over the lateral malleolus. No fracture is noted.
IMPRESSION: No acute abnormality.

## 2017-05-01 MED ORDER — ACETAMINOPHEN 500 MG PO TABS
1000.0000 mg | ORAL_TABLET | Freq: Four times a day (QID) | ORAL | Status: DC | PRN
  Administered 2017-05-01: 1000 mg via ORAL
  Filled 2017-05-01: qty 2

## 2017-05-01 MED ORDER — DOXEPIN HCL 25 MG PO CAPS
ORAL_CAPSULE | ORAL | Status: AC
  Filled 2017-05-01: qty 6

## 2017-05-01 MED ORDER — DOXEPIN HCL 25 MG PO CAPS
150.0000 mg | ORAL_CAPSULE | Freq: Every day | ORAL | Status: DC
  Administered 2017-05-01 – 2017-05-02 (×2): 150 mg via ORAL
  Filled 2017-05-01 (×2): qty 3
  Filled 2017-05-01: qty 6
  Filled 2017-05-01: qty 3

## 2017-05-01 MED ORDER — HYDROXYZINE HCL 25 MG PO TABS
50.0000 mg | ORAL_TABLET | Freq: Once | ORAL | Status: AC
  Administered 2017-05-01: 50 mg via ORAL
  Filled 2017-05-01: qty 2

## 2017-05-01 NOTE — [facility] Counselor (Signed)
Clinician referred the pt to the following inpatient treatment facilities:    [facility]  TTS will continue to follow up with collaterals and seek placement.    Gwinda Passereylese D Bennett, MS, [facility], [facility] Triage Specialist [phone]

## 2017-05-01 NOTE — ED Notes (Signed)
[facility]

## 2017-05-01 NOTE — ED Notes (Signed)
Pt stated "I have to take my pills with food."  Pt provided applesauce.  Pt opens each capsule & pours a small amount on spoon with applesauce.  Pt stated "I don't eat meat.  I eat vegetables and dairy."

## 2017-05-01 NOTE — Tx Team (Signed)
Initial Treatment Plan 05/01/2017 11:57 PM Shelia HooverKimberly D Ramos ZOX:[id number]    PATIENT STRESSORS: Medication change or noncompliance Traumatic event   PATIENT STRENGTHS: Average or above average intelligence Supportive family/friends   PATIENT IDENTIFIED PROBLEMS: Suicidal Ideation  Auditory hallucinations  "feel better"                 DISCHARGE CRITERIA:  Improved stabilization in mood, thinking, and/or behavior Motivation to continue treatment in a less acute level of care Need for constant or close observation no longer present Verbal commitment to aftercare and medication compliance  PRELIMINARY DISCHARGE PLAN: Attend aftercare/continuing care group Outpatient therapy Return to previous living arrangement  PATIENT/FAMILY INVOLVEMENT: This treatment plan has been presented to and reviewed with the patient, Shelia HooverKimberly D Ramos.  The patient and family have been given the opportunity to ask questions and make suggestions.  Juliann ParesBowman, Evaleen Sant Elizabeth, RN 05/01/2017, 11:57 PM

## 2017-05-01 NOTE — ED Notes (Signed)
Nurse called to patient's room by sitter.  Patient requesting to leave the hospital and go home with her daughter and husband.  Nurse explained that patient is here voluntary, but because she cut her arm requiring sutures, the MD would IVC her if necessary because she is a danger to herself.  Patient accepted this but did say she felt very anxious.  Patient not eating.  Gave patient apple juice.  Patient has difficulty swallowing food and her pills have to be crushed and placed in applesauce.

## 2017-05-01 NOTE — ED Notes (Signed)
Daughter at bedside visiting with mother.

## 2017-05-01 NOTE — ED Notes (Signed)
Pt stating "I'm ready to go home."  Pt informed will be assessed this a.m.  Pt verbalized understanding.

## 2017-05-01 NOTE — BH Assessment (Addendum)
Tele Assessment Note   Shelia Ramos is an 46 y.o. female, who presented voluntary and unaccompanied to  [facility]. Pt reported, "I was watching TV, I felt people were talking about me and talking to me, negatively." Pt reported, she then cut her arm with a knife which required stiches. Pt reported, cutting her arm was a suicide attempt. Pt reported, she is passive and actively suicidal. Pt reported, "people are starting at me and out to get me." Pt reported, feeling "a little paranoid." Pt reported, access to knives. Pt denied, HI.   Pt denies abuse. Pt's UDS is pending. Pt denied substance use. Pt reported, being linked to counseling at [facility] with [place]. Pt reported, seeing Zella Ball, NP for medication management. Pt reported, not taking her medications as prescribed. Pt reported, previous inpatient admission at [facility].   Pt presented alert with logical/coherent speech. Pt's eye contact was good. Pt's mood was depressed. Pt's affect was appropriate to circumstance. Pt's thought process was relevant/coherent. Pt's judgement was unimpaired. Pt's concentration was normal. Pt's insight was fair. Pt's impulse control was poor. Pt reported, if discharged from [facility] she could not contract for safety. Pt reported, if inpatient treatment was recommended she would sign-in voluntarily.   Diagnosis: Major Depressive Disorder, Recurrent, Severe with Psychotic Features.   Past Medical History:  Past Medical History:  Diagnosis Date  . Allergy    SEASONAL  . Anxiety   . Asthma    AS A CHILD  . Depression   . IBS (irritable bowel syndrome)   . Seasonal allergies     Past Surgical History:  Procedure Laterality Date  . BUNIONECTOMY Right   . COLPOSCOPY      Family History:  Family History  Problem Relation Age of Onset  . Anxiety disorder Mother   . Depression Mother   . Allergic rhinitis Father   . Allergic rhinitis Paternal Aunt   . Allergic rhinitis Paternal Grandmother   .  Cancer Paternal Grandmother     stomach  . Heart attack Maternal Grandfather   . Angioedema Neg Hx   . Asthma Neg Hx   . Atopy Neg Hx   . Eczema Neg Hx   . Immunodeficiency Neg Hx   . Urticaria Neg Hx     Social History:  reports that she has never smoked. She has never used smokeless tobacco. She reports that she does not drink alcohol or use drugs.  Additional Social History:  Alcohol / Drug Use Pain Medications: See MAR Prescriptions: See MAR Over the Counter: See MAR History of alcohol / drug use?: No history of alcohol / drug abuse (Pt denies. )  CIWA: CIWA-Ar BP: 126/73 Pulse Rate: 95 COWS:    PATIENT STRENGTHS: (choose at least two) Average or above average intelligence Supportive family/friends  Allergies: No Known Allergies  Home Medications:  (Not in a hospital admission)  OB/GYN Status:  Patient's last menstrual period was 04/02/2017.  General Assessment Data Location of Assessment: [facility] TTS Assessment: In system Is this a Tele or Face-to-Face Assessment?: Face-to-Face Is this an Initial Assessment or a Re-assessment for this encounter?: Initial Assessment Marital status: Married Is patient pregnant?: No Pregnancy Status: No Living Arrangements: Children, Spouse/significant other Can pt return to current living arrangement?: Yes Admission Status: Voluntary Is patient capable of signing voluntary admission?: Yes Referral Source: Self/Family/Friend Insurance type: Self-pay     Crisis Care Plan Living Arrangements: Children, Spouse/significant other Legal Guardian: Other: (Self) Name of Psychiatrist: Zella Ball, NP Name  of Therapist: Debarah Crape, counselor.  Education Status Is patient currently in school?: No Current Grade: NA Highest grade of school patient has completed: 12th grade Name of school: NA Contact person: NA  Risk to self with the past 6 months Suicidal Ideation: Yes-Currently Present Has patient been a risk to self within the past 6  months prior to admission? : Yes Suicidal Intent: Yes-Currently Present Has patient had any suicidal intent within the past 6 months prior to admission? : Yes Is patient at risk for suicide?: Yes Suicidal Plan?: Yes-Currently Present Has patient had any suicidal plan within the past 6 months prior to admission? : Yes Specify Current Suicidal Plan: Pt cut arm with knive needed stitches, pt reported, it was a suicide attempt.  Access to Means: Yes Specify Access to Suicidal Means: Pt has access to knives.  What has been your use of drugs/alcohol within the last 12 months?: Pt denies.  Previous Attempts/Gestures: No How many times?: 0 Other Self Harm Risks: Pt reported, cutting her arm.  Triggers for Past Attempts: None known Intentional Self Injurious Behavior: Cutting Comment - Self Injurious Behavior: Pt cut arm with knive needed stitches, pt reported, it was a suicide attempt.  Family Suicide History: No Recent stressful life event(s): Other (Comment) Persecutory voices/beliefs?: No Depression: Yes Depression Symptoms: Tearfulness, Feeling worthless/self pity, Feeling angry/irritable, Guilt Substance abuse history and/or treatment for substance abuse?: No Suicide prevention information given to non-admitted patients: Not applicable  Risk to Others within the past 6 months Homicidal Ideation: No (Pt denies. ) Does patient have any lifetime risk of violence toward others beyond the six months prior to admission? : No Thoughts of Harm to Others: No Current Homicidal Intent: No Current Homicidal Plan: No Access to Homicidal Means: No Identified Victim: Pt denies.  History of harm to others?: No Assessment of Violence: None Noted Violent Behavior Description: NA Does patient have access to weapons?: Yes (Comment) (knives) Criminal Charges Pending?: No Does patient have a court date: No Is patient on probation?: No  Psychosis Hallucinations: Auditory, Visual Delusions:  Unspecified  Mental Status Report Appearance/Hygiene: Unremarkable Eye Contact: Good Motor Activity: Freedom of movement Speech: Logical/coherent Level of Consciousness: Alert Mood: Depressed Affect: Appropriate to circumstance Anxiety Level: None Thought Processes: Relevant, Coherent Judgement: Unimpaired Orientation: Other (Comment) (day, year, city and state.) Obsessive Compulsive Thoughts/Behaviors: None  Cognitive Functioning Concentration: Normal Memory: Recent Intact IQ: Average Insight: Fair Impulse Control: Poor Appetite: Poor Weight Loss: 0 Weight Gain: 0 Sleep: Decreased Total Hours of Sleep:  (Pt reported, "it varies." ) Vegetative Symptoms: None  ADLScreening [facility] Assessment Services) Patient's cognitive ability adequate to safely complete daily activities?: Yes Patient able to express need for assistance with ADLs?: Yes Independently performs ADLs?: Yes (appropriate for developmental age)  Prior Inpatient Therapy Prior Inpatient Therapy: Yes Prior Therapy Dates: UTA Prior Therapy Facilty/Provider(s): [facility] Reason for Treatment: UTA  Prior Outpatient Therapy Prior Outpatient Therapy: Yes Prior Therapy Dates: Current Prior Therapy Facilty/Provider(s): Zella Ball, NP and Debarah Crape at [facility] Reason for Treatment: medication management and counseling.  Does patient have an ACCT team?: No Does patient have Intensive In-House Services?  : No Does patient have [facility] services? : No Does patient have P4CC services?: No  ADL Screening (condition at time of admission) Patient's cognitive ability adequate to safely complete daily activities?: Yes Is the patient deaf or have difficulty hearing?: No Does the patient have difficulty seeing, even when wearing glasses/contacts?: Yes Does the patient have difficulty concentrating, remembering, or making  decisions?: Yes Patient able to express need for assistance with ADLs?: Yes Does the patient have  difficulty dressing or bathing?: No Independently performs ADLs?: Yes (appropriate for developmental age) Does the patient have difficulty walking or climbing stairs?: No Weakness of Legs: None Weakness of Arms/Hands: None       Abuse/Neglect Assessment (Assessment to be complete while patient is alone) Physical Abuse: Denies (Pt denies.) Verbal Abuse: Denies (Pt denies.) Sexual Abuse: Denies (Pt denies. ) Exploitation of patient/patient's resources: Denies (Pt denies.) Self-Neglect: Denies (Pt denies.)     Merchant navy officerAdvance Directives (For Healthcare) Does Patient Have a Medical Advance Directive?: No Would patient like information on creating a medical advance directive?: No - Patient declined    Additional Information 1:1 In Past 12 Months?: No CIRT Risk: No Elopement Risk: No Does patient have medical clearance?: Yes     Disposition: Nira ConnJason Berry, NP recommends inpatient treatment, Dr. Fayrene FearingJames is in agreement. Disposition was discussed with Consuella LoseElaine, RN. Per Chyrl CivatteJoAnn, [facility].   Disposition Initial Assessment Completed for this Encounter: Yes Disposition of Patient: Inpatient treatment program Type of inpatient treatment program: Adult  Gwinda Passereylese D Bennett 05/01/2017 12:34 AM   Gwinda Passereylese D Bennett, MS, [facility], [facility] Triage Specialist [phone]\

## 2017-05-01 NOTE — BH Assessment (Signed)
BHH Assessment Progress Note   Clinician discussed voluntary admission paperwork with patient and obtained signature.  Nurse Reita ClicheBobby informed that patient would be going to [facility] and that patient would be followed by Dr. Elna BreslowEappen.  [facility]. Fayrene [facility]earingJames [facility].

## 2017-05-02 DIAGNOSIS — F332 Major depressive disorder, recurrent severe without psychotic features: Secondary | ICD-10-CM | POA: Diagnosis present

## 2017-05-02 DIAGNOSIS — F319 Bipolar disorder, unspecified: Secondary | ICD-10-CM

## 2017-05-02 LAB — LIPID PANEL
CHOLESTEROL: 244 mg/dL — AB (ref 0–200)
HDL: 61 mg/dL (ref >40)
LDL Cholesterol: 161 mg/dL — ABNORMAL HIGH (ref 0–99)
TRIGLYCERIDES: 109 mg/dL (ref <150)
Total CHOL/HDL Ratio: 4 RATIO
VLDL: 22 mg/dL (ref 0–40)

## 2017-05-02 LAB — TSH: TSH: 1.246 u[IU]/mL (ref 0.350–4.500)

## 2017-05-02 MED ORDER — ENSURE ENLIVE PO LIQD
237.0000 mL | Freq: Two times a day (BID) | ORAL | Status: DC
  Administered 2017-05-03 (×2): 237 mL via ORAL

## 2017-05-02 MED ORDER — MONTELUKAST SODIUM 5 MG PO CHEW
10.0000 mg | CHEWABLE_TABLET | Freq: Every day | ORAL | Status: DC
  Administered 2017-05-02 – 2017-05-09 (×8): 10 mg via ORAL
  Filled 2017-05-02 (×12): qty 2

## 2017-05-02 MED ORDER — ALBUTEROL SULFATE HFA 108 (90 BASE) MCG/ACT IN AERS: 2.0000 | INHALATION_SPRAY | RESPIRATORY_TRACT | Status: DC | PRN

## 2017-05-02 MED ORDER — RISPERIDONE 0.5 MG PO TABS
0.5000 mg | ORAL_TABLET | Freq: Two times a day (BID) | ORAL | Status: DC
  Administered 2017-05-02 – 2017-05-07 (×10): 0.5 mg via ORAL
  Filled 2017-05-02 (×12): qty 1

## 2017-05-02 MED ORDER — MAGNESIUM HYDROXIDE 400 MG/5ML PO SUSP: 30.0000 mL | Freq: Every day | ORAL | Status: DC | PRN

## 2017-05-02 MED ORDER — ALUM & MAG HYDROXIDE-SIMETH 200-200-20 MG/5ML PO SUSP: 30.0000 mL | ORAL | Status: DC | PRN

## 2017-05-02 MED ORDER — OLANZAPINE 5 MG PO TBDP
5.0000 mg | ORAL_TABLET | Freq: Once | ORAL | Status: AC
  Administered 2017-05-02: 5 mg via ORAL
  Filled 2017-05-02 (×2): qty 1

## 2017-05-02 MED ORDER — ACETAMINOPHEN 325 MG PO TABS: 650.0000 mg | ORAL_TABLET | Freq: Four times a day (QID) | ORAL | Status: DC | PRN

## 2017-05-02 NOTE — BHH Counselor (Signed)
Adult Comprehensive Assessment  Patient ID: Shelia Ramos, female   DOB: 10-24-1971, 46 y.o.   MRN: [id number]  Information Source: Information source: Patient  Current Stressors:  Educational / Learning stressors: NA Employment / Job issues: NA Family Relationships: Some strain in Pensions consultant / Lack of resources (include bankruptcy): Some strain as per pt report Housing / Lack of housing: NA Physical health (include injuries & life threatening diseases): Noncompliance with medications; insomnia Social relationships: Patient mainly socializes with family Substance abuse: Denies Bereavement / Loss: NA  Living/Environment/Situation:  Living Arrangements: Spouse/significant other, Children Living conditions (as described by patient or guardian): Single family home How long has patient lived in current situation?: "awhile" What is atmosphere in current home: Comfortable, Supportive  Family History:  Marital status: Married Number of Years Married: 25 What types of issues is patient dealing with in the relationship?: "Ups and downs" Additional relationship information: CSW asked if patient felt safe in her home and she said she did Does patient have children?: Yes How many children?: 1 How is patient's relationship with their children?: Good w 25 YO daughter  Childhood History:  By whom was/is the patient raised?: Both parents Additional childhood history information: Parents separated when pt was 2 YO Description of patient's relationship with caregiver when they were a child: Good with both Patient's description of current relationship with people who raised him/her: Good w mother; father deceased How were you disciplined when you got in trouble as a child/adolescent?: Loss of privileges Does patient have siblings?: No Did patient suffer any verbal/emotional/physical/sexual abuse as a child?: No Did patient suffer from severe childhood neglect?: No Has patient ever  been sexually abused/assaulted/raped as an adolescent or adult?: No Was the patient ever a victim of a crime or a disaster?: No Witnessed domestic violence?: No Has patient been effected by domestic violence as an adult?: No  Education:  Highest grade of school patient has completed: 12th grade Currently a student?: No  Employment/Work Situation:   Employment situation: Employed Where is patient currently employed?: [employer] How long has patient been employed?: 9 or 10 years Patient's job has been impacted by current illness: No What is the longest time patient has a held a job?: current job Where was the patient employed at that time?: "different places" Has patient ever been in the [employer]?: No  Financial Resources:   Financial resources: Income from employment, Income from spouse Does patient have a Lawyer or guardian?: No  Alcohol/Substance Abuse:   What has been your use of drugs/alcohol within the last 12 months?: Pt reports no use of substances If attempted suicide, did drugs/alcohol play a role in this?: No  Social Support System:   Forensic psychologist System: Poor Describe Community Support System: Pt depends on family, especially her daughter, for support Type of faith/religion: Ephriam Knuckles How does patient's faith help to cope with current illness?: Faith  Leisure/Recreation:   Leisure and Hobbies: Shopping and going out to eat  Strengths/Needs:   What things does the patient do well?: Primary school teacher, good homemaker In what areas does patient struggle / problems for patient: Paranoia and anxiety  Discharge Plan:   Does patient have access to transportation?: Yes Will patient be returning to same living situation after discharge?: Yes Currently receiving community mental health services: Yes (From Whom) [facility]) Does patient have financial barriers related to discharge medications?: No  Summary/Recommendations:   Summary  and Recommendations (to be completed by the evaluator): Pt is 46  YO married employed female admitted after suicide attempt. Patient's stressors include marriage, paranoia, finances and noncompliance with medication. Patient will benefit from crisis stabilization, medication evaluation, group therapy and psycho education, in addition to case management for discharge planning. At discharge it is recommended that patient adhere to the established discharge plan and continue in treatment  Shelia Ramos Shelia Ramos. 05/02/2017

## 2017-05-02 NOTE — [facility] Suicide Risk Assessment (Signed)
[facility]   Nursing information obtained from:  Patient Demographic factors:  Caucasian Current Mental Status:  Suicidal ideation indicated by patient, Suicidal ideation indicated by others, Self-harm behaviors, Self-harm thoughts Loss Factors:  NA Historical Factors:  Impulsivity, Family history of mental illness or substance abuse Risk Reduction Factors:  Sense of responsibility to family, Living with another person, especially a relative  Total Time spent with patient: 45 minutes Principal Problem: Bipolar disorder with psychotic features (HCC) Diagnosis:   Patient Active Problem List   Diagnosis Date Noted  . Major depressive disorder, recurrent episode, severe (HCC) [F33.2] 05/02/2017  . Bunion of great toe of left foot [M21.612] 10/09/2016  . IBS (irritable bowel syndrome) [K58.9] 10/09/2016  . Bipolar disorder with psychotic features (HCC) [F31.9] 10/09/2016  . Perimenopause- on Xulane [N95.1] 10/09/2016  . Insomnia disorder [G47.00] 10/09/2016  . Moderate persistent asthma-  [J45.40] 09/28/2016  . Allergic rhinoconjunctivitis [J30.9, H10.10] 09/28/2016  . Dysphagia [R13.10] 09/28/2016  . Constipation [K59.00] 01/03/2015  . Internal bleeding hemorrhoids [K64.8] 01/03/2015  . Anal bleeding [K62.5] 01/03/2015   Subjective Data: Patient is 46 year old Caucasian female who was admitted because of extreme paranoia, having delusion and hallucination.  She felt the TV watching her.  She cut her wrist with a knife which required stitches.  Patient require inpatient treatment and stabilization.  Please see history and physical for more detailed information.  Continued Clinical Symptoms:  Alcohol Use Disorder Identification Test Final Score (AUDIT): 0 The "Alcohol Use Disorders Identification Test", Guidelines for Use in Primary Care, Second Edition.  World Science writerHealth Organization [facility](WHO). Score between 0-7:  no or low risk or alcohol related problems. Score  between 8-15:  moderate risk of alcohol related problems. Score between 16-19:  high risk of alcohol related problems. Score 20 or above:  warrants further diagnostic evaluation for alcohol dependence and treatment.   CLINICAL FACTORS:   Depression:   Aggression Delusional Hopelessness Impulsivity Insomnia Severe Schizophrenia:   Command hallucinatons Less than 48 years old More than one psychiatric diagnosis Currently Psychotic Previous Psychiatric Diagnoses and Treatments   Musculoskeletal: Strength & Muscle Tone: within normal limits Gait & Station: normal Patient leans: N/A  Psychiatric Specialty Exam: Please see history and physical for complete mental status examination. Physical Exam  ROS  Blood pressure 105/67, pulse (!) 129, temperature 98.5 F (36.9 C), resp. rate 18, height 5\' 2"  (1.575 m), weight 48.5 kg (107 lb), last menstrual period 04/02/2017.Body mass index is 19.57 kg/m.     COGNITIVE FEATURES THAT CONTRIBUTE TO RISK:  Closed-mindedness, Loss of executive function, Polarized thinking and Thought constriction (tunnel vision)    SUICIDE RISK:   Severe:  Frequent, intense, and enduring suicidal ideation, specific plan, no subjective intent, but some objective markers of intent (i.e., choice of lethal method), the method is accessible, some limited preparatory behavior, evidence of impaired self-control, severe dysphoria/symptomatology, multiple risk factors present, and few if any protective factors, particularly a lack of social support.  PLAN OF CARE: Patient is [age] year old who was admitted due to severe depression, paranoia, having hallucination and suicidal attempt.  She cut her wrist with a knife that requires stitches.  Patient require treatment.  Please see history and physical for detailed plan of care.  I certify that inpatient services furnished can reasonably be expected to improve the patient's condition.   Odies Desa T., MD 05/02/2017, 12:16 PM

## 2017-05-02 NOTE — Progress Notes (Signed)
Admit note:  Pt is 46 year old Caucasian female admitted to the services of Dr. Elna BreslowEappen for suicidal ideation related to auditory hallucinations from her TV.  Pt stated that the people on TV were talking about her and to her negatively.  Pt denies drug or alcohol abuse and is a non smoker.  Pt has been non-compliant with prescription medication.  Pt is cooperative with the admission process.

## 2017-05-02 NOTE — Progress Notes (Signed)
D [name] has spent much of her day alone..in her room in her bed. A She completed her daily assessment and on it she wrote  She deneid SI today and she rated her depression, hopelessness and anxeity 5/10/6", respectively. She remains disorganized and easily startled and  prob thought blocking. R Safety in place.

## 2017-05-02 NOTE — BHH Group Notes (Signed)
[facility] Group Notes:  (Clinical Social Work)  05/02/2017  11:00AM-12:00PM  Summary of Progress/Problems:  The main focus of today's process group was to listen to a variety of genres of music and to identify that different types of music provoke different responses.  The patient then was able to identify personally what was soothing for them, as well as energizing, as well as how patient can personally use this knowledge in sleep habits, with depression, and with other symptoms.  The patient expressed at the beginning of group the overall feeling of "good" and sat with an unchanging smile on her face throughout group, listening but not appearing engaged.  She did not volunteer any comments throughout group when the group members in general were asked how various songs made them feel.  Type of Therapy:  Music Therapy   Participation Level:  Active  Participation Quality:  Attentive   Affect:  Not congruent  Cognitive:  Unable to assess  Insight:  Unable to assess  Engagement in Therapy:  Limited  Modes of Intervention:   Activity, Exploration  Ambrose MantleMareida Grossman-Orr, LCSW 05/02/2017

## 2017-05-02 NOTE — Progress Notes (Signed)
Psychoeducational Group Note  Date:  05/02/2017 Time:  2030  Group Topic/Focus:  wrap up group  Participation Level: Did Not Attend  Participation Quality:  Not Applicable  Affect:  Not Applicable  Cognitive:  Not Applicable  Insight:  Not Applicable  Engagement in Group: Not Applicable  Additional Comments:  Pt remained in bed during group time. Pt encouraged to take shower and change into fresh clothes daughter dropped off. Pt agreed to but remained in bed   Marcille BuffyMcNeil, Gevorg Brum S 05/02/2017, 9:51 PM

## 2017-05-02 NOTE — H&P (Signed)
Psychiatric Admission Assessment Adult  Patient Identification: Shelia Ramos MRN:  [id number] Date of Evaluation:  05/02/2017 Chief Complaint:  MDD WITH PSYCHOTIC FEATURES Principal Diagnosis: Bipolar disorder with psychotic features [facility]) Diagnosis:   Patient Active Problem List   Diagnosis Date Noted  . Major depressive disorder, recurrent episode, severe (HCC) [F33.2] 05/02/2017  . Bunion of great toe of left foot [M21.612] 10/09/2016  . IBS (irritable bowel syndrome) [K58.9] 10/09/2016  . Bipolar disorder with psychotic features (HCC) [F31.9] 10/09/2016  . Perimenopause- on Xulane [N95.1] 10/09/2016  . Insomnia disorder [G47.00] 10/09/2016  . Moderate persistent asthma-  [J45.40] 09/28/2016  . Allergic rhinoconjunctivitis [J30.9, H10.10] 09/28/2016  . Dysphagia [R13.10] 09/28/2016  . Constipation [K59.00] 01/03/2015  . Internal bleeding hemorrhoids [K64.8] 01/03/2015  . Anal bleeding [K62.5] 01/03/2015   History of Present Illness: Patient is 46 year old Caucasian, employed female who was admitted due to having paranoia, hallucination, poor sleep and having thoughts to take her own life.  Patient cut her arm with a knife which required stitches.  She admitted it was a suicidal attempt because she does not want to live anymore.  Patient also reported that she feels people are staring at her and out to get her.  She endorse that she's been hearing voices mostly music.  Patient has history of bipolar disorder and currently seeing a provider at [facility] counseling.  Patient admitted noncompliant with Geodon because she felt that she does not need it.  She was out of the medication for one month until a few days ago she started again.  Patient also had history of anorexia denies any recent binge or purging.  Patient lives with her husband and daughter.  Patient appears very guarded, paranoid with flat affect.  Patient is taking Sinequan to help her insomnia and depression. Associated  Signs/Symptoms: Depression Symptoms:  depressed mood, anhedonia, fatigue, feelings of worthlessness/guilt, difficulty concentrating, hopelessness, recurrent thoughts of death, suicidal thoughts with specific plan, suicidal attempt, anxiety, (Hypo) Manic Symptoms:  Hallucinations, Impulsivity, Anxiety Symptoms:  Excessive Worry, Psychotic Symptoms:  Delusions, Hallucinations: Auditory Paranoia, PTSD Symptoms: NA Total Time spent with patient: 45 minutes  Past Psychiatric History: Patient has history [facility] due to paranoia and depression.  She was given Risperdal Lexapro with good response.  Is the patient at risk to self? Yes.    Has the patient been a risk to self in the past 6 months? Yes.    Has the patient been a risk to self within the distant past? Yes.    Is the patient a risk to others? No.  Has the patient been a risk to others in the past 6 months? No.  Has the patient been a risk to others within the distant past? No.   Prior Inpatient Therapy:   Prior Outpatient Therapy:    Alcohol Screening: 1. How often do you have a drink containing alcohol?: Never 9. Have you or someone else been injured as a result of your drinking?: No 10. Has a relative or friend or a doctor or another health worker been concerned about your drinking or suggested you cut down?: No Alcohol Use Disorder Identification Test Final Score (AUDIT): 0 Brief Intervention: AUDIT score less than 7 or less-screening does not suggest unhealthy drinking-brief intervention not indicated Substance Abuse History in the last 12 months:  No. Consequences of Substance Abuse: NA Previous Psychotropic Medications: Yes  Psychological Evaluations: No  Past Medical History:  Past Medical History:  Diagnosis Date  .  Allergy    SEASONAL  . Anxiety   . Asthma    AS A CHILD  . Depression   . IBS (irritable bowel syndrome)   . Seasonal allergies     Past  Surgical History:  Procedure Laterality Date  . BUNIONECTOMY Right   . COLPOSCOPY     Family History:  Family History  Problem Relation Age of Onset  . Anxiety disorder Mother   . Depression Mother   . Allergic rhinitis Father   . Allergic rhinitis Paternal Aunt   . Allergic rhinitis Paternal Grandmother   . Cancer Paternal Grandmother     stomach  . Heart attack Maternal Grandfather   . Angioedema Neg Hx   . Asthma Neg Hx   . Atopy Neg Hx   . Eczema Neg Hx   . Immunodeficiency Neg Hx   . Urticaria Neg Hx    Family Psychiatric  History: Patient endorse mother has depression. Tobacco Screening: Have you used any form of tobacco in the last 30 days? (Cigarettes, Smokeless Tobacco, Cigars, and/or Pipes): No Social History:  History  Alcohol Use No     History  Drug Use No    Additional Social History:                           Allergies:  No Known Allergies Lab Results:  Results for orders placed or performed during the hospital encounter of 05/01/17 (from the past 48 hour(s))  Lipid panel     Status: Abnormal   Collection Time: 05/02/17  6:20 AM  Result Value Ref Range   Cholesterol 244 (H) 0 - 200 mg/dL   Triglycerides 409 <811 mg/dL   HDL 61 >91 mg/dL   Total CHOL/HDL Ratio 4.0 RATIO   VLDL 22 0 - 40 mg/dL   LDL Cholesterol 478 (H) 0 - 99 mg/dL    Comment:        Total Cholesterol/HDL:CHD Risk Coronary Heart Disease Risk Table                     Men   Women  1/2 Average Risk   3.4   3.3  Average Risk       5.0   4.4  2 X Average Risk   9.6   7.1  3 X Average Risk  23.4   11.0        Use the calculated Patient Ratio above and the CHD Risk Table to determine the patient's CHD Risk.        ATP III CLASSIFICATION (LDL):  <100     mg/dL   Optimal  295-621  mg/dL   Near or Above                    Optimal  130-159  mg/dL   Borderline  308-657  mg/dL   High  >846     mg/dL   Very High Performed at [facility], [address],  [place], [place] [address]   TSH     Status: None   Collection Time: 05/02/17  6:20 AM  Result Value Ref Range   TSH 1.246 0.350 - 4.500 uIU/mL    Comment: Performed by a 3rd Generation assay with a functional sensitivity of <=0.01 uIU/mL. Performed at [facility], [address], [place], [place] [address]     Blood Alcohol level:  Lab Results  Component Value Date  ETH <5 04/30/2017    Metabolic Disorder Labs:  No results found for: HGBA1C, MPG No results found for: PROLACTIN Lab Results  Component Value Date   CHOL 244 (H) 05/02/2017   TRIG 109 05/02/2017   HDL 61 05/02/2017   CHOLHDL 4.0 05/02/2017   VLDL 22 05/02/2017   LDLCALC 161 (H) 05/02/2017    Current Medications: Current Facility-Administered Medications  Medication Dose Route Frequency Provider Last Rate Last Dose  . acetaminophen (TYLENOL) tablet 650 mg  650 mg Oral Q6H PRN Nira Conn A, NP      . albuterol (PROVENTIL HFA;VENTOLIN HFA) 108 (90 Base) MCG/ACT inhaler 2 puff  2 puff Inhalation Q4H PRN Nira Conn A, NP      . alum & mag hydroxide-simeth (MAALOX/MYLANTA) 200-200-20 MG/5ML suspension 30 mL  30 mL Oral Q4H PRN Nira Conn A, NP      . doxepin (SINEQUAN) capsule 150 mg  150 mg Oral QHS Nira Conn A, NP   150 mg at 05/01/17 2340  . feeding supplement (ENSURE ENLIVE) (ENSURE ENLIVE) liquid 237 mL  237 mL Oral BID BM Eappen, Saramma, MD      . magnesium hydroxide (MILK OF MAGNESIA) suspension 30 mL  30 mL Oral Daily PRN Nira Conn A, NP      . montelukast (SINGULAIR) chewable tablet 10 mg  10 mg Oral QHS Nira Conn A, NP       PTA Medications: Facility-Administered Medications Prior to Admission  Medication Dose Route Frequency Provider Last Rate Last Dose  . triamcinolone acetonide (KENALOG-40) injection 20 mg  20 mg Other Once Asencion Islam, DPM       Prescriptions Prior to Admission  Medication Sig Dispense Refill Last Dose  . albuterol (PROAIR HFA) 108 (90 Base)  MCG/ACT inhaler Inhale 2 puffs into the lungs every 4 (four) hours as needed for wheezing or shortness of breath. 1 Inhaler 1 unk  . montelukast (SINGULAIR) 5 MG chewable tablet Take two tablets each evening. 60 tablet 5 Past Week at Unknown time  . Probiotic Product (PROBIOTIC DAILY PO) Take 1 tablet by mouth daily.   Past Month at Unknown time  . ranitidine (ZANTAC) 150 MG tablet Take 1 tablet (150 mg total) by mouth at bedtime. (Patient not taking: Reported on 04/30/2017) 30 tablet 3 Not Taking at Unknown time  . Spacer/Aero-Holding Chambers (AEROCHAMBER PLUS FLO-VU LARGE) MISC Use with MDI as directed. 1 each 1 Taking  . ziprasidone (GEODON) 40 MG capsule Take 40 mg by mouth daily.    04/30/2017 at 1830  . [DISCONTINUED] beclomethasone (QVAR) 80 MCG/ACT inhaler Inhale 2 puffs into the lungs 2 (two) times daily. (Patient not taking: Reported on 04/30/2017) 1 Inhaler 5 Not Taking at Unknown time  . [DISCONTINUED] clonazePAM (KLONOPIN) 0.5 MG tablet Take 0.5 mg by mouth daily as needed for anxiety.   2 unk  . [DISCONTINUED] doxepin (SINEQUAN) 75 MG capsule Take 150 mg by mouth at bedtime.  1 04/29/2017 at Unknown time  . [DISCONTINUED] fluticasone (FLONASE) 50 MCG/ACT nasal spray Use 1-2 sprays each nostril each morning. (Patient not taking: Reported on 04/30/2017) 1 g 5 Not Taking at Unknown time  . [DISCONTINUED] hyoscyamine (LEVSIN SL) 0.125 MG SL tablet PLACE 1 TABLET UNDER TONGUE EVERY 6 HOURS AS NEEDED 60 tablet 3 04/29/2017 at Unknown time  . [DISCONTINUED] Inulin (FIBER CHOICE FRUITY BITES) 1.5 g CHEW Chew 2 Units by mouth daily.   Past Month at Unknown time  . [DISCONTINUED] NONFORMULARY OR COMPOUNDED ITEM  [facility]: Antiinflammatory Cream - Diclofenac 3%, Baclofen 2%, Lidocaine 2%, apply 1-2 grams to affected area 3-4 times daily. (Patient not taking: Reported on 04/30/2017) 480 each 11 Not Taking at Unknown time    Musculoskeletal: Strength & Muscle Tone: within normal limits Gait & Station:  normal Patient leans: N/A  Psychiatric Specialty Exam: Physical Exam  Review of Systems  Constitutional: Negative.   HENT: Negative.   Cardiovascular: Negative.   Skin:       Patient has bandage on her left arm.  She cut herself and requires stitches.  Neurological: Negative.   Psychiatric/Behavioral: Positive for depression and hallucinations. The patient is nervous/anxious and has insomnia.     Blood pressure 105/67, pulse (!) 129, temperature 98.5 F (36.9 C), resp. rate 18, height 5\' 2"  (1.575 m), weight 48.5 kg (107 lb), last menstrual period 04/02/2017.Body mass index is 19.57 kg/m.  General Appearance: Casual and Guarded  Eye Contact:  Fair  Speech:  Slow  Volume:  Decreased  Mood:  Anxious, Depressed, Dysphoric and Hopeless  Affect:  Constricted and Depressed  Thought Process:  Descriptions of Associations: Circumstantial  Orientation:  Full (Time, Place, and Person)  Thought Content:  Delusions, Hallucinations: Auditory Hearing music, Paranoid Ideation and Rumination  Suicidal Thoughts:  Yes.  with intent/plan  Homicidal Thoughts:  No  Memory:  Immediate;   Fair Recent;   Fair Remote;   Fair  Judgement:  Fair  Insight:  Fair  Psychomotor Activity:  Decreased  Concentration:  Concentration: Poor and Attention Span: Fair  Recall:  [employer] of Knowledge:  Fair  Language:  Good  Akathisia:  No  Handed:  Right  AIMS (if indicated):     Assets:  Communication Skills Desire for Improvement Housing  ADL's:  Intact  Cognition:  WNL  Sleep:       Treatment Plan Summary: Daily contact with patient to assess and evaluate symptoms and progress in treatment and Medication management  [facility] Continue Sinequan 150 mg to help the depression or anxiety. Start Risperdal 0.5 mg twice a day which help her paranoia and hallucination in the past. Increase collateral information from the family members. Restart home medication for her health  needs. Encouraged to participate in group milieu therapy. Continue Vistaril and trazodone to help anxiety and insomnia. Social worker to start discharge planning. Review blood work, prolactin, hemoglobin A1c are pending.  TSH 1.24, lipid panel shows cholesterol 244 and LDL 161.  Comprehensive metabolic panel shows CO2 21 and calcium 8.6 otherwise normal.  CBC normal.  Observation Level/Precautions:  Elopement 15 minute checks  Laboratory:  CBC Chemistry Profile Folic Acid GGT HbAIC HCG UDS  Psychotherapy:    Medications:    Consultations:    Discharge Concerns:    Estimated LOS:  Other:     Physician Treatment Plan for Primary Diagnosis: <principal problem not specified> Long Term Goal(s): Improvement in symptoms so as ready for discharge  Short Term Goals: Ability to identify changes in lifestyle to reduce recurrence of condition will improve, Ability to disclose and discuss suicidal ideas, Ability to demonstrate self-control will improve and Ability to identify and develop effective coping behaviors will improve  Physician Treatment Plan for Secondary Diagnosis: Active Problems:   Major depressive disorder, recurrent episode, severe (HCC)  Long Term Goal(s): Improvement in symptoms so as ready for discharge  Short Term Goals: Ability to verbalize feelings will improve, Ability to disclose and discuss suicidal ideas, Ability to demonstrate self-control will  improve, Ability to identify and develop effective coping behaviors will improve, Ability to maintain clinical measurements within normal limits will improve and Compliance with prescribed medications will improve  I certify that inpatient services furnished can reasonably be expected to improve the patient's condition.    Ector Laurel T., MD 5/6/201812:03 PM

## 2017-05-02 NOTE — Progress Notes (Signed)
D.  Pt in bed on approach with pillow over face.  Pt asked why she had it there and Pt states that she is still experiencing voices.  Pt denies this was an attempt to harm herself.  Pt has not left her room today nor has she showered.  Pt did take her bedtime medication but must have it in pudding due to her dysphasia.  Pt can take chewable pills.  Pt denies SI/HI but is still experiencing command hallucinations.  A.  Spoke with NP about voices and one time dose of Zyprexa ordered and given.  Support and encouragement offered to patient and reiterated contract of safety while on unit.  R.  Pt verbally agreed to contract for safety on unit, remains in bed in room.  Pt states that she might like to have a roommate if this opportunity presents.

## 2017-05-03 DIAGNOSIS — F315 Bipolar disorder, current episode depressed, severe, with psychotic features: Secondary | ICD-10-CM | POA: Diagnosis present

## 2017-05-03 LAB — HEMOGLOBIN A1C
HEMOGLOBIN A1C: 5.4 % (ref 4.8–5.6)
MEAN PLASMA GLUCOSE: 108 mg/dL

## 2017-05-03 LAB — PROLACTIN: Prolactin: 9.8 ng/mL (ref 4.8–23.3)

## 2017-05-03 MED ORDER — BENZTROPINE MESYLATE 0.5 MG PO TABS
0.5000 mg | ORAL_TABLET | Freq: Two times a day (BID) | ORAL | Status: DC
  Administered 2017-05-03 – 2017-05-10 (×14): 0.5 mg via ORAL
  Filled 2017-05-03 (×7): qty 1
  Filled 2017-05-03 (×2): qty 14
  Filled 2017-05-03 (×9): qty 1

## 2017-05-03 MED ORDER — DOXEPIN HCL 100 MG PO CAPS: 100.0000 mg | ORAL_CAPSULE | Freq: Every day | ORAL | Status: DC

## 2017-05-03 MED ORDER — LORAZEPAM 1 MG PO TABS
1.0000 mg | ORAL_TABLET | Freq: Once | ORAL | Status: AC
  Administered 2017-05-03: 1 mg via ORAL
  Filled 2017-05-03: qty 1

## 2017-05-03 MED ORDER — LAMOTRIGINE 25 MG PO TABS
25.0000 mg | ORAL_TABLET | Freq: Every evening | ORAL | Status: DC
  Administered 2017-05-03 – 2017-05-07 (×5): 25 mg via ORAL
  Filled 2017-05-03 (×7): qty 1

## 2017-05-03 MED ORDER — OLANZAPINE 5 MG PO TBDP
5.0000 mg | ORAL_TABLET | Freq: Once | ORAL | Status: AC
  Administered 2017-05-03: 5 mg via ORAL
  Filled 2017-05-03: qty 1

## 2017-05-03 MED ORDER — PANTOPRAZOLE SODIUM 20 MG PO TBEC
20.0000 mg | DELAYED_RELEASE_TABLET | Freq: Every day | ORAL | Status: DC
  Administered 2017-05-03 – 2017-05-10 (×8): 20 mg via ORAL
  Filled 2017-05-03 (×7): qty 1
  Filled 2017-05-03: qty 7
  Filled 2017-05-03 (×5): qty 1

## 2017-05-03 MED ORDER — MIRTAZAPINE 7.5 MG PO TABS
7.5000 mg | ORAL_TABLET | Freq: Every day | ORAL | Status: DC
  Administered 2017-05-03: 7.5 mg via ORAL
  Filled 2017-05-03 (×3): qty 1

## 2017-05-03 MED ORDER — OLANZAPINE 5 MG PO TBDP
ORAL_TABLET | ORAL | Status: AC
  Filled 2017-05-03: qty 1

## 2017-05-03 MED ORDER — ENSURE ENLIVE PO LIQD
237.0000 mL | Freq: Three times a day (TID) | ORAL | Status: DC
  Administered 2017-05-03 – 2017-05-10 (×12): 237 mL via ORAL

## 2017-05-03 MED ORDER — DOXEPIN HCL 50 MG PO CAPS
50.0000 mg | ORAL_CAPSULE | Freq: Every day | ORAL | Status: DC
  Administered 2017-05-03: 50 mg via ORAL
  Filled 2017-05-03 (×3): qty 1

## 2017-05-03 MED ORDER — PROPRANOLOL HCL 10 MG PO TABS
10.0000 mg | ORAL_TABLET | Freq: Three times a day (TID) | ORAL | Status: DC
  Administered 2017-05-03 – 2017-05-05 (×7): 10 mg via ORAL
  Filled 2017-05-03 (×13): qty 1

## 2017-05-03 NOTE — Progress Notes (Signed)
Recreation Therapy Notes  Date: 05/03/17 Time: 1000 Location: 300 Hall Dayroom  Group Topic: Coping Skills  Goal Area(s) Addresses:  Patients will be able to identify positive coping skills. Patients will be able the benefits of positive coping skills. Patients will be able to explain the benefits of using coping skills post d/c.  Behavioral Response: Engaged  Intervention: Physicist, medicalBlank web design, pencils  Activity: OrthoptistWeb Design.  Patients were to picture themselves as being stuck in the center of the web.  Patients were to then write the things they feel have gotten them "stuck" inside the web design.  Patients were to then come up with at least two coping skills for each stressor they identified.   Education: PharmacologistCoping Skills, Building control surveyorDischarge Planning.   Education Outcome: Acknowledges understanding/In group clarification offered/Needs additional education.   Clinical Observations/Feedback: Pt seemed unable to comprehend to the basis of the group.  Pt identified anxiety, depression and hopelessness as things that got her here.  Some of her coping skills included spending more time with her family and friends, being at home more and show more love.   Caroll RancherMarjette Tyreshia Ingman, LRT/CTRS         Caroll RancherLindsay, Zyrion Coey A 05/03/2017 12:54 PM

## 2017-05-03 NOTE — [facility] Group Notes (Signed)
[facility] LCSW Group Therapy  05/03/2017 1:15 pm  Type of Therapy: Process Group Therapy  Participation Level:  Active  Participation Quality:  Appropriate  Affect:  Flat  Cognitive:  Oriented  Insight:  Improving  Engagement in Group:  Limited  Engagement in Therapy:  Limited  Modes of Intervention:  Activity, Clarification, Education, Problem-solving and Support  Summary of Progress/Problems: Today's group addressed the issue of overcoming obstacles.  Patients were asked to identify their biggest obstacle post d/c that stands in the way of their on-going success, and then problem solve as to how to manage this. Stayed for about half of the group, left and did not return.  Did not participate while present.  Shelia Ramos, Shelia Ramos 05/03/2017   3:25 PM

## 2017-05-03 NOTE — [facility] Suicide Risk Assessment (Signed)
[facility] INPATIENT:  Family/Significant Other Suicide Prevention Education  Suicide Prevention Education:  Education Completed; Shelia Ramos (Pt's spouse) [phone] has been identified by the patient as the family member/significant other with whom the patient will be residing, and identified as the person(s) who will aid the patient in the event of a mental health crisis (suicidal ideations/suicide attempt).  With written consent from the patient, the family member/significant other has been provided the following suicide prevention education, prior to the and/or following the discharge of the patient.  The suicide prevention education provided includes the following:  Suicide risk factors  Suicide prevention and interventions  National Suicide Hotline telephone number  [facility] assessment telephone number  [facility] Emergency Assistance 911  [facility] and/or Residential Mobile Crisis Unit telephone number  Request made of family/significant other to:  Remove weapons (e.g., guns, rifles, knives), all items previously/currently identified as safety concern.    Remove drugs/medications (over-the-counter, prescriptions, illicit drugs), all items previously/currently identified as a safety concern.  The family member/significant other verbalizes understanding of the suicide prevention education information provided.  The family member/significant other agrees to remove the items of safety concern listed above.  Shelia Ramos Smart LCSW 05/03/2017, 3:57 PM

## 2017-05-03 NOTE — Progress Notes (Signed)
Recreation Therapy Notes  INPATIENT RECREATION THERAPY ASSESSMENT  Patient Details Name: Darrel HooverKimberly D Gorgas MRN: [id number] DOB: [birth year] Today's Date: 05/03/2017  Patient Stressors: Work, Other (Comment) (Anxiety)  Pt stated she was here for seeing things, cutting her arm and thought people were staring at her.  Coping Skills:   Isolate, Arguments, Avoidance, Exercise, Art/Dance, Talking  Personal Challenges: Anger, Communication, Concentration, Decision-Making, Expressing Yourself, Problem-Solving, Relationships, Self-Esteem/Confidence, Social Interaction, Stress Management, Time Management, Trusting Others  Leisure Interests (2+):  Individual - TV, Individual - Reading, Social - Family, Exercise - Walking  Awareness of Community Resources:  Yes  Community Resources:  Other (Comment) ([place])  Current Use: No  If no, Barriers?: Other (Comment)  Patient Strengths:  Getting tasks done, being dependable  Patient Identified Areas of Improvement:  Anxiety, family and friends (more time with them)  Current Recreation Participation:  Twice a week  Patient Goal for Hospitalization:  "To get better, come out wtih kindness, meet new people and get out of shell"  [place]  County of Residence:  [place]  Current SI (including self-harm):  No  Current HI:  No  Consent to Intern Participation: N/A  Caroll RancherMarjette Lyrique Hakim, LRT/CTRS  Caroll RancherLindsay, Suleiman Finigan A 05/03/2017, 2:24 PM
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
## 2017-05-03 NOTE — Progress Notes (Signed)
Patient ID: Shelia HooverKimberly D Ramos, female   DOB: Aug 20, 1971, 46 y.o.   MRN: [id number]  Pt currently presents with a flat affect and guarded behavior. Pt forwards little to writer, is able to follow commands. Pt states "I am just tired." Pt reports good sleep with current medication regimen. Pt remains in room tonight, does not interact with other patients.   Pt provided with medications and nutritional supplement per providers orders. Pt's labs and vitals were monitored throughout the night. Pt given a 1:1 about emotional and mental status. Pt supported and encouraged to express concerns and questions. Pt educated on medications and psychosis.   Pt's safety ensured with 15 minute and environmental checks. Pt currently denies SI/HI. Pt does report visual hallucinations of "dark shadows" but says that they do not bother her. Pt has a history of auditory hallucinations during admission, currently denies. Pt verbally agrees to seek staff if SI/HI occurs, A/VH worsens and to consult with staff before acting on any harmful thoughts. Will continue POC.

## 2017-05-03 NOTE — Progress Notes (Signed)
NUTRITION ASSESSMENT  Pt identified as at risk on the Malnutrition Screen Tool  INTERVENTION: 1. Educated patient on the importance of nutrition and encouraged intake of food and beverages. 2. Discussed weight goals. 3. Supplements: continue Ensure Enlive BID, each supplement provides 350 kcal and 20 grams of protein.  NUTRITION DIAGNOSIS: Unintentional weight loss related to sub-optimal intake as evidenced by pt report.   Goal: Pt to meet >/= 90% of their estimated nutrition needs.  Monitor:  PO intake  Assessment:  Pt admitted following cutting L forearm after having auditory hallucinations while watching TV. Pt with hx of atypical depression with psychotic features. Pt reports that she is a vegetarian and mainly consumes dairy items and vegetables. Pt with hx of dysphagia and needs medications in pudding but able to take chewable medications.   Per review, pt has lost 16 lbs (13% body weight) in the past 1 month which is significant for time frame. Ensure Shiela Mayernlive has already been ordered BID. Continue to encourage PO intakes of meals, supplements, and snacks.    46 y.o. female  Height: Ht Readings from Last 1 Encounters:  05/01/17 5\' 2"  (1.575 m)    Weight: Wt Readings from Last 1 Encounters:  05/01/17 107 lb (48.5 kg)    Weight Hx: Wt Readings from Last 10 Encounters:  05/01/17 107 lb (48.5 kg)  04/07/17 123 lb (55.8 kg)  12/04/16 121 lb 8 oz (55.1 kg)  10/09/16 119 lb (54 kg)  09/28/16 121 lb 6.4 oz (55.1 kg)  05/01/16 119 lb 0.8 oz (54 kg)  02/11/16 119 lb 4 oz (54.1 kg)  01/03/15 112 lb (50.8 kg)  11/01/14 113 lb (51.3 kg)  10/30/14 113 lb (51.3 kg)    BMI:  Body mass index is 19.57 kg/m. Pt meets criteria for normal weight based on current BMI.  Estimated Nutritional Needs: Kcal: 25-30 kcal/kg Protein: > 1 gram protein/kg Fluid: 1 ml/kcal  Diet Order: Diet regular Room service appropriate? Yes; Fluid consistency: Thin Pt is also offered choice of  unit snacks mid-morning and mid-afternoon.  Pt is eating as desired.   Lab results and medications reviewed.     Trenton GammonJessica Carrye Goller, MS, RD, LDN, [facility] Inpatient Clinical Dietitian Pager # [phone] After hours/weekend pager # [phone]

## 2017-05-03 NOTE — Progress Notes (Signed)
[facility] Group Notes:  (Nursing/MHT/Case Management/Adjunct)  Date:  05/03/2017  Time:  11:37 PM  Type of Therapy:  Psychoeducational Skills  Participation Level:  Active  Participation Quality:  Attentive  Affect:  Appropriate  Cognitive:  Lacking  Insight:  Limited  Engagement in Group:  Developing/Improving  Modes of Intervention:  Education  Summary of Progress/Problems: Patient described her day as having been "okay" since she worked on word searches throughout the day. As for the theme of the day, her wellness strategy will be to eat healthier and get more exercise.   Hazle CocaGOODMAN, Shelia Ramos 05/03/2017, 11:37 PM

## 2017-05-03 NOTE — Progress Notes (Addendum)
Patient ID: Shelia Ramos, female   DOB: [birth year], 46 y.o.   MRN: [id number]  DAR: Pt. Denies SI/HI and visual Hallucinations. She does report that her auditory hallucinations do continue in the form of voices. She does appear preoccupied when speaking. She reports sleep is good, appetite is poor, energy level is low, and concentration is poor. She rates depression 5/10, hopelessness 10/10, and anxiety 5/10. Patient does report some itching with her sutures. Writer cleaned and changed patient's bandage. The area is now clean, dry, and bandage is intact. Support and encouragement provided to the patient to be present in the milieu however patient remains isolated to her room throughout the day. Patient appears frightful, preoccupied, and anxious. Her pulse is elevated throughout the day, MD Eappen was notified. She reports some tremors, dizziness, and lightheadedness which may be due to her elevated pulse. Scheduled medications administered to patient per physician's orders. Q15 minute checks are maintained for safety.

## 2017-05-03 NOTE — Progress Notes (Signed)
[facility] MD Progress Note  05/03/2017 2:43 PM Shelia Ramos  MRN:  [id number] Subjective:  Patient states " I felt like the TV is talking to me , I feel better on the medications. But I still do not sleep."  Objective:Patient seen and chart reviewed.Discussed patient with treatment team.  Pt today seen as calm, withdrawn , reports continued struggles with sleep. She is tolerating the risperidone well, denies any ADRs. She reports she has difficulty swallowing some food items and hence is picky with how she takes her meals , she reports she is OK taking black beans , mashed potatoes , rice, bread and so on. Continue to observe, reassure and support.  Per staff she takes her medications with pudding and has been taking it OK. Pt has been making an attempt to attend groups.     Principal Problem: Bipolar disorder, curr episode depressed, severe, w/psychotic features (HCC) Diagnosis:   Patient Active Problem List   Diagnosis Date Noted  . Bipolar disorder, curr episode depressed, severe, w/psychotic features (HCC) [F31.5] 05/03/2017  . Bunion of great toe of left foot [M21.612] 10/09/2016  . IBS (irritable bowel syndrome) [K58.9] 10/09/2016  . Perimenopause- on Xulane [N95.1] 10/09/2016  . Insomnia disorder [G47.00] 10/09/2016  . Moderate persistent asthma-  [J45.40] 09/28/2016  . Allergic rhinoconjunctivitis [J30.9, H10.10] 09/28/2016  . Dysphagia [R13.10] 09/28/2016  . Constipation [K59.00] 01/03/2015  . Internal bleeding hemorrhoids [K64.8] 01/03/2015  . Anal bleeding [K62.5] 01/03/2015   Total Time spent with patient: 25 minutes  Past Psychiatric History: Please see H&P.   Past Medical History:  Past Medical History:  Diagnosis Date  . Allergy    SEASONAL  . Anxiety   . Asthma    AS A CHILD  . Depression   . IBS (irritable bowel syndrome)   . Seasonal allergies     Past Surgical History:  Procedure Laterality Date  . BUNIONECTOMY Right   . COLPOSCOPY     Family  History:  Family History  Problem Relation Age of Onset  . Anxiety disorder Mother   . Depression Mother   . Allergic rhinitis Father   . Allergic rhinitis Paternal Aunt   . Allergic rhinitis Paternal Grandmother   . Cancer Paternal Grandmother     stomach  . Heart attack Maternal Grandfather   . Angioedema Neg Hx   . Asthma Neg Hx   . Atopy Neg Hx   . Eczema Neg Hx   . Immunodeficiency Neg Hx   . Urticaria Neg Hx    Family Psychiatric  History: Please see H&P.  Social History: Please see H&P.  History  Alcohol Use No     History  Drug Use No    Social History   Social History  . Marital status: Married    Spouse name: N/A  . Number of children: N/A  . Years of education: N/A   Social History Main Topics  . Smoking status: Never Smoker  . Smokeless tobacco: Never Used  . Alcohol use No  . Drug use: No  . Sexual activity: Yes    Birth control/ protection: Patch   Other Topics Concern  . None   Social History Narrative  . None   Additional Social History:                         Sleep: Poor  Appetite:  improving  Current Medications: Current Facility-Administered Medications  Medication Dose Route Frequency Provider Last Rate  Last Dose  . acetaminophen (TYLENOL) tablet 650 mg  650 mg Oral Q6H PRN Nira Conn A, NP      . albuterol (PROVENTIL HFA;VENTOLIN HFA) 108 (90 Base) MCG/ACT inhaler 2 puff  2 puff Inhalation Q4H PRN Nira Conn A, NP      . alum & mag hydroxide-simeth (MAALOX/MYLANTA) 200-200-20 MG/5ML suspension 30 mL  30 mL Oral Q4H PRN Nira Conn A, NP      . doxepin (SINEQUAN) capsule 50 mg  50 mg Oral QHS Theoplis Garciagarcia, MD      . feeding supplement (ENSURE ENLIVE) (ENSURE ENLIVE) liquid 237 mL  237 mL Oral TID BM Daneesha Quinteros, MD      . lamoTRIgine (LAMICTAL) tablet 25 mg  25 mg Oral QPM Laraina Sulton, MD      . magnesium hydroxide (MILK OF MAGNESIA) suspension 30 mL  30 mL Oral Daily PRN Nira Conn A, NP      .  mirtazapine (REMERON) tablet 7.5 mg  7.5 mg Oral QHS Lyrika Souders, MD      . montelukast (SINGULAIR) chewable tablet 10 mg  10 mg Oral QHS Nira Conn A, NP   10 mg at 05/02/17 2104  . propranolol (INDERAL) tablet 10 mg  10 mg Oral TID Jomarie Longs, MD   10 mg at 05/03/17 1316  . risperiDONE (RISPERDAL) tablet 0.5 mg  0.5 mg Oral BID Cleotis Nipper, MD   0.5 mg at 05/03/17 4782    Lab Results:  Results for orders placed or performed during the hospital encounter of 05/01/17 (from the past 48 hour(s))  Hemoglobin A1c     Status: None   Collection Time: 05/02/17  6:20 AM  Result Value Ref Range   Hgb A1c MFr Bld 5.4 4.8 - 5.6 %    Comment: (NOTE)         Pre-diabetes: 5.7 - 6.4         Diabetes: >6.4         Glycemic control for adults with diabetes: <7.0    Mean Plasma Glucose 108 mg/dL    Comment: (NOTE) Performed At: [facility] [address] [place], [place] [id number] Mila Homer MD VH:[id number] Performed at [facility], [address], [place], [place] [address]   Lipid panel     Status: Abnormal   Collection Time: 05/02/17  6:20 AM  Result Value Ref Range   Cholesterol 244 (H) 0 - 200 mg/dL   Triglycerides 401 <027 mg/dL   HDL 61 >25 mg/dL   Total CHOL/HDL Ratio 4.0 RATIO   VLDL 22 0 - 40 mg/dL   LDL Cholesterol 366 (H) 0 - 99 mg/dL    Comment:        Total Cholesterol/HDL:CHD Risk Coronary Heart Disease Risk Table                     Men   Women  1/2 Average Risk   3.4   3.3  Average Risk       5.0   4.4  2 X Average Risk   9.6   7.1  3 X Average Risk  23.4   11.0        Use the calculated Patient Ratio above and the CHD Risk Table to determine the patient's CHD Risk.        ATP III CLASSIFICATION (LDL):  <100     mg/dL   Optimal  440-347  mg/dL   Near or  Above                    Optimal  130-159  mg/dL   Borderline  [id number]  mg/dL   High  >981>190     mg/dL   Very High Performed at [facility], [address] [place], [place] [address]   TSH     Status: None   Collection Time: 05/02/17  6:20 AM  Result Value Ref Range   TSH 1.246 0.350 - 4.500 uIU/mL    Comment: Performed by a 3rd Generation assay with a functional sensitivity of <=0.01 uIU/mL. Performed at [facility], [address] [place], [place] [address]   Prolactin     Status: None   Collection Time: 05/02/17  6:20 AM  Result Value Ref Range   Prolactin 9.8 4.8 - 23.3 ng/mL    Comment: (NOTE) Performed At: [facility] [address] [place], [place] [id number] Mila HomerHancock William F MD IO:[id number]:[phone] Performed at [facility], [address] [place], [place] [address]     Blood Alcohol level:  Lab Results  Component Value Date   ETH <5 04/30/2017    Metabolic Disorder Labs: Lab Results  Component Value Date   HGBA1C 5.4 05/02/2017   MPG 108 05/02/2017   Lab Results  Component Value Date   PROLACTIN 9.8 05/02/2017   Lab Results  Component Value Date   CHOL 244 (H) 05/02/2017   TRIG 109 05/02/2017   HDL 61 05/02/2017   CHOLHDL 4.0 05/02/2017   VLDL 22 05/02/2017   LDLCALC 161 (H) 05/02/2017    Physical Findings: AIMS: Facial and Oral Movements Muscles of Facial Expression: None, normal Lips and Perioral Area: None, normal Jaw: None, normal Tongue: None, normal,Extremity Movements Upper (arms, wrists, hands, fingers): None, normal Lower (legs, knees, ankles, toes): None, normal, Trunk Movements Neck, shoulders, hips: None, normal, Overall Severity Severity of abnormal movements (highest score from questions above): None, normal Incapacitation due to abnormal movements: None, normal Patient's awareness of abnormal movements (rate only patient's report): No Awareness, Dental Status Current problems with teeth and/or dentures?: No Does patient usually wear dentures?: No  CIWA:    COWS:     Musculoskeletal: Strength & Muscle Tone: within normal limits Gait &  Station: normal Patient leans: N/A  Psychiatric Specialty Exam: Physical Exam  Nursing note and vitals reviewed.   Review of Systems  All other systems reviewed and are negative.   Blood pressure 106/70, pulse (!) 152, temperature 98 F (36.7 C), temperature source Oral, resp. rate 18, height 5\' 2"  (1.575 m), weight 48.5 kg (107 lb).Body mass index is 19.57 kg/m.  General Appearance: Casual  Eye Contact:  Fair  Speech:  Clear and Coherent  Volume:  Normal  Mood:  Depressed and Dysphoric  Affect:  Congruent  Thought Process:  Goal Directed and Descriptions of Associations: Circumstantial  Orientation:  Full (Time, Place, and Person)  Thought Content:  Delusions, Paranoid Ideation and Rumination  Suicidal Thoughts:  No  Homicidal Thoughts:  No  Memory:  Immediate;   Fair Recent;   Fair Remote;   Fair  Judgement:  Impaired  Insight:  Shallow  Psychomotor Activity:  Normal  Concentration:  Concentration: Fair and Attention Span: Fair  Recall:  Fis[employer]Fair  Fund of Knowledge:  Fair  Language:  Fair  Akathisia:  No  Handed:  Right  AIMS (if indicated):     Assets:  Communication Skills Desire for Improvement  ADL's:  Intact  Cognition:  WNL  Sleep:  Number of Hours: 3.5    Bipolar disorder, curr episode depressed, severe, w/psychotic features (HCC) unstable  Will continue today 05/03/17  plan as below except where it is noted.   Treatment Plan Summary:Patient with hx of bipolar do , noncompliance with geodon, as well as has hx of anorexia , is very picky with her meals , presented with recent suicide attempt by cutting self , continue to have sleep issues, will make medication changes.  Daily contact with patient to assess and evaluate symptoms and progress in treatment, Medication management and Plan see below  Will continue Risperidone 0.5 mg po bid for psychosis. Will continue Cogentin 0.5 mg po bid for EPS. Will reduce doxepin to 50 mg po qhs for lack of efficacy, start  Remeron 7.5 mg po qhs for insomnia. Will add Lamictal 25 mg po daily for mood sx. Will add Propranolol 10 mg po tid for tachycardia. Encourage PO fluids. Will get dietician consult for dietary needs. Reviewed labs - tsh - wnl, hba1c, pl - wnl. CSW will work on disposition.    Kyrsten Deleeuw, MD 05/03/2017, 2:43 PM

## 2017-05-03 NOTE — Tx Team (Signed)
Interdisciplinary Treatment and Diagnostic Plan Update  05/03/2017 Time of Session: 0930 Shelia HooverKimberly D Ramos MRN: [id number]  Principal Diagnosis: Bipolar disorder with psychotic features [facility](HCC)  Secondary Diagnoses: Principal Problem:   Bipolar disorder with psychotic features (HCC) Active Problems:   Major depressive disorder, recurrent episode, severe (HCC)   Current Medications:  Current Facility-Administered Medications  Medication Dose Route Frequency Provider Last Rate Last Dose  . acetaminophen (TYLENOL) tablet 650 mg  650 mg Oral Q6H PRN Nira ConnBerry, Jason A, NP      . albuterol (PROVENTIL HFA;VENTOLIN HFA) 108 (90 Base) MCG/ACT inhaler 2 puff  2 puff Inhalation Q4H PRN Nira ConnBerry, Jason A, NP      . alum & mag hydroxide-simeth (MAALOX/MYLANTA) 200-200-20 MG/5ML suspension 30 mL  30 mL Oral Q4H PRN Nira ConnBerry, Jason A, NP      . doxepin (SINEQUAN) capsule 150 mg  150 mg Oral QHS Nira ConnBerry, Jason A, NP   150 mg at 05/02/17 2103  . feeding supplement (ENSURE ENLIVE) (ENSURE ENLIVE) liquid 237 mL  237 mL Oral BID BM Eappen, Saramma, MD   237 mL at 05/03/17 0811  . magnesium hydroxide (MILK OF MAGNESIA) suspension 30 mL  30 mL Oral Daily PRN Nira ConnBerry, Jason A, NP      . montelukast (SINGULAIR) chewable tablet 10 mg  10 mg Oral QHS Nira ConnBerry, Jason A, NP   10 mg at 05/02/17 2104  . risperiDONE (RISPERDAL) tablet 0.5 mg  0.5 mg Oral BID Arfeen, Phillips GroutSyed T, MD   0.5 mg at 05/03/17 [id number]   PTA Medications: Facility-Administered Medications Prior to Admission  Medication Dose Route Frequency Provider Last Rate Last Dose  . [DISCONTINUED] triamcinolone acetonide (KENALOG-40) injection 20 mg  20 mg Other Once Asencion IslamStover, Titorya, DPM       Prescriptions Prior to Admission  Medication Sig Dispense Refill Last Dose  . albuterol (PROAIR HFA) 108 (90 Base) MCG/ACT inhaler Inhale 2 puffs into the lungs every 4 (four) hours as needed for wheezing or shortness of breath. 1 Inhaler 1 unk  . montelukast (SINGULAIR) 5 MG chewable  tablet Take two tablets each evening. 60 tablet 5 Past Week at Unknown time  . [DISCONTINUED] beclomethasone (QVAR) 80 MCG/ACT inhaler Inhale 2 puffs into the lungs 2 (two) times daily. (Patient not taking: Reported on 04/30/2017) 1 Inhaler 5 Not Taking at Unknown time  . [DISCONTINUED] clonazePAM (KLONOPIN) 0.5 MG tablet Take 0.5 mg by mouth daily as needed for anxiety.   2 unk  . [DISCONTINUED] doxepin (SINEQUAN) 75 MG capsule Take 150 mg by mouth at bedtime.  1 04/29/2017 at Unknown time  . [DISCONTINUED] fluticasone (FLONASE) 50 MCG/ACT nasal spray Use 1-2 sprays each nostril each morning. (Patient not taking: Reported on 04/30/2017) 1 g 5 Not Taking at Unknown time  . [DISCONTINUED] hyoscyamine (LEVSIN SL) 0.125 MG SL tablet PLACE 1 TABLET UNDER TONGUE EVERY 6 HOURS AS NEEDED 60 tablet 3 04/29/2017 at Unknown time  . [DISCONTINUED] Inulin (FIBER CHOICE FRUITY BITES) 1.5 g CHEW Chew 2 Units by mouth daily.   Past Month at Unknown time  . [DISCONTINUED] NONFORMULARY OR COMPOUNDED ITEM [facility]: Antiinflammatory Cream - Diclofenac 3%, Baclofen 2%, Lidocaine 2%, apply 1-2 grams to affected area 3-4 times daily. (Patient not taking: Reported on 04/30/2017) 480 each 11 Not Taking at Unknown time  . [DISCONTINUED] Probiotic Product (PROBIOTIC DAILY PO) Take 1 tablet by mouth daily.   Past Month at Unknown time  . [DISCONTINUED] ranitidine (ZANTAC) 150 MG tablet Take 1 tablet (150  mg total) by mouth at bedtime. (Patient not taking: Reported on 04/30/2017) 30 tablet 3 Not Taking at Unknown time  . [DISCONTINUED] Spacer/Aero-Holding Chambers (AEROCHAMBER PLUS FLO-VU LARGE) MISC Use with MDI as directed. 1 each 1 Taking  . [DISCONTINUED] ziprasidone (GEODON) 40 MG capsule Take 40 mg by mouth daily.    04/30/2017 at 1830    Patient Stressors: Medication change or noncompliance Traumatic event  Patient Strengths: Average or above average intelligence Supportive family/friends  Treatment Modalities: Medication  Management, Group therapy, Case management,  1 to 1 session with clinician, Psychoeducation, Recreational therapy.   Physician Treatment Plan for Primary Diagnosis: Bipolar disorder with psychotic features (HCC) Long Term Goal(s): Improvement in symptoms so as ready for discharge Improvement in symptoms so as ready for discharge   Short Term Goals: Ability to identify changes in lifestyle to reduce recurrence of condition will improve Ability to disclose and discuss suicidal ideas Ability to demonstrate self-control will improve Ability to identify and develop effective coping behaviors will improve Ability to verbalize feelings will improve Ability to disclose and discuss suicidal ideas Ability to demonstrate self-control will improve Ability to identify and develop effective coping behaviors will improve Ability to maintain clinical measurements within normal limits will improve Compliance with prescribed medications will improve  Medication Management: Evaluate patient's response, side effects, and tolerance of medication regimen.  Therapeutic Interventions: 1 to 1 sessions, Unit Group sessions and Medication administration.  Evaluation of Outcomes: Progressing  Physician Treatment Plan for Secondary Diagnosis: Principal Problem:   Bipolar disorder with psychotic features (HCC) Active Problems:   Major depressive disorder, recurrent episode, severe (HCC)  Long Term Goal(s): Improvement in symptoms so as ready for discharge Improvement in symptoms so as ready for discharge   Short Term Goals: Ability to identify changes in lifestyle to reduce recurrence of condition will improve Ability to disclose and discuss suicidal ideas Ability to demonstrate self-control will improve Ability to identify and develop effective coping behaviors will improve Ability to verbalize feelings will improve Ability to disclose and discuss suicidal ideas Ability to demonstrate self-control will  improve Ability to identify and develop effective coping behaviors will improve Ability to maintain clinical measurements within normal limits will improve Compliance with prescribed medications will improve     Medication Management: Evaluate patient's response, side effects, and tolerance of medication regimen.  Therapeutic Interventions: 1 to 1 sessions, Unit Group sessions and Medication administration.  Evaluation of Outcomes: Progressing   RN Treatment Plan for Primary Diagnosis: Bipolar disorder with psychotic features (HCC) Long Term Goal(s): Knowledge of disease and therapeutic regimen to maintain health will improve  Short Term Goals: Ability to remain free from injury will improve, Ability to verbalize feelings will improve and Ability to disclose and discuss suicidal ideas  Medication Management: RN will administer medications as ordered by provider, will assess and evaluate patient's response and provide education to patient for prescribed medication. RN will report any adverse and/or side effects to prescribing provider.  Therapeutic Interventions: 1 on 1 counseling sessions, Psychoeducation, Medication administration, Evaluate responses to treatment, Monitor vital signs and CBGs as ordered, Perform/monitor CIWA, COWS, AIMS and Fall Risk screenings as ordered, Perform wound care treatments as ordered.  Evaluation of Outcomes: Progressing   LCSW Treatment Plan for Primary Diagnosis: Bipolar disorder with psychotic features [facility]) Long Term Goal(s): Safe transition to appropriate next level of care at discharge, Engage patient in therapeutic group addressing interpersonal concerns.  Short Term Goals: Engage patient in aftercare planning with referrals and resources,  Facilitate patient progression through stages of change regarding substance use diagnoses and concerns and Identify triggers associated with mental health/substance abuse issues  Therapeutic Interventions: Assess  for all discharge needs, 1 to 1 time with Social worker, Explore available resources and support systems, Assess for adequacy in community support network, Educate family and significant other(s) on suicide prevention, Complete Psychosocial Assessment, Interpersonal group therapy.  Evaluation of Outcomes: Progressing   Progress in Treatment: Attending groups: No. New to unit. Continuing to assess.  Participating in groups: No. Taking medication as prescribed: Yes. Toleration medication: Yes. Family/Significant other contact made: No, will contact:  family member if patient consents Patient understands diagnosis: Yes. Discussing patient identified problems/goals with staff: Yes. Medical problems stabilized or resolved: Yes Denies suicidal/homicidal ideation: No. Passive SI/Able to contract for safety on the unit.  Issues/concerns per patient self-inventory: No. Other: n/a   New problem(s) identified: No, Describe:  n/a   New Short Term/Long Term Goal(s): elimination of SI thoughts; decrease in depressive symptoms and paranoia, development of comprehensive mental wellness plan.   Discharge Plan or Barriers: CSW assessing for appropriate referrals. [facility] is pt's current provider.   Reason for Continuation of Hospitalization: Depression Medication stabilization Suicidal ideation Other; describe paranoia  Estimated Length of Stay: 3-5 days   Attendees: Patient: 05/03/2017 8:43 AM  Physician: Dr. Elna Breslow MD 05/03/2017 8:43 AM  Nursing: Irven Coe RN 05/03/2017 8:43 AM  RN Care Manager:Jennifer Clark CM 05/03/2017 8:43 AM  Social Worker: Trula Slade, LCSW 05/03/2017 8:43 AM  Recreational Therapist: Juliann Pares 05/03/2017 8:43 AM  Other: Armandina Stammer NP; Gray Bernhardt NP 05/03/2017 8:43 AM  Other:  05/03/2017 8:43 AM  Other: 05/03/2017 8:43 AM    Scribe for Treatment Team: Ledell Peoples Smart, LCSW 05/03/2017 8:43 AM

## 2017-05-04 MED ORDER — MIRTAZAPINE 15 MG PO TABS
15.0000 mg | ORAL_TABLET | Freq: Every day | ORAL | Status: DC
  Administered 2017-05-04 – 2017-05-09 (×6): 15 mg via ORAL
  Filled 2017-05-04 (×2): qty 1
  Filled 2017-05-04: qty 7
  Filled 2017-05-04 (×5): qty 1

## 2017-05-04 NOTE — Progress Notes (Signed)
Recreation Therapy Notes  Date: 05/04/17 Time: 1000 Location: [address] Dayroom  Group Topic: Self-Esteem  Goal Area(s) Addresses:  Patient will identify positive ways to increase self-esteem. Patient will verbalize benefit of increased self-esteem.  Intervention: Holiday representativeConstruction paper, markers  Activity: Personalized Plates.  Patients were to design personalized license plates that highlights their uniqueness.  Patients could show important dates, important events, things that are special to them or hidden talents they may have.  Education: Self-Esteem, Building control surveyorDischarge Planning.   Education Outcome: Acknowledges education/In group clarification offered/Needs additional education  Clinical Observations/Feedback: Pt did not attend group.   Caroll RancherMarjette Keeshia Sanderlin, LRT/CTRS         Caroll RancherLindsay, Rowen Wilmer A 05/04/2017 12:05 PM

## 2017-05-04 NOTE — Progress Notes (Signed)
D:Pt reports that she could see shadows last night. She reports that the hallucinations are decreasing and says that the groups and being able to sleep are helping. Pt does have mild thought blocking when interacting. A:Offered support, encouragement and 15 minute checks.  R:Pt denies si and hi. Safety maintained on the unit.

## 2017-05-04 NOTE — Plan of Care (Signed)
Problem: Education: Goal: Emotional status will improve Outcome: Progressing Pt's paranoia is decreasing and sleep improving.   Problem: Safety: Goal: Periods of time without injury will increase Outcome: Progressing No si observed or expressed.

## 2017-05-04 NOTE — Progress Notes (Signed)
[facility]  05/04/2017 3:09 PM Shelia Ramos  MRN:  [id number] Subjective:  Pt states " I am ok. I feel that my problems with swallowing has improved. I will try to eat more meals ."  Objective:Patient seen and chart reviewed.Discussed patient with treatment team.  Pt today seen as anxious , but improving. She does have on and off VH of shadows , but denies it at this time. She reports she is tolerating her medications well, denies ADRs. Per staff - she is making progress , continues to need encouragement. CSW was able to speak to husband who feels pt is improving.   Principal Problem: Bipolar disorder, curr episode depressed, severe, w/psychotic features (HCC) Diagnosis:   Patient Active Problem List   Diagnosis Date Noted  . Bipolar disorder, curr episode depressed, severe, w/psychotic features (HCC) [F31.5] 05/03/2017  . Bunion of great toe of left foot [M21.612] 10/09/2016  . IBS (irritable bowel syndrome) [K58.9] 10/09/2016  . Perimenopause- on Xulane [N95.1] 10/09/2016  . Insomnia disorder [G47.00] 10/09/2016  . Moderate persistent asthma-  [J45.40] 09/28/2016  . Allergic rhinoconjunctivitis [J30.9, H10.10] 09/28/2016  . Dysphagia [R13.10] 09/28/2016  . Constipation [K59.00] 01/03/2015  . Internal bleeding hemorrhoids [K64.8] 01/03/2015  . Anal bleeding [K62.5] 01/03/2015   Total Time spent with patient: 20 minutes  Past Psychiatric History: Please see H&P.   Past Medical History:  Past Medical History:  Diagnosis Date  . Allergy    SEASONAL  . Anxiety   . Asthma    AS A CHILD  . Depression   . IBS (irritable bowel syndrome)   . Seasonal allergies     Past Surgical History:  Procedure Laterality Date  . BUNIONECTOMY Right   . COLPOSCOPY     Family History:  Family History  Problem Relation Age of Onset  . Anxiety disorder Mother   . Depression Mother   . Allergic rhinitis Father   . Allergic rhinitis Paternal Aunt   . Allergic rhinitis  Paternal Grandmother   . Cancer Paternal Grandmother     stomach  . Heart attack Maternal Grandfather   . Angioedema Neg Hx   . Asthma Neg Hx   . Atopy Neg Hx   . Eczema Neg Hx   . Immunodeficiency Neg Hx   . Urticaria Neg Hx    Family Psychiatric  History: Please see H&P.  Social History: Please see H&P.  History  Alcohol Use No     History  Drug Use No    Social History   Social History  . Marital status: Married    Spouse name: N/A  . Number of children: N/A  . Years of education: N/A   Social History Main Topics  . Smoking status: Never Smoker  . Smokeless tobacco: Never Used  . Alcohol use No  . Drug use: No  . Sexual activity: Yes    Birth control/ protection: Patch   Other Topics Concern  . None   Social History Narrative  . None   Additional Social History:                         Sleep: Fair  Appetite:  improving  Current Medications: Current Facility-Administered Medications  Medication Dose Route Frequency Provider Last Rate Last Dose  . acetaminophen (TYLENOL) tablet 650 mg  650 mg Oral Q6H PRN Nira ConnBerry, Jason A, NP      . albuterol (PROVENTIL HFA;VENTOLIN HFA) 108 (90 Base) MCG/ACT inhaler  2 puff  2 puff Inhalation Q4H PRN Nira Conn A, NP      . alum & mag hydroxide-simeth (MAALOX/MYLANTA) 200-200-20 MG/5ML suspension 30 mL  30 mL Oral Q4H PRN Nira Conn A, NP      . benztropine (COGENTIN) tablet 0.5 mg  0.5 mg Oral BID Jomarie Longs, MD   0.5 mg at 05/04/17 0821  . doxepin (SINEQUAN) capsule 50 mg  50 mg Oral QHS Jomarie Longs, MD   50 mg at 05/03/17 2239  . feeding supplement (ENSURE ENLIVE) (ENSURE ENLIVE) liquid 237 mL  237 mL Oral TID BM Sadie Pickar, MD   237 mL at 05/04/17 1309  . lamoTRIgine (LAMICTAL) tablet 25 mg  25 mg Oral QPM Hyder Deman, MD   25 mg at 05/03/17 1719  . magnesium hydroxide (MILK OF MAGNESIA) suspension 30 mL  30 mL Oral Daily PRN Nira Conn A, NP      . mirtazapine (REMERON) tablet 7.5 mg   7.5 mg Oral QHS Berta Denson, MD   7.5 mg at 05/03/17 2239  . montelukast (SINGULAIR) chewable tablet 10 mg  10 mg Oral QHS Nira Conn A, NP   10 mg at 05/03/17 2239  . pantoprazole (PROTONIX) EC tablet 20 mg  20 mg Oral Daily Adonis Brook, NP   20 mg at 05/04/17 1610  . propranolol (INDERAL) tablet 10 mg  10 mg Oral TID Jomarie Longs, MD   10 mg at 05/04/17 1307  . risperiDONE (RISPERDAL) tablet 0.5 mg  0.5 mg Oral BID Cleotis Nipper, MD   0.5 mg at 05/04/17 9604    Lab Results:  No results found for this or any previous visit (from the past 48 hour(s)).  Blood Alcohol level:  Lab Results  Component Value Date   ETH <5 04/30/2017    Metabolic Disorder Labs: Lab Results  Component Value Date   HGBA1C 5.4 05/02/2017   MPG 108 05/02/2017   Lab Results  Component Value Date   PROLACTIN 9.8 05/02/2017   Lab Results  Component Value Date   CHOL 244 (H) 05/02/2017   TRIG 109 05/02/2017   HDL 61 05/02/2017   CHOLHDL 4.0 05/02/2017   VLDL 22 05/02/2017   LDLCALC 161 (H) 05/02/2017    Physical Findings: AIMS: Facial and Oral Movements Muscles of Facial Expression: None, normal Lips and Perioral Area: None, normal Jaw: None, normal Tongue: None, normal,Extremity Movements Upper (arms, wrists, hands, fingers): None, normal Lower (legs, knees, ankles, toes): None, normal, Trunk Movements Neck, shoulders, hips: None, normal, Overall Severity Severity of abnormal movements (highest score from questions above): None, normal Incapacitation due to abnormal movements: None, normal Patient's awareness of abnormal movements (rate only patient's report): No Awareness, Dental Status Current problems with teeth and/or dentures?: No Does patient usually wear dentures?: No  CIWA:    COWS:     Musculoskeletal: Strength & Muscle Tone: within normal limits Gait & Station: normal Patient leans: N/A  Psychiatric Specialty Exam: Physical Exam  Nursing note and vitals  reviewed.   Review of Systems  Psychiatric/Behavioral: Positive for depression. The patient is nervous/anxious.   All other systems reviewed and are negative.   Blood pressure 100/70, pulse 68, temperature 98.5 F (36.9 C), temperature source Oral, resp. rate 18, height 5\' 2"  (1.575 m), weight 48.5 kg (107 lb).Body mass index is 19.57 kg/m.  General Appearance: Casual  Eye Contact:  Fair  Speech:  Clear and Coherent  Volume:  Normal  Mood:  Anxious and Depressed  Affect:  Congruent  Thought Process:  Goal Directed and Descriptions of Associations: Circumstantial  Orientation:  Full (Time, Place, and Person)  Thought Content:  Delusions, Paranoid Ideation and Rumination  Suicidal Thoughts:  No  Homicidal Thoughts:  No  Memory:  Immediate;   Fair Recent;   Fair Remote;   Fair  Judgement:  Fair  Insight:  Shallow  Psychomotor Activity:  Normal  Concentration:  Concentration: Fair and Attention Span: Fair  Recall:  [employer] of Knowledge:  Fair  Language:  Fair  Akathisia:  No  Handed:  Right  AIMS (if indicated):     Assets:  Communication Skills Desire for Improvement  ADL's:  Intact  Cognition:  WNL  Sleep:  Number of Hours: 6.25    Bipolar disorder, curr episode depressed, severe, w/psychotic features (HCC) unstable - improving  Will continue today 05/04/17 plan as below except where it is noted.     Treatment Plan Summary:Patient with hx of bipolar do, noncompliance with medications prior to admission , as well as hx of anorexia , presents today as less anxious , more calm , less delusional and improving with regards to her PO intake. Will continue to treat.   Daily contact with patient to assess and evaluate symptoms and progress in treatment, Medication management and Plan see below  Will continue Risperidone 0.5 mg po bid for psychosis Will continue Cogentin 0.5 mg po bid for EPS. Will discontinue doxepin for lack of efficacy and increase Remeron to 15 mg  po qhs for insomnia. Continue Lamictal 25 mg po daily for mood sx. Continue Propranolol 10 mg po tid for tachycardia. Encourage PO fluids. Dietician consult placed. Reviewed labs - tsh - wnl, hba1c, pl - wnl. CSW will work on disposition.    Fadel Clason, MD 05/04/2017, 3:09 PM

## 2017-05-04 NOTE — Progress Notes (Signed)
Recreation Therapy Notes   Animal-Assisted Activity (AAA) Program Checklist/Progress Notes Patient Eligibility Criteria Checklist & Daily Group note for Rec Tx Intervention  Date: 05.08.2018 Time: 2:45pm [address]    AAA/T Program Assumption of Risk Form signed by Patient/ or Parent Legal Guardian Yes  Patient is free of allergies or sever asthma Yes  Patient reports no fear of animals Yes  Patient reports no history of cruelty to animals Yes  Patient understands his/her participation is voluntary Yes  Patient washes hands before animal contact Yes  Patient washes hands after animal contact Yes  Behavioral Response: Appropriate    Education: Hand Washing, Appropriate Animal Interaction   Education Outcome: Acknowledges education.   Clinical Observations/Feedback: Patient discussed with MD for appropriateness in pet therapy session. Both LRT and MD agree patient is appropriate for participation. Patient offered participation in session and signed necessary consent form without issue. Patient attended session for approximatley 10 minutes, petting therapy dog appropriately and interacting with peers appropriately during time in group session.   Marykay Lexenise L Syrai Gladwin, LRT/CTRS        Tarance Balan L 05/04/2017 3:00 PM

## 2017-05-04 NOTE — [facility] Group Notes (Signed)
[facility] LCSW Group Therapy  05/04/2017 , 12:58 PM   Type of Therapy:  Group Therapy  Participation Level:  Active  Participation Quality:  Attentive  Affect:  Appropriate  Cognitive:  Alert  Insight:  Improving  Engagement in Therapy:  Engaged  Modes of Intervention:  Discussion, Exploration and Socialization  Summary of Progress/Problems: Today's group focused on the term Diagnosis.  Participants were asked to define the term, and then pronounce whether it is a negative, positive or neutral term. Invited.  Chose to not attend.  Daryel Geraldorth, Mckinsley Koelzer B 05/04/2017 , 12:58 PM

## 2017-05-04 NOTE — [facility] Group Notes (Signed)
The focus of this group is to educate the patient on the purpose and policies of crisis stabilization and provide a format to answer questions about their admission.  The group details unit policies and expectations of patients while admitted.  Patient did not attend 0900 nurse education orientation group this morning.  Patient stayed in room.  

## 2017-05-05 MED ORDER — PROPRANOLOL HCL 20 MG PO TABS
20.0000 mg | ORAL_TABLET | Freq: Two times a day (BID) | ORAL | Status: DC
  Administered 2017-05-05 – 2017-05-09 (×9): 20 mg via ORAL
  Filled 2017-05-05 (×12): qty 1

## 2017-05-05 NOTE — Plan of Care (Signed)
Problem: Medication: Goal: Compliance with prescribed medication regimen will improve Outcome: Not Progressing Pt. needs encouragement with meds. due to paranoia.

## 2017-05-05 NOTE — Progress Notes (Signed)
[facility]  05/05/2017 2:52 PM Shelia Ramos  MRN:  [id number] Subjective: Pt states " I am sad since I miss my family.'   Objective:Patient seen and chart reviewed.Discussed patient with treatment team.  Pt this AM was initially seen as tearful , however pt reported that she was just sad since she missed her family. She reports she is sleeping and her appetite has improved. She was able to take breakfast this AM which is different from past few days , when she was observed as not eating or eating very minimal . Pt per RN continues to have some thought blocking, however is compliant on medications, denies ADRs.   Principal Problem: Bipolar disorder, curr episode depressed, severe, w/psychotic features (HCC) Diagnosis:   Patient Active Problem List   Diagnosis Date Noted  . Bipolar disorder, curr episode depressed, severe, w/psychotic features (HCC) [F31.5] 05/03/2017  . Bunion of great toe of left foot [M21.612] 10/09/2016  . IBS (irritable bowel syndrome) [K58.9] 10/09/2016  . Perimenopause- on Xulane [N95.1] 10/09/2016  . Insomnia disorder [G47.00] 10/09/2016  . Moderate persistent asthma-  [J45.40] 09/28/2016  . Allergic rhinoconjunctivitis [J30.9, H10.10] 09/28/2016  . Dysphagia [R13.10] 09/28/2016  . Constipation [K59.00] 01/03/2015  . Internal bleeding hemorrhoids [K64.8] 01/03/2015  . Anal bleeding [K62.5] 01/03/2015   Total Time spent with patient: 20 minutes  Past Psychiatric History: Please see H&P.   Past Medical History:  Past Medical History:  Diagnosis Date  . Allergy    SEASONAL  . Anxiety   . Asthma    AS A CHILD  . Depression   . IBS (irritable bowel syndrome)   . Seasonal allergies     Past Surgical History:  Procedure Laterality Date  . BUNIONECTOMY Right   . COLPOSCOPY     Family History:  Family History  Problem Relation Age of Onset  . Anxiety disorder Mother   . Depression Mother   . Allergic rhinitis Father   . Allergic  rhinitis Paternal Aunt   . Allergic rhinitis Paternal Grandmother   . Cancer Paternal Grandmother     stomach  . Heart attack Maternal Grandfather   . Angioedema Neg Hx   . Asthma Neg Hx   . Atopy Neg Hx   . Eczema Neg Hx   . Immunodeficiency Neg Hx   . Urticaria Neg Hx    Family Psychiatric  History: Please see H&P.  Social History: Please see H&P.  History  Alcohol Use No     History  Drug Use No    Social History   Social History  . Marital status: Married    Spouse name: N/A  . Number of children: N/A  . Years of education: N/A   Social History Main Topics  . Smoking status: Never Smoker  . Smokeless tobacco: Never Used  . Alcohol use No  . Drug use: No  . Sexual activity: Yes    Birth control/ protection: Patch   Other Topics Concern  . None   Social History Narrative  . None   Additional Social History:                         Sleep: Fair  Appetite:  improving  Current Medications: Current Facility-Administered Medications  Medication Dose Route Frequency Provider Last Rate Last Dose  . acetaminophen (TYLENOL) tablet 650 mg  650 mg Oral Q6H PRN Nira ConnBerry, Jason A, NP      . albuterol (PROVENTIL HFA;VENTOLIN  HFA) 108 (90 Base) MCG/ACT inhaler 2 puff  2 puff Inhalation Q4H PRN Nira Conn A, NP      . alum & mag hydroxide-simeth (MAALOX/MYLANTA) 200-200-20 MG/5ML suspension 30 mL  30 mL Oral Q4H PRN Nira Conn A, NP      . benztropine (COGENTIN) tablet 0.5 mg  0.5 mg Oral BID Jomarie Longs, MD   0.5 mg at 05/05/17 0827  . feeding supplement (ENSURE ENLIVE) (ENSURE ENLIVE) liquid 237 mL  237 mL Oral TID BM Darci Lykins, MD   237 mL at 05/04/17 2114  . lamoTRIgine (LAMICTAL) tablet 25 mg  25 mg Oral QPM Shemiah Rosch, MD   25 mg at 05/04/17 1815  . magnesium hydroxide (MILK OF MAGNESIA) suspension 30 mL  30 mL Oral Daily PRN Nira Conn A, NP      . mirtazapine (REMERON) tablet 15 mg  15 mg Oral QHS Jomarie Longs, MD   15 mg at  05/04/17 2115  . montelukast (SINGULAIR) chewable tablet 10 mg  10 mg Oral QHS Nira Conn A, NP   10 mg at 05/04/17 2201  . pantoprazole (PROTONIX) EC tablet 20 mg  20 mg Oral Daily Adonis Brook, NP   20 mg at 05/05/17 0827  . propranolol (INDERAL) tablet 10 mg  10 mg Oral TID Jomarie Longs, MD   10 mg at 05/05/17 1200  . risperiDONE (RISPERDAL) tablet 0.5 mg  0.5 mg Oral BID Cleotis Nipper, MD   0.5 mg at 05/05/17 0827    Lab Results:  No results found for this or any previous visit (from the past 48 hour(s)).  Blood Alcohol level:  Lab Results  Component Value Date   ETH <5 04/30/2017    Metabolic Disorder Labs: Lab Results  Component Value Date   HGBA1C 5.4 05/02/2017   MPG 108 05/02/2017   Lab Results  Component Value Date   PROLACTIN 9.8 05/02/2017   Lab Results  Component Value Date   CHOL 244 (H) 05/02/2017   TRIG 109 05/02/2017   HDL 61 05/02/2017   CHOLHDL 4.0 05/02/2017   VLDL 22 05/02/2017   LDLCALC 161 (H) 05/02/2017    Physical Findings: AIMS: Facial and Oral Movements Muscles of Facial Expression: None, normal Lips and Perioral Area: None, normal Jaw: None, normal Tongue: None, normal,Extremity Movements Upper (arms, wrists, hands, fingers): None, normal Lower (legs, knees, ankles, toes): None, normal, Trunk Movements Neck, shoulders, hips: None, normal, Overall Severity Severity of abnormal movements (highest score from questions above): None, normal Incapacitation due to abnormal movements: None, normal Patient's awareness of abnormal movements (rate only patient's report): No Awareness, Dental Status Current problems with teeth and/or dentures?: No Does patient usually wear dentures?: No  CIWA:    COWS:     Musculoskeletal: Strength & Muscle Tone: within normal limits Gait & Station: normal Patient leans: N/A  Psychiatric Specialty Exam: Physical Exam  Nursing note and vitals reviewed.   Review of Systems  Psychiatric/Behavioral:  Positive for depression. The patient is nervous/anxious.   All other systems reviewed and are negative.   Blood pressure 113/81, pulse (!) 114, temperature 98.2 F (36.8 C), temperature source Oral, resp. rate 16, height 5\' 2"  (1.575 m), weight 48.5 kg (107 lb).Body mass index is 19.57 kg/m.  General Appearance: Casual  Eye Contact:  Fair  Speech:  Normal Rate  Volume:  Normal  Mood:  Anxious and Depressed  Affect:  Tearful  Thought Process:  Goal Directed and Descriptions of Associations: Circumstantial  Orientation:  Full (Time, Place, and Person)  Thought Content:  Rumination, paranoia is improved , she denies any delusions or ideas of reference today  Suicidal Thoughts:  No  Homicidal Thoughts:  No  Memory:  Immediate;   Fair Recent;   Fair Remote;   Fair  Judgement:  Fair  Insight:  Fair  Psychomotor Activity:  Normal  Concentration:  Concentration: Fair and Attention Span: Fair  Recall:  [employer] of Knowledge:  Fair  Language:  Fair  Akathisia:  No  Handed:  Right  AIMS (if indicated):     Assets:  Communication Skills Desire for Improvement  ADL's:  Intact  Cognition:  WNL  Sleep:  Number of Hours: 6.5    Bipolar disorder, curr episode depressed, severe, w/psychotic features (HCC) improving  Will continue today 05/05/17  plan as below except where it is noted.       Treatment Plan Summary:Patient with hx of bipolar do , anorexia , presents today as tearful , however denies any paranoia or delusions and her appetite is iproving, tolerating medications well. Continue to treat.    Daily contact with patient to assess and evaluate symptoms and progress in treatment, Medication management and Plan see below  Will continue Risperidone 0.5 mg po bid for psychosis Will continue Cogentin 0.5 mg po bid for EPS. Will continue Remeron 15 mg po qhs for insomnia. Continue Lamictal 25 mg po daily for mood sx. Will increase propranolol to 20 mg po bid for  tachycardia. Dietician consult placed.please see notes Reviewed labs - tsh - wnl, hba1c, pl - wnl. CSW will work on disposition.    Shelia Deutschman, MD 05/05/2017, 2:52 PM

## 2017-05-05 NOTE — Progress Notes (Signed)
D: Pt presents with sad affect and depressed mood. Pt tearful during shift assessment. Pt reportedly having crying spells today. Pt reports depression and anxiety. Pt denies SI/AVH/Paranoia. Pt reports improving appetite today. Pt heart rate elevated this morning 131. Inderal given as scheduled.  Dr. Elna BreslowEappen made aware. Heart rate reassessed 114 at noontime. Inderal given as ordered at that time. A: Medications reviewed with pt. Medications administered as ordered per MD. Verbal support provided. Pt encouraged to attend groups. 15 minute checks performed for safety.  R: Pt compliant with tx.

## 2017-05-05 NOTE — Progress Notes (Signed)
NUTRITION NOTE  Pt seen for consult for hx of anorexia with need for education and statement that pt is a picky eater. Per chart review, pt ate lunch and dinner yesterday and he paranoia/hallucinations have been decreasing and sleeping has been increasing. Pt reports that she ate breakfast this AM which consisted of scrambled eggs, sausage, and [place] toast. Pt reports that appetite began decreasing for the 2 weeks PTA. Prior to that time frame she was eating 3 meals and 1-2 snacks/day. She states that over the past 2 weeks intakes have been more sporadic and that even since admission she has been missing some meals d/t psychiatric needs/symptoms.   Pt asks about meal planning and which foods to incorporate during meals. Encouraged protein at all meals, to limit/decrease sugary foods and beverages and high fat items, and to ensure adequate hydration throughout the day (encouraged limiting/decreasing caffeine consumption). Pt drinks soda and is interested in trying sparkling water. She denies any further nutrition-related questions or concerns and is very appreciative of conversation.   No further nutrition needs at this time. Please re-consult if needed.     Trenton GammonJessica Salvator Seppala, MS, RD, LDN, [facility] Inpatient Clinical Dietitian Pager # [phone] After hours/weekend pager # [phone]

## 2017-05-05 NOTE — Progress Notes (Addendum)
D: Pt was at the nurse's station upon initial approach.  Met with pt and her family per request.  Pt has consented for family to receive medical information.  Family expressed concern because they feel she is decompensating.  Pt presents with anxious affect and apprehensive mood.  Pt denies SI/HI, denies hallucinations, denies pain.  Pt exhibits some thought blocking.  She reports her visit with her family was "nice."  Pt reports she ate lunch and dinner today.  Pt has been guarded tonight with minimal interactions with peers.  Pt did not attend evening group.    A: Introduced self to pt.  Actively listened to pt and offered support and encouragement. Medications administered per order.  Q15 minute safety checks maintained.  R: Pt is safe on the unit.  Pt is compliant with medications.  She reports she will inform staff of needs and concerns.  Pt verbally contracts for safety.  Will continue to monitor and assess.

## 2017-05-05 NOTE — [facility] Group Notes (Signed)
[facility] Group Notes:  (Counselor/Nursing/MHT/Case Management/Adjunct)  05/05/2017 1:15PM  Type of Therapy:  Group Therapy  Participation Level:  Active  Participation Quality:  Appropriate  Affect:  Flat  Cognitive:  Oriented  Insight:  Improving  Engagement in Group:  Limited  Engagement in Therapy:  Limited  Modes of Intervention:  Discussion, Exploration and Socialization  Summary of Progress/Problems: The topic for group was balance in life.  Pt participated in the discussion about when their life was in balance and out of balance and how this feels.  Pt discussed ways to get back in balance and short term goals they can work on to get where they want to be. Invited.  Chose to not attend.   Daryel Geraldorth, Tasheka Houseman B 05/05/2017 3:27 PM

## 2017-05-05 NOTE — Progress Notes (Signed)
Recreation Therapy Notes  Date: 05/05/17 Time: 1000 Location: [address] Day[address]  Group Topic: Leisure Education, Goal Setting  Goal Area(s) Addresses:  Patient will be able to identify at least 3 life goals.  Patient will be able to identify benefit of investing in life goals.  Patient will be able to identify benefit of setting life goals.   Intervention: Goals sheet, pencils  Activity: Life Goals.  Patients were to determine what they do well, where they need to improve and set a goal to make that improvement.  Patients had to set goals in the areas of family, friends, work/school, spirituality, body and mental health.  Education:  Discharge Planning, PharmacologistCoping Skills, Leisure Education   Education Outcome: Acknowledges Education/In Group Clarification Provided/Needs Additional Education  Clinical Observations:  Pt did not attend group.   Shelia Ramos, LRT/CTRS      Shelia RancherLindsay, Ileah Falkenstein A 05/05/2017 12:47 PM

## 2017-05-05 NOTE — Plan of Care (Signed)
Problem: Health Behavior/Discharge Planning: Goal: Compliance with prescribed medication regimen will improve Outcome: Progressing Pt was compliant with scheduled medications tonight.    

## 2017-05-06 NOTE — [facility] Group Notes (Signed)
[facility]  05/06/2017 , 12:59 PM    Type of Therapy:  [facility] Presentation  Participation Level:  Active  Participation Quality:  Attentive  Affect:  Blunted  Cognitive:  Oriented  Insight:  Limited  Engagement in Therapy:  Engaged  Modes of Intervention:  Discussion, Education and Socialization  Summary of Progress/Problems:  Onalee HuaDavid from [facility] [employer] came to present his recovery story and play the guitar.  Stayed the entire time, engaged throughout.  Daryel Geraldorth, Dmarion Perfect B 05/06/2017 , 12:59 PM

## 2017-05-06 NOTE — Progress Notes (Addendum)
  DATA ACTION RESPONSE  Objective- Pt. is visible in the room, seen occupying self with crossword puzzles. Presents with a paranoid/anxious affect and mood. Appears to be thought blocking; minimizing s/s. Needs encoragement with taking meds. Subjective- Denies having any SI/HI/AVH/Pain at this time. Pt. states " I am ready to go home to be with Jesus". Continues to be cooperative and remain safe on the unit.  1:1 interaction in private to establish rapport. Encouragement, education, & support given from staff. Meds. ordered and administered.   Safety maintained with Q 15 checks. Continues to follow treatment plan and will monitor closely. No additonal questions/concerns noted.

## 2017-05-06 NOTE — Progress Notes (Signed)
D:Pt is guarded in her room and reports that the voices are "calming." She rates depression and anxiety as an 8. Pt's lt arm has stitches that are dry and intact. Bandage placed to cover incision. A:Offered support, encouragement and 15 minute checks.  R:Pt denies si and hi. Safety maintained on the unit.

## 2017-05-06 NOTE — Progress Notes (Signed)
Recreation Therapy Notes  Date: 05/06/17 Time: 1000 Location: 300 Hall Dayroom  Group Topic: Coping Skills  Goal Area(s) Addresses:  Patient will be able to positive coping skills. Patient will be able to identify benefits of using positive coping skills. Patient will be able to benefits of using coping skills post d/c.  Behavioral Response: Minimal  Intervention: Mindmap, pencils  Activity: Mindmap.  Patients were given a blank mindmap.  Patients and LRT filled in the first eight boxes together.  Patients were to then identify three coping skills for each of the stressors identified by the patients and LRT.  Once patients identified their coping skills, LRT would fill in the coping skills on the board so patients could fill in any blank spaces they may have had on their sheets.  Education: PharmacologistCoping Skills, Building control surveyorDischarge Planning.   Education Outcome: Acknowledges understanding/In group clarification offered/Needs additional education.   Clinical Observations/Feedback: Pt sat quietly and answered when prompted.  Pt stated her coping skill for family was to work it out.   Caroll RancherMarjette Natacha Jepsen, LRT/CTRS         Caroll RancherLindsay, Jancarlos Thrun A 05/06/2017 11:46 AM

## 2017-05-06 NOTE — Progress Notes (Signed)
Patient ID: Shelia Ramos, female   DOB: 08/24/1971, 46 y.o.   MRN: [id number] D: Client in room this shift, but has visitors (husband and daughter). Client reports "voices getting better" "I stopped my Geodon, it wasn't doing what it was suppose to do" Client is watchful, suspicious. Client seen later in the shift reading her bible. A:Writer provided emotional support, encouraged client to report increase or decrease in voices. Medications reviewed, administered as ordered. Staff will monitor q7115min for safety. R: client is safe on the unit, did not attend group.

## 2017-05-06 NOTE — Progress Notes (Signed)
[facility] MD Progress Note  05/06/2017 2:17 PM CELITA ARON  MRN:  [id number]  Subjective: Pt states "I'm sorry, but I'm home sick today"  Objective:Patient seen and chart reviewed.Discussed patient with treatment team.  Pt this AM was initially seen as tearful, however pt reported that she was just sad since she missed her family. She reports she is sleeping a lot better & her appetite has improved. She was able to go to breakfast this AM & lunch this afternoon which is different from past few days. She continues to endorse some level of anxiety being in the hospital. She says her sleep is better. Pt per RN continues to have some thought blocking, however is compliant on medications, denies ADRs. She continue to need support.  Principal Problem: Bipolar disorder, curr episode depressed, severe, w/psychotic features (HCC)  Diagnosis:   Patient Active Problem List   Diagnosis Date Noted  . Bipolar disorder, curr episode depressed, severe, w/psychotic features (HCC) [F31.5] 05/03/2017  . Bunion of great toe of left foot [M21.612] 10/09/2016  . IBS (irritable bowel syndrome) [K58.9] 10/09/2016  . Perimenopause- on Xulane [N95.1] 10/09/2016  . Insomnia disorder [G47.00] 10/09/2016  . Moderate persistent asthma-  [J45.40] 09/28/2016  . Allergic rhinoconjunctivitis [J30.9, H10.10] 09/28/2016  . Dysphagia [R13.10] 09/28/2016  . Constipation [K59.00] 01/03/2015  . Internal bleeding hemorrhoids [K64.8] 01/03/2015  . Anal bleeding [K62.5] 01/03/2015   Total Time spent with patient: 15 minutes  Past Psychiatric History: Please see H&P.  Past Medical History:  Past Medical History:  Diagnosis Date  . Allergy    SEASONAL  . Anxiety   . Asthma    AS A CHILD  . Depression   . IBS (irritable bowel syndrome)   . Seasonal allergies     Past Surgical History:  Procedure Laterality Date  . BUNIONECTOMY Right   . COLPOSCOPY     Family History:  Family History  Problem Relation Age of  Onset  . Anxiety disorder Mother   . Depression Mother   . Allergic rhinitis Father   . Allergic rhinitis Paternal Aunt   . Allergic rhinitis Paternal Grandmother   . Cancer Paternal Grandmother        stomach  . Heart attack Maternal Grandfather   . Angioedema Neg Hx   . Asthma Neg Hx   . Atopy Neg Hx   . Eczema Neg Hx   . Immunodeficiency Neg Hx   . Urticaria Neg Hx    Family Psychiatric  History: Please see H&P.  Social History: Please see H&P.  History  Alcohol Use No     History  Drug Use No    Social History   Social History  . Marital status: Married    Spouse name: N/A  . Number of children: N/A  . Years of education: N/A   Social History Main Topics  . Smoking status: Never Smoker  . Smokeless tobacco: Never Used  . Alcohol use No  . Drug use: No  . Sexual activity: Yes    Birth control/ protection: Patch   Other Topics Concern  . None   Social History Narrative  . None   Sleep: Fair  Appetite:  improving  Current Medications: Current Facility-Administered Medications  Medication Dose Route Frequency Provider Last Rate Last Dose  . acetaminophen (TYLENOL) tablet 650 mg  650 mg Oral Q6H PRN Nira Conn A, NP      . albuterol (PROVENTIL HFA;VENTOLIN HFA) 108 (90 Base) MCG/ACT inhaler 2 puff  2  puff Inhalation Q4H PRN Jackelyn PolingBerry, Jason A, NP      . alum & mag hydroxide-simeth (MAALOX/MYLANTA) 200-200-20 MG/5ML suspension 30 mL  30 mL Oral Q4H PRN Nira ConnBerry, Jason A, NP      . benztropine (COGENTIN) tablet 0.5 mg  0.5 mg Oral BID Jomarie LongsEappen, Saramma, MD   0.5 mg at 05/06/17 0841  . feeding supplement (ENSURE ENLIVE) (ENSURE ENLIVE) liquid 237 mL  237 mL Oral TID BM Eappen, Saramma, MD   237 mL at 05/06/17 0842  . lamoTRIgine (LAMICTAL) tablet 25 mg  25 mg Oral QPM Eappen, Saramma, MD   25 mg at 05/05/17 1709  . magnesium hydroxide (MILK OF MAGNESIA) suspension 30 mL  30 mL Oral Daily PRN Nira ConnBerry, Jason A, NP      . mirtazapine (REMERON) tablet 15 mg  15 mg Oral  QHS Jomarie LongsEappen, Saramma, MD   15 mg at 05/05/17 2109  . montelukast (SINGULAIR) chewable tablet 10 mg  10 mg Oral QHS Nira ConnBerry, Jason A, NP   10 mg at 05/05/17 2109  . pantoprazole (PROTONIX) EC tablet 20 mg  20 mg Oral Daily Adonis BrookAgustin, Sheila, NP   20 mg at 05/06/17 0841  . propranolol (INDERAL) tablet 20 mg  20 mg Oral BID Jomarie LongsEappen, Saramma, MD   20 mg at 05/06/17 0841  . risperiDONE (RISPERDAL) tablet 0.5 mg  0.5 mg Oral BID Cleotis NipperArfeen, Syed T, MD   0.5 mg at 05/06/17 [id number]    Lab Results:  No results found for this or any previous visit (from the past 48 hour(s)).  Blood Alcohol level:  Lab Results  Component Value Date   ETH <5 04/30/2017    Metabolic Disorder Labs: Lab Results  Component Value Date   HGBA1C 5.4 05/02/2017   MPG 108 05/02/2017   Lab Results  Component Value Date   PROLACTIN 9.8 05/02/2017   Lab Results  Component Value Date   CHOL 244 (H) 05/02/2017   TRIG 109 05/02/2017   HDL 61 05/02/2017   CHOLHDL 4.0 05/02/2017   VLDL 22 05/02/2017   LDLCALC 161 (H) 05/02/2017    Physical Findings: AIMS: Facial and Oral Movements Muscles of Facial Expression: None, normal Lips and Perioral Area: None, normal Jaw: None, normal Tongue: None, normal,Extremity Movements Upper (arms, wrists, hands, fingers): None, normal Lower (legs, knees, ankles, toes): None, normal, Trunk Movements Neck, shoulders, hips: None, normal, Overall Severity Severity of abnormal movements (highest score from questions above): None, normal Incapacitation due to abnormal movements: None, normal Patient's awareness of abnormal movements (rate only patient's report): No Awareness, Dental Status Current problems with teeth and/or dentures?: No Does patient usually wear dentures?: No  CIWA:    COWS:     Musculoskeletal: Strength & Muscle Tone: within normal limits Gait & Station: normal Patient leans: N/A  Psychiatric Specialty Exam: Physical Exam  Nursing note and vitals reviewed.   Review of  Systems  Psychiatric/Behavioral: Positive for depression. The patient is nervous/anxious.   All other systems reviewed and are negative.   Blood pressure 120/69, pulse (!) 112, temperature 98.2 F (36.8 C), temperature source Oral, resp. rate 16, height 5\' 2"  (1.575 m), weight 48.5 kg (107 lb).Body mass index is 19.57 kg/m.  General Appearance: Casual  Eye Contact:  Fair  Speech:  Normal Rate  Volume:  Normal  Mood:  Anxious and Depressed  Affect:  Tearful  Thought Process:  Goal Directed and Descriptions of Associations: Circumstantial  Orientation:  Full (Time, Place, and Person)  Thought Content:  Rumination, paranoia is improved , she denies any delusions or ideas of reference today  Suicidal Thoughts:  No  Homicidal Thoughts:  No  Memory:  Immediate;   Fair Recent;   Fair Remote;   Fair  Judgement:  Fair  Insight:  Fair  Psychomotor Activity:  Normal  Concentration:  Concentration: Fair and Attention Span: Fair  Recall:  [employer] of Knowledge:  Fair  Language:  Fair  Akathisia:  No  Handed:  Right  AIMS (if indicated):     Assets:  Communication Skills Desire for Improvement  ADL's:  Intact  Cognition:  WNL  Sleep:  Number of Hours: 4.5   Bipolar disorder, curr episode depressed, severe, w/psychotic features (HCC) improving  Will continue today 05/06/17  plan as below except where it is noted.  Treatment Plan Summary:Patient with hx of bipolar do , anorexia , presents today as tearful , however denies any paranoia or delusions and her appetite is iproving, tolerating medications well. Continue to treat.  Daily contact with patient to assess and evaluate symptoms and progress in treatment, Medication management and Plan see below  Will continue Risperidone 0.5 mg po bid for psychosis Will continue Cogentin 0.5 mg po bid for EPS. Will continue Remeron 15 mg po qhs for insomnia. Continue Lamictal 25 mg po daily for mood sx. Will continue propranolol 20 mg po bid  for tachycardia. Dietician consult placed.please see notes Reviewed labs - tsh - wnl, hba1c, pl - wnl. CSW will work on disposition.  Sanjuana Kava, NP, PMHNP, FNP-BC 05/06/2017, 2:17 PMPatient ID: Darrel Hoover, female   DOB: April 21, 1971, 46 y.o.   MRN: [id number]

## 2017-05-07 MED ORDER — HYDROXYZINE HCL 25 MG PO TABS
25.0000 mg | ORAL_TABLET | Freq: Once | ORAL | Status: DC
  Filled 2017-05-07 (×2): qty 1

## 2017-05-07 MED ORDER — RISPERIDONE 2 MG PO TABS
2.0000 mg | ORAL_TABLET | Freq: Every day | ORAL | Status: DC
  Administered 2017-05-07: 2 mg via ORAL
  Filled 2017-05-07 (×3): qty 1

## 2017-05-07 NOTE — Progress Notes (Addendum)
  [facility] :  Will you be returning to the same living situation after discharge:  Yes,  home with husband At discharge, do you have transportation home?: Yes,  husband-pt may be discharged on Saturday if stable per MD. Do you have the ability to pay for your medications: Yes,  [facility] insurance  Release of information consent forms completed and submitted to medical records by CSW>  Patient to Follow up at: Follow-up Information    [facility] Follow up on 05/11/2017.   Why:  Appt on Tuesday with Claudia at 5:00PM for therapy. You will be scheduled for medication management at this appt. Contact information: [address] [place], [place] [address] Phone: [phone] Fax: [phone]          Next level of care provider [facility] Link:no  Safety Planning and Suicide Prevention discussed: Yes,  SPE completed with pt's husband. SPI pamphlet and Mobile Crisis information also provided to pt.  Have you used any form of tobacco in the last 30 days? (Cigarettes, Smokeless Tobacco, Cigars, and/or Pipes): No  Has patient been referred to the Quitline?: N/A patient is not a smoker  Patient has been referred for addiction treatment: Yes  Ledell PeoplesHeather N Smart LCSW 05/07/2017, 8:31 AM

## 2017-05-07 NOTE — Progress Notes (Signed)
[facility] MD Progress Note  05/07/2017 4:01 PM Shelia Ramos  MRN:  [id number]  Subjective: Pt states "I'm feel a little better. I think it is because I read the bible. I still hear the voices & I was restless last night"  Objective:Patient seen and chart reviewed.Discussed patient with treatment team.  Pt this AM was initially seen as quiet. Keeps to her self. She says she was restless last night. She spends a lot of time in her room. Presents today tearful & emotional. She continues to endorse some level of anxiety & continue to report hearing voices. She seem suspicious of other people as she likes to spend a lot of time alone in her room. She says her sleep was a little restless last night. Pt per RN continues to have some thought blocking, however is compliant on medications, denies ADRs. She continue to need support.  Principal Problem: Bipolar disorder, curr episode depressed, severe, w/psychotic features (HCC)  Diagnosis:   Patient Active Problem List   Diagnosis Date Noted  . Bipolar disorder, curr episode depressed, severe, w/psychotic features (HCC) [F31.5] 05/03/2017  . Bunion of great toe of left foot [M21.612] 10/09/2016  . IBS (irritable bowel syndrome) [K58.9] 10/09/2016  . Perimenopause- on Xulane [N95.1] 10/09/2016  . Insomnia disorder [G47.00] 10/09/2016  . Moderate persistent asthma-  [J45.40] 09/28/2016  . Allergic rhinoconjunctivitis [J30.9, H10.10] 09/28/2016  . Dysphagia [R13.10] 09/28/2016  . Constipation [K59.00] 01/03/2015  . Internal bleeding hemorrhoids [K64.8] 01/03/2015  . Anal bleeding [K62.5] 01/03/2015   Total Time spent with patient: 15 minutes  Past Psychiatric History: Please see H&P.  Past Medical History:  Past Medical History:  Diagnosis Date  . Allergy    SEASONAL  . Anxiety   . Asthma    AS A CHILD  . Depression   . IBS (irritable bowel syndrome)   . Seasonal allergies     Past Surgical History:  Procedure Laterality Date  .  BUNIONECTOMY Right   . COLPOSCOPY     Family History:  Family History  Problem Relation Age of Onset  . Anxiety disorder Mother   . Depression Mother   . Allergic rhinitis Father   . Allergic rhinitis Paternal Aunt   . Allergic rhinitis Paternal Grandmother   . Cancer Paternal Grandmother        stomach  . Heart attack Maternal Grandfather   . Angioedema Neg Hx   . Asthma Neg Hx   . Atopy Neg Hx   . Eczema Neg Hx   . Immunodeficiency Neg Hx   . Urticaria Neg Hx    Family Psychiatric  History: Please see H&P.  Social History: Please see H&P.  History  Alcohol Use No     History  Drug Use No    Social History   Social History  . Marital status: Married    Spouse name: N/A  . Number of children: N/A  . Years of education: N/A   Social History Main Topics  . Smoking status: Never Smoker  . Smokeless tobacco: Never Used  . Alcohol use No  . Drug use: No  . Sexual activity: Yes    Birth control/ protection: Patch   Other Topics Concern  . None   Social History Narrative  . None   Sleep: Fair  Appetite:  improving  Current Medications: Current Facility-Administered Medications  Medication Dose Route Frequency Provider Last Rate Last Dose  . acetaminophen (TYLENOL) tablet 650 mg  650 mg Oral Q6H PRN Allyson Sabal,  Jason A, NP      . albuterol (PROVENTIL HFA;VENTOLIN HFA) 108 (90 Base) MCG/ACT inhaler 2 puff  2 puff Inhalation Q4H PRN Nira Conn A, NP      . alum & mag hydroxide-simeth (MAALOX/MYLANTA) 200-200-20 MG/5ML suspension 30 mL  30 mL Oral Q4H PRN Nira Conn A, NP      . benztropine (COGENTIN) tablet 0.5 mg  0.5 mg Oral BID Eappen, Saramma, MD   0.5 mg at 05/07/17 0930  . feeding supplement (ENSURE ENLIVE) (ENSURE ENLIVE) liquid 237 mL  237 mL Oral TID BM Eappen, Saramma, MD   237 mL at 05/06/17 2044  . hydrOXYzine (ATARAX/VISTARIL) tablet 25 mg  25 mg Oral Once Nira Conn A, NP      . lamoTRIgine (LAMICTAL) tablet 25 mg  25 mg Oral QPM Eappen, Saramma,  MD   25 mg at 05/06/17 1715  . magnesium hydroxide (MILK OF MAGNESIA) suspension 30 mL  30 mL Oral Daily PRN Nira Conn A, NP      . mirtazapine (REMERON) tablet 15 mg  15 mg Oral QHS Jomarie Longs, MD   15 mg at 05/06/17 2156  . montelukast (SINGULAIR) chewable tablet 10 mg  10 mg Oral QHS Nira Conn A, NP   10 mg at 05/06/17 2156  . pantoprazole (PROTONIX) EC tablet 20 mg  20 mg Oral Daily Adonis Brook, NP   20 mg at 05/07/17 0931  . propranolol (INDERAL) tablet 20 mg  20 mg Oral BID Jomarie Longs, MD   20 mg at 05/07/17 0931  . risperiDONE (RISPERDAL) tablet 2 mg  2 mg Oral QHS Izediuno, Delight Ovens, MD        Lab Results:  No results found for this or any previous visit (from the past 48 hour(s)).  Blood Alcohol level:  Lab Results  Component Value Date   ETH <5 04/30/2017    Metabolic Disorder Labs: Lab Results  Component Value Date   HGBA1C 5.4 05/02/2017   MPG 108 05/02/2017   Lab Results  Component Value Date   PROLACTIN 9.8 05/02/2017   Lab Results  Component Value Date   CHOL 244 (H) 05/02/2017   TRIG 109 05/02/2017   HDL 61 05/02/2017   CHOLHDL 4.0 05/02/2017   VLDL 22 05/02/2017   LDLCALC 161 (H) 05/02/2017   Physical Findings: AIMS: Facial and Oral Movements Muscles of Facial Expression: None, normal Lips and Perioral Area: None, normal Jaw: None, normal Tongue: None, normal,Extremity Movements Upper (arms, wrists, hands, fingers): None, normal Lower (legs, knees, ankles, toes): None, normal, Trunk Movements Neck, shoulders, hips: None, normal, Overall Severity Severity of abnormal movements (highest score from questions above): None, normal Incapacitation due to abnormal movements: None, normal Patient's awareness of abnormal movements (rate only patient's report): No Awareness, Dental Status Current problems with teeth and/or dentures?: No Does patient usually wear dentures?: No  CIWA:    COWS:     Musculoskeletal: Strength & Muscle Tone:  within normal limits Gait & Station: normal Patient leans: N/A  Psychiatric Specialty Exam: Physical Exam  Nursing note and vitals reviewed.   Review of Systems  Psychiatric/Behavioral: Positive for depression and hallucinations (Positive for hallucinations.). Negative for memory loss, substance abuse and suicidal ideas. The patient is nervous/anxious and has insomnia.   All other systems reviewed and are negative.   Blood pressure 116/77, pulse 100, temperature 99 F (37.2 C), temperature source Oral, resp. rate 16, height 5\' 2"  (1.575 m), weight 48.5 kg (107 lb).Body mass  index is 19.57 kg/m.  General Appearance: Casual, suspicious  Eye Contact:  Fair  Speech:  Normal Rate  Volume:  Normal  Mood:  Anxious and Depressed  Affect:  Tearful  Thought Process:  Goal Directed and Descriptions of Associations: Circumstantial  Orientation:  Full (Time, Place, and Person)  Thought Content:  Rumination, paranoia is improved , she denies any delusions or ideas of reference today  Suicidal Thoughts:  No  Homicidal Thoughts:  No  Memory:  Immediate;   Fair Recent;   Fair Remote;   Fair  Judgement:  Fair  Insight:  Fair  Psychomotor Activity:  Normal  Concentration:  Concentration: Fair and Attention Span: Fair  Recall:  [employer]Fair  Fund of Knowledge:  Fair  Language:  Fair  Akathisia:  No  Handed:  Right  AIMS (if indicated):     Assets:  Communication Skills Desire for Improvement  ADL's:  Intact  Cognition:  WNL  Sleep:  Number of Hours: 3.25   Bipolar disorder, curr episode depressed, severe, w/psychotic features (HCC) improving  Will continue today 05/07/17  plan as below except where it is noted.  Treatment Plan Summary:Patient with hx of bipolar do , anorexia , presents today as tearful , however denies any paranoia or delusions and her appetite is iproving, tolerating medications well. Continue to treat.  Daily contact with patient to assess and evaluate symptoms and progress  in treatment, Medication management and Plan see below  Will continue Risperidone 0.5 mg po bid for psychosis Will continue Cogentin 0.5 mg po bid for EPS. Will continue Remeron 15 mg po qhs for insomnia. Continue Lamictal 25 mg po daily for mood sx. Will continue propranolol 20 mg po bid for tachycardia. Dietician consult placed.please see notes Reviewed labs - tsh - wnl, hba1c, pl - wnl. CSW will work on disposition.  Sanjuana KavaNwoko, Agnes I, NP, PMHNP, FNP-BC 05/07/2017, 4:01 PMPatient ID: Darrel HooverKimberly D Hart, female   DOB: 27-Sep-1971, 46 y.o.   MRN: [id number]

## 2017-05-07 NOTE — Progress Notes (Signed)
Recreation Therapy Notes  Date: 05/07/17 Time: 1000 Location: 300 Hall Dayroom  Group Topic: Stress Management  Goal Area(s) Addresses:  Patient will verbalize importance of using healthy stress management.  Patient will identify positive emotions associated with healthy stress management.   Intervention: Stress Management  Activity :  Peaceful Waves, Music.  LRT introduced the stress management techniques of guided imagery and music.  Patients were to listen and follow along as LRT read script to participate in guided imagery.  Patients also listened to music, socialized and moved along with the music.  Education:  Stress Management, Discharge Planning.   Education Outcome: Acknowledges edcuation/In group clarification offered/Needs additional education  Clinical Observations/Feedback: Pt did not attend group.   Caroll RancherMarjette Diarra Ceja, LRT/CTRS         Caroll RancherLindsay, Haizlee Henton A 05/07/2017 11:34 AM

## 2017-05-07 NOTE — Tx Team (Signed)
Interdisciplinary Treatment and Diagnostic Plan Update  05/07/2017 Time of Session: 0930 Shelia Ramos MRN: [id number]  Principal Diagnosis: Bipolar disorder, curr episode depressed, severe, w/psychotic features [facility])  Secondary Diagnoses: Principal Problem:   Bipolar disorder, curr episode depressed, severe, w/psychotic features ([place])   Current Medications:  Current Facility-Administered Medications  Medication Dose Route Frequency Provider Last Rate Last Dose  . acetaminophen (TYLENOL) tablet 650 mg  650 mg Oral Q6H PRN Lindon Romp A, NP      . albuterol (PROVENTIL HFA;VENTOLIN HFA) 108 (90 Base) MCG/ACT inhaler 2 puff  2 puff Inhalation Q4H PRN Lindon Romp A, NP      . alum & mag hydroxide-simeth (MAALOX/MYLANTA) 200-200-20 MG/5ML suspension 30 mL  30 mL Oral Q4H PRN Lindon Romp A, NP      . benztropine (COGENTIN) tablet 0.5 mg  0.5 mg Oral BID Ursula Alert, MD   0.5 mg at 05/06/17 1715  . feeding supplement (ENSURE ENLIVE) (ENSURE ENLIVE) liquid 237 mL  237 mL Oral TID BM Eappen, Saramma, MD   237 mL at 05/06/17 2044  . hydrOXYzine (ATARAX/VISTARIL) tablet 25 mg  25 mg Oral Once Lindon Romp A, NP      . lamoTRIgine (LAMICTAL) tablet 25 mg  25 mg Oral QPM Eappen, Saramma, MD   25 mg at 05/06/17 1715  . magnesium hydroxide (MILK OF MAGNESIA) suspension 30 mL  30 mL Oral Daily PRN Lindon Romp A, NP      . mirtazapine (REMERON) tablet 15 mg  15 mg Oral QHS Ursula Alert, MD   15 mg at 05/06/17 2156  . montelukast (SINGULAIR) chewable tablet 10 mg  10 mg Oral QHS Lindon Romp A, NP   10 mg at 05/06/17 2156  . pantoprazole (PROTONIX) EC tablet 20 mg  20 mg Oral Daily Kerrie Buffalo, NP   20 mg at 05/06/17 0841  . propranolol (INDERAL) tablet 20 mg  20 mg Oral BID Ursula Alert, MD   20 mg at 05/06/17 1715  . risperiDONE (RISPERDAL) tablet 0.5 mg  0.5 mg Oral BID Arfeen, Arlyce Harman, MD   0.5 mg at 05/06/17 1715   PTA Medications: Facility-Administered Medications Prior to  Admission  Medication Dose Route Frequency Provider Last Rate Last Dose  . [DISCONTINUED] triamcinolone acetonide (KENALOG-40) injection 20 mg  20 mg Other Once Landis Martins, DPM       Prescriptions Prior to Admission  Medication Sig Dispense Refill Last Dose  . albuterol (PROAIR HFA) 108 (90 Base) MCG/ACT inhaler Inhale 2 puffs into the lungs every 4 (four) hours as needed for wheezing or shortness of breath. 1 Inhaler 1 unk  . montelukast (SINGULAIR) 5 MG chewable tablet Take two tablets each evening. 60 tablet 5 Past Week at Unknown time  . [DISCONTINUED] beclomethasone (QVAR) 80 MCG/ACT inhaler Inhale 2 puffs into the lungs 2 (two) times daily. (Patient not taking: Reported on 04/30/2017) 1 Inhaler 5 Not Taking at Unknown time  . [DISCONTINUED] clonazePAM (KLONOPIN) 0.5 MG tablet Take 0.5 mg by mouth daily as needed for anxiety.   2 unk  . [DISCONTINUED] doxepin (SINEQUAN) 75 MG capsule Take 150 mg by mouth at bedtime.  1 04/29/2017 at Unknown time  . [DISCONTINUED] fluticasone (FLONASE) 50 MCG/ACT nasal spray Use 1-2 sprays each nostril each morning. (Patient not taking: Reported on 04/30/2017) 1 g 5 Not Taking at Unknown time  . [DISCONTINUED] hyoscyamine (LEVSIN SL) 0.125 MG SL tablet PLACE 1 TABLET UNDER TONGUE EVERY 6 HOURS AS NEEDED 60 tablet  3 04/29/2017 at Unknown time  . [DISCONTINUED] Inulin (FIBER CHOICE FRUITY BITES) 1.5 g CHEW Chew 2 Units by mouth daily.   Past Month at Unknown time  . [DISCONTINUED] NONFORMULARY OR COMPOUNDED ITEM [facility]: Antiinflammatory Cream - Diclofenac 3%, Baclofen 2%, Lidocaine 2%, apply 1-2 grams to affected area 3-4 times daily. (Patient not taking: Reported on 04/30/2017) 480 each 11 Not Taking at Unknown time  . [DISCONTINUED] Probiotic Product (PROBIOTIC DAILY PO) Take 1 tablet by mouth daily.   Past Month at Unknown time  . [DISCONTINUED] ranitidine (ZANTAC) 150 MG tablet Take 1 tablet (150 mg total) by mouth at bedtime. (Patient not taking:  Reported on 04/30/2017) 30 tablet 3 Not Taking at Unknown time  . [DISCONTINUED] Spacer/Aero-Holding Chambers (AEROCHAMBER PLUS FLO-VU LARGE) MISC Use with MDI as directed. 1 each 1 Taking  . [DISCONTINUED] ziprasidone (GEODON) 40 MG capsule Take 40 mg by mouth daily.    04/30/2017 at 1830    Patient Stressors: Medication change or noncompliance Traumatic event  Patient Strengths: Average or above average intelligence Supportive family/friends  Treatment Modalities: Medication Management, Group therapy, Case management,  1 to 1 session with clinician, Psychoeducation, Recreational therapy.   Physician Treatment Plan for Primary Diagnosis: Bipolar disorder, curr episode depressed, severe, w/psychotic features ([place]) Long Term Goal(s): Improvement in symptoms so as ready for discharge Improvement in symptoms so as ready for discharge   Short Term Goals: Ability to identify changes in lifestyle to reduce recurrence of condition will improve Ability to disclose and discuss suicidal ideas Ability to demonstrate self-control will improve Ability to identify and develop effective coping behaviors will improve Ability to verbalize feelings will improve Ability to disclose and discuss suicidal ideas Ability to demonstrate self-control will improve Ability to identify and develop effective coping behaviors will improve Ability to maintain clinical measurements within normal limits will improve Compliance with prescribed medications will improve  Medication Management: Evaluate patient's response, side effects, and tolerance of medication regimen.  Therapeutic Interventions: 1 to 1 sessions, Unit Group sessions and Medication administration.  Evaluation of Outcomes: Met  Physician Treatment Plan for Secondary Diagnosis: Principal Problem:   Bipolar disorder, curr episode depressed, severe, w/psychotic features ([place])  Long Term Goal(s): Improvement in symptoms so as ready for  discharge Improvement in symptoms so as ready for discharge   Short Term Goals: Ability to identify changes in lifestyle to reduce recurrence of condition will improve Ability to disclose and discuss suicidal ideas Ability to demonstrate self-control will improve Ability to identify and develop effective coping behaviors will improve Ability to verbalize feelings will improve Ability to disclose and discuss suicidal ideas Ability to demonstrate self-control will improve Ability to identify and develop effective coping behaviors will improve Ability to maintain clinical measurements within normal limits will improve Compliance with prescribed medications will improve     Medication Management: Evaluate patient's response, side effects, and tolerance of medication regimen.  Therapeutic Interventions: 1 to 1 sessions, Unit Group sessions and Medication administration.  Evaluation of Outcomes: Met  RN Treatment Plan for Primary Diagnosis: Bipolar disorder, curr episode depressed, severe, w/psychotic features ([place]) Long Term Goal(s): Knowledge of disease and therapeutic regimen to maintain health will improve  Short Term Goals: Ability to remain free from injury will improve, Ability to verbalize feelings will improve and Ability to disclose and discuss suicidal ideas  Medication Management: RN will administer medications as ordered by provider, will assess and evaluate patient's response and provide education to patient for prescribed medication. RN will  report any adverse and/or side effects to prescribing provider.  Therapeutic Interventions: 1 on 1 counseling sessions, Psychoeducation, Medication administration, Evaluate responses to treatment, Monitor vital signs and CBGs as ordered, Perform/monitor CIWA, COWS, AIMS and Fall Risk screenings as ordered, Perform wound care treatments as ordered.  Evaluation of Outcomes: Met  LCSW Treatment Plan for Primary Diagnosis: Bipolar disorder,  curr episode depressed, severe, w/psychotic features ([place]) Long Term Goal(s): Safe transition to appropriate next level of care at discharge, Engage patient in therapeutic group addressing interpersonal concerns.  Short Term Goals: Engage patient in aftercare planning with referrals and resources, Facilitate patient progression through stages of change regarding substance use diagnoses and concerns and Identify triggers associated with mental health/substance abuse issues  Therapeutic Interventions: Assess for all discharge needs, 1 to 1 time with Social worker, Explore available resources and support systems, Assess for adequacy in community support network, Educate family and significant other(s) on suicide prevention, Complete Psychosocial Assessment, Interpersonal group therapy.  Evaluation of Outcomes: Met/adequate for discharge   Progress in Treatment: Attending groups: Intermittently   Participating in groups: Yes, when she attends  Taking medication as prescribed: Yes. Toleration medication: Yes. Family/Significant other contact made: SPE completed with pt's husband. Collateral information also obtained.  Patient understands diagnosis: Yes. Discussing patient identified problems/goals with staff: Yes. Medical problems stabilized or resolved: Yes Denies suicidal/homicidal ideation: Yes Issues/concerns per patient self-inventory: No. Other: n/a   New problem(s) identified: No, Describe:  n/a   New Short Term/Long Term Goal(s): elimination of SI thoughts; decrease in depressive symptoms and paranoia, development of comprehensive mental wellness plan.   Discharge Plan or Barriers: Pt plan to return home today with her husband; follow-up made with pt's current provider, [facility] for 05/11/17.   Reason for Continuation of Hospitalization: medication management; depression  Estimated Length of Stay: discharge Saturday if stable per MD.   Attendees: Patient: 05/07/2017  8:32 AM  Physician: Dr. Sanjuana Letters MD 05/07/2017 8:32 AM  Nursing: Juanna Cao RN 05/07/2017 8:32 AM  [place] 05/07/2017 8:32 AM  Social Worker: Maxie Better, [place]; Roque Lias LCSW 05/07/2017 8:32 AM  Recreational Therapist: Rhunette Croft 05/07/2017 8:32 AM  Other: Lindell Spar NP  05/07/2017 8:32 AM  Other:  05/07/2017 8:32 AM  Other: 05/07/2017 8:32 AM    Scribe for Treatment Team: [place], LCSW 05/07/2017 8:32 AM

## 2017-05-07 NOTE — [facility] Group Notes (Signed)
[facility] LCSW Group Therapy  05/07/2017  1:05 PM  Type of Therapy:  Group therapy  Participation Level:  Active  Participation Quality:  Attentive  Affect:  Flat  Cognitive:  Oriented  Insight:  Limited  Engagement in Therapy:  Limited  Modes of Intervention:  Discussion, Socialization  Summary of Progress/Problems:  Chaplain was here to lead a group on themes of hope and courage. "Courage comes from family, it comes from friends, it comes from my faith. I have been reading my Bible that my daughter brought in for me."  Ida Rogueorth, Refugio Vandevoorde B 05/07/2017 1:31 PM

## 2017-05-07 NOTE — Plan of Care (Signed)
Problem: Safety: Goal: Ability to remain free from injury will improve Outcome: Progressing Ability to remain free from injury will improve AEB q2915min safety checks. Client remains safe on the unit.

## 2017-05-07 NOTE — Progress Notes (Signed)
Nursing Note 05/07/2017 [id number]  Data Reports sleeping well.  Did not complete self-inventory sheet.  Denies HI, SI, AVH.  Guarded and isolative, patient wanted meds brought to her this AM because she felt tired and depressed.  RN had patient come to window.  Spends a lot of time in room reading, seen with bible and other books.  Did not attend groups.  Isolative.   Action Spoke with patient 1:1, nurse offered support to patient throughout shift.  Continues to be monitored on 15 minute checks for safety.  Response Remains safe on unit, though isolative and guarded.

## 2017-05-07 NOTE — Progress Notes (Signed)
Adult Psychoeducational Group Note  Date:  05/07/2017 Time:  3:27 AM  Group Topic/Focus:  Wrap-Up Group:   The focus of this group is to help patients review their daily goal of treatment and discuss progress on daily workbooks.  Participation Level:  Did Not Attend  Additional Comments: Pt did not attend group, pt remained in her room. Pt was encouraged to attend group.  Karleen HampshireFox, Sherline Eberwein Brittini 05/07/2017, 3:27 AM

## 2017-05-07 NOTE — Progress Notes (Signed)
D.  Pleasant on approach, in room in bed.  Minimal interaction.  Pt did take medications as ordered without issue.  Pt denies SI/HI but continues to endorse auditory hallucinations. A.  Support and encouragement offered, medication given as ordered  R.  Pt remains safe on the unit, will continue to monitor.

## 2017-05-08 DIAGNOSIS — S41119A Laceration without foreign body of unspecified upper arm, initial encounter: Secondary | ICD-10-CM

## 2017-05-08 DIAGNOSIS — G4700 Insomnia, unspecified: Secondary | ICD-10-CM

## 2017-05-08 DIAGNOSIS — Z818 Family history of other mental and behavioral disorders: Secondary | ICD-10-CM

## 2017-05-08 DIAGNOSIS — F315 Bipolar disorder, current episode depressed, severe, with psychotic features: Principal | ICD-10-CM

## 2017-05-08 DIAGNOSIS — T1491XA Suicide attempt, initial encounter: Secondary | ICD-10-CM

## 2017-05-08 DIAGNOSIS — X781XXA Intentional self-harm by knife, initial encounter: Secondary | ICD-10-CM

## 2017-05-08 MED ORDER — RISPERIDONE 3 MG PO TABS
3.0000 mg | ORAL_TABLET | Freq: Every day | ORAL | Status: DC
  Administered 2017-05-08 – 2017-05-09 (×2): 3 mg via ORAL
  Filled 2017-05-08: qty 3
  Filled 2017-05-08 (×2): qty 1
  Filled 2017-05-08: qty 7
  Filled 2017-05-08: qty 1

## 2017-05-08 NOTE — Progress Notes (Signed)
Adult Psychoeducational Group Note  Date:  05/08/2017 Time:  4:26 AM  Group Topic/Focus:  Wrap-Up Group:   The focus of this group is to help patients review their daily goal of treatment and discuss progress on daily workbooks.  Participation Level:  Did Not Attend  Participation Quality:  Patient did not attend  Affect:  Patient did not attend  Cognitive:  Patient did not attend  Insight: None   Engagement in Group:  Patient did not attend  Modes of Intervention:  Patient did not attend  Additional Comments:  Patient did not attend wrap up group this evening.  Shelia FurnaceChristopher  Zylee Ramos 05/08/2017, 4:26 AM

## 2017-05-08 NOTE — Progress Notes (Signed)
DAR NOTE: Patient presents with flat affect and depressed mood.  Denies pain, auditory and visual hallucinations.  Described energy level as low and concentration as good.  Rates depression at 5, hopelessness at 10, and anxiety at 6.  Maintained on routine safety checks.  Medications given as prescribed.  Support and encouragement offered as needed.  Attended group and participated.  States goal for today is "to develop discharge plan."  Minimal interaction with peers and staff.  Patient observed socializing with peers in the dayroom.  Patient remained withdrawn and isolative to her room.  Stitches to left forearm intact.  Patient in her room most of this shift reading her Bible.  Reports the Bible helps her to cope and focus.  Daughter report to social worker that mother believed that husband has fathered another child.  Patient is safe on the unit.

## 2017-05-08 NOTE — [facility] Counselor (Signed)
Writer returned Daughter Chauncey Fischermber Barritt at [phone] and reports mother had seemed better on 5/11 yet today is focused on belief husband has another child in the world which daughter and husband say are untrue.   Daughter states belief that pt needs to be on Geodon which worked well for her in the past.   Carney Bernatherine C Harrill, LCSW

## 2017-05-08 NOTE — [facility] Group Notes (Signed)
[facility] LCSW Group Therapy Note  05/08/2017  and  10:00 AM  Type of Therapy and Topic:  Group Therapy: Avoiding Self-Sabotaging and Enabling Behaviors  Participation Level:  Minimal  Participation Quality:  Attentive and Sharing  Affect:  Hesitant  Cognitive:  Alert and Oriented  Insight:  None shared  Engagement in Therapy:  None noted   Therapeutic models used: Cognitive Behavioral Therapy,  Person-Centered Therapy and Motivational Interviewing  Modes of Intervention:  Discussion, Exploration, Orientation, Rapport Building, Socialization and Support   Summary of Progress/Problems:  The main focus of today's process group was for the patient to identify ways in which they have in the past sabotaged their own recovery. Motivational Interviewing was utilized to identify motivation they may have for wanting to change. Patient shared only during warm up yet was attentive throughout group   Carney Bernatherine C Jarryd Gratz, LCSW

## 2017-05-08 NOTE — Progress Notes (Signed)
Psychoeducational Group Note  Date:  05/08/2017 Time: 2030  Group Topic/Focus:  wrap up group  Participation Level: Did Not Attend  Participation Quality:  Not Applicable  Affect:  Not Applicable  Cognitive:  Not Applicable  Insight:  Not Applicable  Engagement in Group: Not Applicable  Additional Comments:  Pt was notified that group was beginning but remained in bed.   Marcille BuffyMcNeil, Shelia Ramos 05/08/2017, 10:34 PM

## 2017-05-08 NOTE — Progress Notes (Signed)
[facility] MD Progress Note  05/08/2017 4:12 PM Shelia Ramos  MRN:  [id number] Subjective:   Patient is 46 year old Caucasian, employed female who was admitted due to having paranoia, hallucination, poor sleep and having thoughts to take her own life.  Patient cut her arm with a knife which required stitches.  She admitted it was a suicidal attempt because she does not want to live anymore.  Patient also reported that she feels people are staring at her and out to get her.  She endorse that she's been hearing voices mostly music. Has not been adherent with Geodon for over a month.   Chart reviewed today. Patient discussed at team.   Staff reports that she is still odd. Still expresses auditory hallucinations. She has been tolerating her medications well. Limited engagement with peers. Had called her family and expressed delusions that her husband has a child from another relationship.   Seen today. Tells me that she feels better. States that she was hearing a lot of voices before she came in. Notes that the voices has responded to treatment. Last heard them a couple of days ago. No hallucination in nay other modality. She is tolerating her medications well. Says she got confused about her medications at home. No thoughts of suicide. No thoughts of violence. I probed her on her recent belief about her husband. Says she spoke to her daughter about it and has been reassured that it is not true. No homicidal thoughts. We agreed to titrate her medications further today. Would wean off Lamictal and optimize Risperidone.   Principal Problem: Bipolar disorder, curr episode depressed, severe, w/psychotic features (HCC) Diagnosis:   Patient Active Problem List   Diagnosis Date Noted  . Bipolar disorder, curr episode depressed, severe, w/psychotic features (HCC) [F31.5] 05/03/2017  . Bunion of great toe of left foot [M21.612] 10/09/2016  . IBS (irritable bowel syndrome) [K58.9] 10/09/2016  . Perimenopause- on  Xulane [N95.1] 10/09/2016  . Insomnia disorder [G47.00] 10/09/2016  . Moderate persistent asthma-  [J45.40] 09/28/2016  . Allergic rhinoconjunctivitis [J30.9, H10.10] 09/28/2016  . Dysphagia [R13.10] 09/28/2016  . Constipation [K59.00] 01/03/2015  . Internal bleeding hemorrhoids [K64.8] 01/03/2015  . Anal bleeding [K62.5] 01/03/2015   Total Time spent with patient: 20 minutes  Past Psychiatric History: As in H&P  Past Medical History:  Past Medical History:  Diagnosis Date  . Allergy    SEASONAL  . Anxiety   . Asthma    AS A CHILD  . Depression   . IBS (irritable bowel syndrome)   . Seasonal allergies     Past Surgical History:  Procedure Laterality Date  . BUNIONECTOMY Right   . COLPOSCOPY     Family History:  Family History  Problem Relation Age of Onset  . Anxiety disorder Mother   . Depression Mother   . Allergic rhinitis Father   . Allergic rhinitis Paternal Aunt   . Allergic rhinitis Paternal Grandmother   . Cancer Paternal Grandmother        stomach  . Heart attack Maternal Grandfather   . Angioedema Neg Hx   . Asthma Neg Hx   . Atopy Neg Hx   . Eczema Neg Hx   . Immunodeficiency Neg Hx   . Urticaria Neg Hx    Family Psychiatric  History: As in H&P Social History:  History  Alcohol Use No     History  Drug Use No    Social History   Social History  . Marital status: Married  Spouse name: N/A  . Number of children: N/A  . Years of education: N/A   Social History Main Topics  . Smoking status: Never Smoker  . Smokeless tobacco: Never Used  . Alcohol use No  . Drug use: No  . Sexual activity: Yes    Birth control/ protection: Patch   Other Topics Concern  . None   Social History Narrative  . None   Additional Social History:        Sleep: Good  Appetite:  Good  Current Medications: Current Facility-Administered Medications  Medication Dose Route Frequency Provider Last Rate Last Dose  . acetaminophen (TYLENOL) tablet 650  mg  650 mg Oral Q6H PRN Nira ConnBerry, Jason A, NP      . albuterol (PROVENTIL HFA;VENTOLIN HFA) 108 (90 Base) MCG/ACT inhaler 2 puff  2 puff Inhalation Q4H PRN Nira ConnBerry, Jason A, NP      . alum & mag hydroxide-simeth (MAALOX/MYLANTA) 200-200-20 MG/5ML suspension 30 mL  30 mL Oral Q4H PRN Nira ConnBerry, Jason A, NP      . benztropine (COGENTIN) tablet 0.5 mg  0.5 mg Oral BID Jomarie LongsEappen, Saramma, MD   0.5 mg at 05/08/17 0821  . feeding supplement (ENSURE ENLIVE) (ENSURE ENLIVE) liquid 237 mL  237 mL Oral TID BM Eappen, Saramma, MD   237 mL at 05/08/17 1417  . hydrOXYzine (ATARAX/VISTARIL) tablet 25 mg  25 mg Oral Once Nira ConnBerry, Jason A, NP      . magnesium hydroxide (MILK OF MAGNESIA) suspension 30 mL  30 mL Oral Daily PRN Nira ConnBerry, Jason A, NP      . mirtazapine (REMERON) tablet 15 mg  15 mg Oral QHS Jomarie LongsEappen, Saramma, MD   15 mg at 05/07/17 2101  . montelukast (SINGULAIR) chewable tablet 10 mg  10 mg Oral QHS Nira ConnBerry, Jason A, NP   10 mg at 05/07/17 2102  . pantoprazole (PROTONIX) EC tablet 20 mg  20 mg Oral Daily Adonis BrookAgustin, Sheila, NP   20 mg at 05/08/17 [id number]  . propranolol (INDERAL) tablet 20 mg  20 mg Oral BID Jomarie LongsEappen, Saramma, MD   20 mg at 05/08/17 0821  . risperiDONE (RISPERDAL) tablet 2 mg  2 mg Oral QHS Adelyna Brockman, Delight OvensVincent A, MD   2 mg at 05/07/17 2101    Lab Results: No results found for this or any previous visit (from the past 48 hour(s)).  Blood Alcohol level:  Lab Results  Component Value Date   ETH <5 04/30/2017    Metabolic Disorder Labs: Lab Results  Component Value Date   HGBA1C 5.4 05/02/2017   MPG 108 05/02/2017   Lab Results  Component Value Date   PROLACTIN 9.8 05/02/2017   Lab Results  Component Value Date   CHOL 244 (H) 05/02/2017   TRIG 109 05/02/2017   HDL 61 05/02/2017   CHOLHDL 4.0 05/02/2017   VLDL 22 05/02/2017   LDLCALC 161 (H) 05/02/2017    Physical Findings: AIMS: Facial and Oral Movements Muscles of Facial Expression: None, normal Lips and Perioral Area: None, normal Jaw:  None, normal Tongue: None, normal,Extremity Movements Upper (arms, wrists, hands, fingers): None, normal Lower (legs, knees, ankles, toes): None, normal, Trunk Movements Neck, shoulders, hips: None, normal, Overall Severity Severity of abnormal movements (highest score from questions above): None, normal Incapacitation due to abnormal movements: None, normal Patient's awareness of abnormal movements (rate only patient's report): No Awareness, Dental Status Current problems with teeth and/or dentures?: No Does patient usually wear dentures?: No  CIWA:    COWS:  Musculoskeletal: Strength & Muscle Tone: within normal limits Gait & Station: normal Patient leans: N/A  Psychiatric Specialty Exam: Physical Exam  Constitutional: She is oriented to person, place, and time. She appears well-developed and well-nourished.  HENT:  Head: Normocephalic and atraumatic.  Eyes: Conjunctivae are normal. Pupils are equal, round, and reactive to light.  Neck: Normal range of motion. Neck supple.  Cardiovascular: Normal rate and regular rhythm.   Respiratory: Effort normal and breath sounds normal.  GI: Soft. Bowel sounds are normal.  Musculoskeletal: Normal range of motion.  Neurological: She is alert and oriented to person, place, and time.  Skin: Skin is warm.  Psychiatric:  As above    ROS  Blood pressure (!) 105/55, pulse (!) 147, temperature 98.8 F (37.1 C), temperature source Oral, resp. rate 16, height 5\' 2"  (1.575 m), weight 48.5 kg (107 lb).Body mass index is 19.57 kg/m.  General Appearance: Neatly dressed. Had her things well arranged. Had her bible open and was making notes. Pleasant. Good relatedness. Appropriate behavior. No EPS  Eye Contact:  Good  Speech:  Clear and Coherent and Normal Rate  Volume:  Normal  Mood:  Euthymic  Affect:  Appropriate and Full Range  Thought Process:  Linear  Orientation:  Full (Time, Place, and Person)  Thought Content:  No delusion currently.  No preoccupation with violent thoughts. No negative ruminations. No obsession.  No hallucination in any modality.   Suicidal Thoughts:  No  Homicidal Thoughts:  No  Memory:  Immediate;   Good Recent;   Good Remote;   Good  Judgement:  Better  Insight:  Good  Psychomotor Activity:  Normal  Concentration:  Concentration: Good and Attention Span: Good  Recall:  Good  Fund of Knowledge:  Good  Language:  Good  Akathisia:  No  Handed:    AIMS (if indicated):     Assets:  Communication Skills Desire for Improvement Housing Intimacy  ADL's:  Intact  Cognition:  WNL  Sleep:  Number of Hours: 6.25     Treatment Plan Summary: Patient is responding to treatment. Delusions and hallucinations are resolving. No violent thoughts. Ideas about infidelity are loosely held. Has agreed to further adjustment of her antipsychotic medications. Hopeful discharge early neXt week.   Psychiatric: Schizoaffective disorder  Medical: Asthma IBS  Psychosocial:   PLAN: 1. Increase Risperidone to 3 mg HS 2. Encourage unit groups and activities 3. Continue to monitor mood, behavior and interaction with peers    Georgiann Cocker, MD 05/08/2017, 4:12 PM

## 2017-05-09 MED ORDER — LORAZEPAM 0.5 MG PO TABS
0.5000 mg | ORAL_TABLET | Freq: Four times a day (QID) | ORAL | Status: DC | PRN
  Administered 2017-05-09: 0.5 mg via ORAL
  Filled 2017-05-09: qty 1

## 2017-05-09 NOTE — Progress Notes (Signed)
D:  Patient denied SI and HI, contracts for safety.  Denied A/V hallucinations.   A:  Medications administered per MD orders.  Emotional support and encouragement given patient. R:  Safety maintained with 15 minute checks.  

## 2017-05-09 NOTE — [facility] Group Notes (Signed)
[facility] LCSW Group Therapy  05/09/2017  11 AM  Type of Therapy:  Group Therapy  Participation Level:  Did Not Attend; invited to participate yet did not despite overhead announcement and encouragement by staff   Summary of Progress/Problems: Topic for today was thoughts and feelings regarding discharge. We discussed fears of upcoming changes including judgements, expectations and stigma of mental health issues. We then discussed supports: what constitutes a supportive framework, identification of supports and what to do when others are not supportive. Patients processed their greatest challenges  Catherine C Harrill, LCSW    

## 2017-05-09 NOTE — Progress Notes (Signed)
Psychoeducational Group Note  Date:  05/09/2017 Time:  2030 Group Topic/Focus:  wrap up group  Participation Level: Did Not Attend  Participation Quality:  Not Applicable  Affect:  Not Applicable  Cognitive:  Not Applicable  Insight:  Not Applicable  Engagement in Group: Not Applicable  Additional Comments: Pt was encouraged to attend the evening wrap up group but politely declined and remained in bed.   Marcille BuffyMcNeil, Arryanna Holquin S 05/09/2017, 8:40 PM

## 2017-05-09 NOTE — Progress Notes (Signed)
Patient's husband visited patient this afternoon, daughter present.  Husband is Malon KindleFabian Espana phone [phone].  Please call husband.  Husband would like a family meeting before discharge.  Husband would like to talk about patient's care.  Husband does not think his wife has improved.  Patient believes his wife should be taking the [facility].  The new medications are not working, that wife needs to be taking geodon.  Husband is afraid wife will lose her job.  He told her employer that she would be back at work on Thursday/Friday.  Husband is worried they might lose their home.  Husband stated his wife is "loopey" again.   Wife feels she is progressing.  Husband wants wife discharged so she can return [facility].  Husband stated they cleaned out the house so wife cannot find a way to hurt herself again.

## 2017-05-09 NOTE — Progress Notes (Signed)
Nursing Progress Note: 7p-7a D: Pt currently presents with a anxious affect and behavior. Pt states "I've had a better day today. I haven't heard any voices." Interacting appropriately with milieu. Pt reports good sleep with current medication regimen.   A: Pt provided with medications per providers orders. Pt's labs and vitals were monitored throughout the night. Pt supported emotionally and encouraged to express concerns and questions. Pt educated on medications.  R: Pt's safety ensured with 15 minute and environmental checks. Pt currently denies SI/HI/Self Harm and AVH. Pt verbally contracts to seek staff if SI/HI or A/VH occurs and to consult with staff before acting on any harmful thoughts. Will continue to monitor.

## 2017-05-09 NOTE — Progress Notes (Signed)
[facility] MD Progress Note  05/09/2017 1:00 PM KELSEA MOUSEL  MRN:  [id number] Subjective:   Patient is 46 year old Caucasian, employed female who was admitted due to having paranoia, hallucination, poor sleep and having thoughts to take her own life.  Patient cut her arm with a knife which required stitches.  She admitted it was a suicidal attempt because she does not want to live anymore.  Patient also reported that she feels people are staring at her and out to get her.  She endorse that she's been hearing voices mostly music. Has not been adherent with Geodon for over a month.   Chart reviewed today. Patient discussed at team.   Staff reports patient has been grooming self better. She is reacting appropriately to her environment. She has not been observed to be internally distracted. She denies hallucinations. She has not made any abnormal statement. She has not voiced any thoughts of violence. She slept well last night.   Seen today. Remains in good spirits. Tolerated recent medication adjustment well. No hallucination in any modality. No delusional memory. No delusional theme. No thoughtsof violence towards self or otehrs. Looking forward to discharge. Says she is disappointed to be here on Mother's day. Her family is visiting this evening.  Principal Problem: Bipolar disorder, curr episode depressed, severe, w/psychotic features (HCC) Diagnosis:   Patient Active Problem List   Diagnosis Date Noted  . Bipolar disorder, curr episode depressed, severe, w/psychotic features (HCC) [F31.5] 05/03/2017  . Bunion of great toe of left foot [M21.612] 10/09/2016  . IBS (irritable bowel syndrome) [K58.9] 10/09/2016  . Perimenopause- on Xulane [N95.1] 10/09/2016  . Insomnia disorder [G47.00] 10/09/2016  . Moderate persistent asthma-  [J45.40] 09/28/2016  . Allergic rhinoconjunctivitis [J30.9, H10.10] 09/28/2016  . Dysphagia [R13.10] 09/28/2016  . Constipation [K59.00] 01/03/2015  . Internal bleeding  hemorrhoids [K64.8] 01/03/2015  . Anal bleeding [K62.5] 01/03/2015   Total Time spent with patient: 20 minutes  Past Psychiatric History: As in H&P  Past Medical History:  Past Medical History:  Diagnosis Date  . Allergy    SEASONAL  . Anxiety   . Asthma    AS A CHILD  . Depression   . IBS (irritable bowel syndrome)   . Seasonal allergies     Past Surgical History:  Procedure Laterality Date  . BUNIONECTOMY Right   . COLPOSCOPY     Family History:  Family History  Problem Relation Age of Onset  . Anxiety disorder Mother   . Depression Mother   . Allergic rhinitis Father   . Allergic rhinitis Paternal Aunt   . Allergic rhinitis Paternal Grandmother   . Cancer Paternal Grandmother        stomach  . Heart attack Maternal Grandfather   . Angioedema Neg Hx   . Asthma Neg Hx   . Atopy Neg Hx   . Eczema Neg Hx   . Immunodeficiency Neg Hx   . Urticaria Neg Hx    Family Psychiatric  History: As in H&P Social History:  History  Alcohol Use No     History  Drug Use No    Social History   Social History  . Marital status: Married    Spouse name: N/A  . Number of children: N/A  . Years of education: N/A   Social History Main Topics  . Smoking status: Never Smoker  . Smokeless tobacco: Never Used  . Alcohol use No  . Drug use: No  . Sexual activity: Yes  Birth control/ protection: Patch   Other Topics Concern  . None   Social History Narrative  . None   Additional Social History:        Sleep: Good  Appetite:  Good  Current Medications: Current Facility-Administered Medications  Medication Dose Route Frequency Provider Last Rate Last Dose  . acetaminophen (TYLENOL) tablet 650 mg  650 mg Oral Q6H PRN Nira Conn A, NP      . albuterol (PROVENTIL HFA;VENTOLIN HFA) 108 (90 Base) MCG/ACT inhaler 2 puff  2 puff Inhalation Q4H PRN Nira Conn A, NP      . alum & mag hydroxide-simeth (MAALOX/MYLANTA) 200-200-20 MG/5ML suspension 30 mL  30 mL Oral  Q4H PRN Nira Conn A, NP      . benztropine (COGENTIN) tablet 0.5 mg  0.5 mg Oral BID Jomarie Longs, MD   0.5 mg at 05/09/17 0817  . feeding supplement (ENSURE ENLIVE) (ENSURE ENLIVE) liquid 237 mL  237 mL Oral TID BM Eappen, Saramma, MD   237 mL at 05/09/17 1015  . hydrOXYzine (ATARAX/VISTARIL) tablet 25 mg  25 mg Oral Once Nira Conn A, NP      . LORazepam (ATIVAN) tablet 0.5 mg  0.5 mg Oral Q6H PRN Izediuno, Delight Ovens, MD   0.5 mg at 05/09/17 1016  . magnesium hydroxide (MILK OF MAGNESIA) suspension 30 mL  30 mL Oral Daily PRN Nira Conn A, NP      . mirtazapine (REMERON) tablet 15 mg  15 mg Oral QHS Jomarie Longs, MD   15 mg at 05/08/17 2212  . montelukast (SINGULAIR) chewable tablet 10 mg  10 mg Oral QHS Nira Conn A, NP   10 mg at 05/08/17 2212  . pantoprazole (PROTONIX) EC tablet 20 mg  20 mg Oral Daily Adonis Brook, NP   20 mg at 05/09/17 0817  . propranolol (INDERAL) tablet 20 mg  20 mg Oral BID Jomarie Longs, MD   20 mg at 05/09/17 0817  . risperiDONE (RISPERDAL) tablet 3 mg  3 mg Oral QHS Izediuno, Delight Ovens, MD   3 mg at 05/08/17 2212    Lab Results: No results found for this or any previous visit (from the past 48 hour(s)).  Blood Alcohol level:  Lab Results  Component Value Date   ETH <5 04/30/2017    Metabolic Disorder Labs: Lab Results  Component Value Date   HGBA1C 5.4 05/02/2017   MPG 108 05/02/2017   Lab Results  Component Value Date   PROLACTIN 9.8 05/02/2017   Lab Results  Component Value Date   CHOL 244 (H) 05/02/2017   TRIG 109 05/02/2017   HDL 61 05/02/2017   CHOLHDL 4.0 05/02/2017   VLDL 22 05/02/2017   LDLCALC 161 (H) 05/02/2017    Physical Findings: AIMS: Facial and Oral Movements Muscles of Facial Expression: None, normal Lips and Perioral Area: None, normal Jaw: None, normal Tongue: None, normal,Extremity Movements Upper (arms, wrists, hands, fingers): None, normal Lower (legs, knees, ankles, toes): None, normal, Trunk  Movements Neck, shoulders, hips: None, normal, Overall Severity Severity of abnormal movements (highest score from questions above): None, normal Incapacitation due to abnormal movements: None, normal Patient's awareness of abnormal movements (rate only patient's report): No Awareness, Dental Status Current problems with teeth and/or dentures?: No Does patient usually wear dentures?: No  CIWA:  CIWA-Ar Total: 1 COWS:  COWS Total Score: 3  Musculoskeletal: Strength & Muscle Tone: within normal limits Gait & Station: normal Patient leans: N/A  Psychiatric Specialty Exam: Physical  Exam  Constitutional: She is oriented to person, place, and time. She appears well-developed and well-nourished.  HENT:  Head: Normocephalic and atraumatic.  Eyes: Conjunctivae are normal. Pupils are equal, round, and reactive to light.  Neck: Normal range of motion. Neck supple.  Cardiovascular: Normal rate and regular rhythm.   Respiratory: Effort normal and breath sounds normal.  GI: Soft. Bowel sounds are normal.  Musculoskeletal: Normal range of motion.  Neurological: She is alert and oriented to person, place, and time.  Skin: Skin is warm.  Psychiatric:  As above    ROS  Blood pressure 106/69, pulse (!) 110, temperature 98.4 F (36.9 C), resp. rate 16, height 5\' 2"  (1.575 m), weight 48.5 kg (107 lb).Body mass index is 19.57 kg/m.  General Appearance: Neatly dressed. Had her things well arranged. Had her bible open and was making notes. Pleasant. Good relatedness. Appropriate behavior. No EPS  Eye Contact:  Good  Speech:  Spontaneous, normal prosody. Normal tone and rate.   Volume:  Normal  Mood:  Euthymic  Affect:  Appropriate and Full Range  Thought Process:  Linear  Orientation:  Full (Time, Place, and Person)  Thought Content:  No delusion currently. No preoccupation with violent thoughts. No negative ruminations. No obsession.  No hallucination in any modality.   Suicidal Thoughts:  No   Homicidal Thoughts:  No  Memory:  Immediate;   Good Recent;   Good Remote;   Good  Judgement:  Better  Insight:  Good  Psychomotor Activity:  Normal  Concentration:  Concentration: Good and Attention Span: Good  Recall:  Good  Fund of Knowledge:  Good  Language:  Good  Akathisia:  No  Handed:    AIMS (if indicated):     Assets:  Communication Skills Desire for Improvement Housing Intimacy  ADL's:  Intact  Cognition:  WNL  Sleep:  Number of Hours: 4.5     Treatment Plan Summary: Psychosis has responded well to treatment. No dangerousness. No depression. Hopeful discharge tomorrow.   Psychiatric: Schizoaffective disorder  Medical: Asthma IBS  Psychosocial:   PLAN: 1. Continue current regimen 2. Family would provide feedback when they visit 3. Home tomorrow if she maintains stability.    Georgiann CockerVincent A Izediuno, MD 05/09/2017, 1:00 PMPatient ID: Darrel HooverKimberly D Mcglinn, female   DOB: 1971-02-22, 46 y.o.   MRN: [id number]

## 2017-05-09 NOTE — Progress Notes (Signed)
D: Pt at the time of assessment was isolative and withdrawn to room: remained in bed all evening. Pt at the time endorsed moderate anxiety, depression and raising thoughts; states, "I have a lot in my mind. Pt denied pain, SI, HI or AVH. Pt remained calm and cooperative. A: Medications offered as prescribed. All patient's questions and concerns addressed. Support, encouragement, and safe environment provided. Will continue to monitor for any changes. 15-minute safety checks continue. R: Pt was med compliant. Pt did not attend wrap-up group. Safety checks continue.

## 2017-05-09 NOTE — Progress Notes (Addendum)
Patient's self inventory sheet, patient sleeps good, no sleep medication given.  Fair appetite, low energy level,  Denied depression, rated hopeless #8 because of mother's day, always spent time at home with daughter.  Denied SI.  Physical problems, nausea.  No pain medication.  Goal is see family on mother's day.  Plans to develop discharge plan for Monday.  Needs to fill out papers for Monday.

## 2017-05-09 NOTE — Plan of Care (Signed)
Problem: Education: Goal: Knowledge of [facility] Education information/materials will improve Outcome: Progressing Nurse discussed depression/anxiety/coping skills with patient.    

## 2017-05-10 MED ORDER — PANTOPRAZOLE SODIUM 20 MG PO TBEC: 20.0000 mg | DELAYED_RELEASE_TABLET | Freq: Every day | ORAL | 0 refills | Status: DC

## 2017-05-10 MED ORDER — ALBUTEROL SULFATE HFA 108 (90 BASE) MCG/ACT IN AERS: 2.0000 | INHALATION_SPRAY | RESPIRATORY_TRACT | 1 refills | Status: DC | PRN

## 2017-05-10 MED ORDER — RISPERIDONE 3 MG PO TABS: 3.0000 mg | ORAL_TABLET | Freq: Every day | ORAL | 0 refills | Status: DC

## 2017-05-10 MED ORDER — HYDROXYZINE HCL 25 MG PO TABS: 25.0000 mg | ORAL_TABLET | Freq: Once | ORAL | 0 refills | Status: DC

## 2017-05-10 MED ORDER — MIRTAZAPINE 15 MG PO TABS: 15.0000 mg | ORAL_TABLET | Freq: Every day | ORAL | 0 refills | Status: DC

## 2017-05-10 MED ORDER — BENZTROPINE MESYLATE 0.5 MG PO TABS: 0.5000 mg | ORAL_TABLET | Freq: Two times a day (BID) | ORAL | 0 refills | Status: DC

## 2017-05-10 MED FILL — MIRTAZAPINE 15 MG TABLET: 15 | 30 days supply | Qty: 30 | Fill #0

## 2017-05-10 NOTE — [facility] Suicide Risk Assessment (Signed)
[facility]   Principal Problem: Bipolar disorder, curr episode depressed, severe, w/psychotic features [facility](HCC) Discharge Diagnoses:  Patient Active Problem List   Diagnosis Date Noted  . Bipolar disorder, curr episode depressed, severe, w/psychotic features (HCC) [F31.5] 05/03/2017  . Bunion of great toe of left foot [M21.612] 10/09/2016  . IBS (irritable bowel syndrome) [K58.9] 10/09/2016  . Perimenopause- on Xulane [N95.1] 10/09/2016  . Insomnia disorder [G47.00] 10/09/2016  . Moderate persistent asthma-  [J45.40] 09/28/2016  . Allergic rhinoconjunctivitis [J30.9, H10.10] 09/28/2016  . Dysphagia [R13.10] 09/28/2016  . Constipation [K59.00] 01/03/2015  . Internal bleeding hemorrhoids [K64.8] 01/03/2015  . Anal bleeding [K62.5] 01/03/2015    Total Time spent with patient: 30 minutes  Musculoskeletal: Strength & Muscle Tone: within normal limits Gait & Station: normal Patient leans: N/A  Psychiatric Specialty Exam: Review of Systems  Psychiatric/Behavioral: Negative for depression, hallucinations and suicidal ideas.  All other systems reviewed and are negative.   Blood pressure (!) 84/67, pulse (!) 124, temperature 97.8 F (36.6 C), temperature source Oral, resp. rate 20, height 5\' 2"  (1.575 m), weight 48.5 kg (107 lb).Body mass index is 19.57 kg/m.  General Appearance: Casual  Eye Contact::  Fair  Speech:  Clear and Coherent409  Volume:  Normal  Mood:  Euthymic  Affect:  Appropriate  Thought Process:  Goal Directed and Descriptions of Associations: Intact  Orientation:  Full (Time, Place, and Person)  Thought Content:  Logical  Suicidal Thoughts:  No  Homicidal Thoughts:  No  Memory:  Immediate;   Fair Recent;   Fair Remote;   Fair  Judgement:  Fair  Insight:  Fair  Psychomotor Activity:  Normal  Concentration:  Fair  Recall:  FiservFair  Fund of Knowledge:Fair  Language: Fair  Akathisia:  No  Handed:  Right  AIMS (if indicated):   0   Assets:  Communication Skills Desire for Improvement  Sleep:  Number of Hours: 6.25  Cognition: WNL  ADL's:  Intact   Mental Status Per Nursing Assessment::   On Admission:  Suicidal ideation indicated by patient, Suicidal ideation indicated by others, Self-harm behaviors, Self-harm thoughts  Demographic Factors:  Caucasian  Loss Factors: NA  Historical Factors: Impulsivity  Risk Reduction Factors:   Positive social support and Positive therapeutic relationship  Continued Clinical Symptoms:  Previous Psychiatric Diagnoses and Treatments  Cognitive Features That Contribute To Risk:  None    Suicide Risk:  Minimal: No identifiable suicidal ideation.  Patients presenting with no risk factors but with morbid ruminations; may be classified as minimal risk based on the severity of the depressive symptoms  Follow-up Information    [facility] Follow up on 05/11/2017.   Why:  Appt on Tuesday with Claudia at 5:00PM for therapy. You will be scheduled for medication management at this appt. Contact information: [address] [place], [place] [address] Phone: [phone] Fax: [phone]          Plan Of Care/Follow-up recommendations:  Activity:  no restrictions Diet:  regular Tests:  as needed Other:  follow up with outpatient provider  Tatyana Biber, MD 05/10/2017, 10:03 AM

## 2017-05-10 NOTE — Discharge Summary (Signed)
Physician Discharge Summary Note  Patient:  Shelia Ramos is an 46 y.o., female MRN:  [id number] DOB:  Apr 22, 1971 Patient phone:  [phone] (home)  Patient address:   [address] [place] [address],  Total Time spent with patient: 45 minutes  Date of Admission:  05/01/2017 Date of Discharge: 05/10/2017  Reason for Admission:  Suicidal attempt by cutting arm  Principal Problem: Bipolar disorder, curr episode depressed, severe, w/psychotic features [facility](HCC) Discharge Diagnoses: Patient Active Problem List   Diagnosis Date Noted  . Bipolar disorder, curr episode depressed, severe, w/psychotic features (HCC) [F31.5] 05/03/2017  . Bunion of great toe of left foot [M21.612] 10/09/2016  . IBS (irritable bowel syndrome) [K58.9] 10/09/2016  . Perimenopause- on Xulane [N95.1] 10/09/2016  . Insomnia disorder [G47.00] 10/09/2016  . Moderate persistent asthma-  [J45.40] 09/28/2016  . Allergic rhinoconjunctivitis [J30.9, H10.10] 09/28/2016  . Dysphagia [R13.10] 09/28/2016  . Constipation [K59.00] 01/03/2015  . Internal bleeding hemorrhoids [K64.8] 01/03/2015  . Anal bleeding [K62.5] 01/03/2015    Past Psychiatric History: see HPI  Past Medical History:  Past Medical History:  Diagnosis Date  . Allergy    SEASONAL  . Anxiety   . Asthma    AS A CHILD  . Depression   . IBS (irritable bowel syndrome)   . Seasonal allergies     Past Surgical History:  Procedure Laterality Date  . BUNIONECTOMY Right   . COLPOSCOPY     Family History:  Family History  Problem Relation Age of Onset  . Anxiety disorder Mother   . Depression Mother   . Allergic rhinitis Father   . Allergic rhinitis Paternal Aunt   . Allergic rhinitis Paternal Grandmother   . Cancer Paternal Grandmother        stomach  . Heart attack Maternal Grandfather   . Angioedema Neg Hx   . Asthma Neg Hx   . Atopy Neg Hx   . Eczema Neg Hx   . Immunodeficiency Neg Hx   . Urticaria Neg Hx    Family Psychiatric   History: see HPI Social History:  History  Alcohol Use No     History  Drug Use No    Social History   Social History  . Marital status: Married    Spouse name: N/A  . Number of children: N/A  . Years of education: N/A   Social History Main Topics  . Smoking status: Never Smoker  . Smokeless tobacco: Never Used  . Alcohol use No  . Drug use: No  . Sexual activity: Yes    Birth control/ protection: Patch   Other Topics Concern  . None   Social History Narrative  . None    Hospital Course:  Shelia FergusonKimberly Ramos, 46 year old Caucasian, employed female who was admitted due to having paranoia, hallucination, poor sleep and having thoughts to take her own life.  Patient cut her arm with a knife which required stitches.  She admitted it was a suicidal attempt because she does not want to live anymore.   Shelia Ramos was admitted for Bipolar disorder, curr episode depressed, severe, w/psychotic features (HCC) and crisis management.  Patient was treated with medications with their indications listed below in detail under Medication List.  Medical problems were identified and treated as needed.  Home medications were restarted as appropriate.  Improvement was monitored by observation and Shelia Ramos daily report of symptom reduction.  Emotional and mental status was monitored by daily self inventory reports completed by Shelia [name] and clinical staff.  Patient reported continued improvement, denied any new concerns.  Patient had been compliant on medications and denied side effects.  Support and encouragement was provided.         Shelia Ramos was evaluated by the treatment team for stability and plans for continued recovery upon discharge.  Patient was offered further treatment options upon discharge including Residential, Intensive Outpatient and Outpatient treatment. Patient will follow up with agency listed below for medication management and counseling.   Encouraged patient to maintain satisfactory support network and home environment.  Advised to adhere to medication compliance and outpatient treatment follow up.  Prescriptions provided.       Shelia Ramos motivation was an integral factor for scheduling further treatment.  Employment, transportation, bed availability, health status, family support, and any pending legal issues were also considered during patient's hospital stay.  Upon completion of this admission the patient was both mentally and medically stable for discharge denying suicidal/homicidal ideation, auditory/visual/tactile hallucinations, delusional thoughts and paranoia.      Physical Findings: AIMS: Facial and Oral Movements Muscles of Facial Expression: None, normal Lips and Perioral Area: None, normal Jaw: None, normal Tongue: None, normal,Extremity Movements Upper (arms, wrists, hands, fingers): None, normal Lower (legs, knees, ankles, toes): None, normal, Trunk Movements Neck, shoulders, hips: None, normal, Overall Severity Severity of abnormal movements (highest score from questions above): None, normal Incapacitation due to abnormal movements: None, normal Patient's awareness of abnormal movements (rate only patient's report): No Awareness, Dental Status Current problems with teeth and/or dentures?: No Does patient usually wear dentures?: No  CIWA:  CIWA-Ar Total: 1 COWS:  COWS Total Score: 3  Musculoskeletal: Strength & Muscle Tone: within normal limits Gait & Station: normal Patient leans: N/A  Psychiatric Specialty Exam:  See MD SRA Physical Exam  Nursing note and vitals reviewed.   ROS  Blood pressure (!) 84/67, pulse (!) 124, temperature 97.8 F (36.6 C), temperature source Oral, resp. rate 20, height 5\' 2"  (1.575 m), weight 48.5 kg (107 lb).Body mass index is 19.57 kg/m.    Have you used any form of tobacco in the last 30 days? (Cigarettes, Smokeless Tobacco, Cigars, and/or Pipes): No  Has this  patient used any form of tobacco in the last 30 days? (Cigarettes, Smokeless Tobacco, Cigars, and/or Pipes) Yes, N/A  Blood Alcohol level:  Lab Results  Component Value Date   ETH <5 04/30/2017    Metabolic Disorder Labs:  Lab Results  Component Value Date   HGBA1C 5.4 05/02/2017   MPG 108 05/02/2017   Lab Results  Component Value Date   PROLACTIN 9.8 05/02/2017   Lab Results  Component Value Date   CHOL 244 (H) 05/02/2017   TRIG 109 05/02/2017   HDL 61 05/02/2017   CHOLHDL 4.0 05/02/2017   VLDL 22 05/02/2017   LDLCALC 161 (H) 05/02/2017    See Psychiatric Specialty Exam and Suicide Risk Assessment completed by Attending Physician prior to discharge.  Discharge destination:  Home  Is patient on multiple antipsychotic therapies at discharge:  No   Has Patient had three or more failed trials of antipsychotic monotherapy by history:  No  Recommended Plan for Multiple Antipsychotic Therapies: NA   Allergies as of 05/10/2017   No Known Allergies     Medication List    STOP taking these medications   OSCIMIN 0.125 MG Subl Generic drug:  Hyoscyamine Sulfate SL     TAKE these medications     Indication  albuterol 108 (90 Base) MCG/ACT inhaler Commonly known as:  PROAIR HFA Inhale 2 puffs into the lungs every 4 (four) hours as needed for wheezing or shortness of breath.  Indication:  Exercise-Induced Bronchospastic Disease   benztropine 0.5 MG tablet Commonly known as:  COGENTIN Take 1 tablet (0.5 mg total) by mouth 2 (two) times daily.  Indication:  Extrapyramidal Reaction caused by Medications   hydrOXYzine 25 MG tablet Commonly known as:  ATARAX/VISTARIL Take 1 tablet (25 mg total) by mouth once.  Indication:  Anxiety Neurosis   mirtazapine 15 MG tablet Commonly known as:  REMERON Take 1 tablet (15 mg total) by mouth at bedtime.  Indication:  Major Depressive Disorder   montelukast 5 MG chewable tablet Commonly known as:  SINGULAIR Take two tablets  each evening.  Indication:  Perennial Allergic Rhinitis, Hayfever   pantoprazole 20 MG tablet Commonly known as:  PROTONIX Take 1 tablet (20 mg total) by mouth daily. Start taking on:  05/11/2017  Indication:  Gastroesophageal Reflux Disease   risperiDONE 3 MG tablet Commonly known as:  RISPERDAL Take 1 tablet (3 mg total) by mouth at bedtime.  Indication:  mood stabilization      Follow-up Information    [facility] Follow up on 05/11/2017.   Why:  Appt on Tuesday with Claudia at 5:00PM for therapy. You will be scheduled for medication management at this appt. Contact information: [address] [place], [place] [address] Phone: [phone] Fax: [phone]          Follow-up recommendations:  Activity:  as tol Diet:  as tol  Comments:  1.  Take all your medications as prescribed.   2.  Report any adverse side effects to outpatient provider. 3.  Patient instructed to not use alcohol or illegal drugs while on prescription medicines. 4.  In the event of worsening symptoms, instructed patient to call 911, the crisis hotline or go to nearest emergency room for evaluation of symptoms.  Signed: Lindwood Qua, NP [facility]  05/10/2017, 1:17 PM

## 2017-05-10 NOTE — Plan of Care (Signed)
Problem: [facility] Participation in Recreation Therapeutic Interventions Goal: STG-Patient will identify at least five coping skills for ** STG: Coping Skills - Patient will be able to identify at least 5 coping skills for anxiety.by conclusion of recreation therapy tx  Outcome: Completed/Met Date Met: 05/10/17 Pt was able to identify coping skills at completion of coping skills recreation therapy sessions.  Victorino Sparrow, LRT/CTRS

## 2017-05-10 NOTE — Progress Notes (Signed)
Pt d/c from the hospital with her daughter. All items returned. D/C instructions given and prescriptions given. Pt denies si and hi. 

## 2017-05-10 NOTE — Progress Notes (Signed)
Recreation Therapy Notes  Date: 05/10/17 Time: 1000 Location: 300 [address] Dayroom  Group Topic: Coping Skills  Goal Area(s) Addresses:  Patient will be able to identify positive coping skills. Patient will be able to identify benefits of coping skills. Patient will be able to identify benefits of using coping skills post d/c.  Intervention: Coping skills worksheet, magazines, scissors, glue sticks, construction paper  Activity: Coping skills collage.  Patients were to identify coping skills that can be used for diversions, cognitive, social, tension releasers and for physical.   Education: Coping Skills, Discharge Planning.   Education Outcome: Acknowledges understanding/In group clarification offered/Needs additional education.   Clinical Observations/Feedback: Pt did not attend group.   Caroll RancherMarjette Gabrian Hoque, LRT/CTRS         Caroll RancherLindsay, Cory Rama A 05/10/2017 12:03 PM

## 2017-05-10 NOTE — Progress Notes (Signed)
  [facility] L[facility] ENTRY FOR CSW Beverely PaceBryant  Will you be returning to the same living situation after discharge:  Yes,  w daughter At discharge, do you have transportation home?: Yes,  daughter Do you have the ability to pay for your medications: Yes,  has insurance, no concerns expressed  Release of information consent forms completed and in the chart;  Patient's signature needed at discharge.  Patient to Follow up at: Follow-up Information    [facility] Follow up on 05/11/2017.   Why:  Appt on Tuesday with Claudia at 5:00PM for therapy. You will be scheduled for medication management at this appt. Contact information: [address] [place], [place] [address] Phone: [phone] Fax: [phone]          Next level of care provider [facility] Link:no  Safety Planning and Suicide Prevention discussed: Yes,  reviewed w daughter and patient  Have you used any form of tobacco in the last 30 days? (Cigarettes, Smokeless Tobacco, Cigars, and/or Pipes): No  Has patient been referred to the Quitline?: N/A patient is not a smoker  Patient has been referred for addiction treatment: Yes  Sallee Langenne C Cunningham 05/10/2017, 5:05 PM

## 2017-05-11 DIAGNOSIS — F411 Generalized anxiety disorder: Secondary | ICD-10-CM | POA: Diagnosis not present

## 2017-05-11 DIAGNOSIS — F319 Bipolar disorder, unspecified: Secondary | ICD-10-CM | POA: Diagnosis not present

## 2017-05-11 DIAGNOSIS — G4700 Insomnia, unspecified: Secondary | ICD-10-CM | POA: Diagnosis not present

## 2017-05-12 DIAGNOSIS — F411 Generalized anxiety disorder: Secondary | ICD-10-CM | POA: Diagnosis not present

## 2017-05-12 DIAGNOSIS — F319 Bipolar disorder, unspecified: Secondary | ICD-10-CM | POA: Diagnosis not present

## 2017-05-12 DIAGNOSIS — G4700 Insomnia, unspecified: Secondary | ICD-10-CM | POA: Diagnosis not present

## 2017-05-25 DIAGNOSIS — F411 Generalized anxiety disorder: Secondary | ICD-10-CM | POA: Diagnosis not present

## 2017-05-25 DIAGNOSIS — F319 Bipolar disorder, unspecified: Secondary | ICD-10-CM | POA: Diagnosis not present

## 2017-05-25 DIAGNOSIS — G4700 Insomnia, unspecified: Secondary | ICD-10-CM | POA: Diagnosis not present

## 2017-06-02 MED FILL — MONTELUKAST SOD 5 MG TAB CH: 5 | 30 days supply | Qty: 60 | Fill #5

## 2017-06-14 MED FILL — MIRTAZAPINE 15 MG TABLET: 15 | 30 days supply | Qty: 30 | Fill #0

## 2017-06-14 MED FILL — HYDROXYZINE PAM 25 MG CAP: 25 | 30 days supply | Qty: 30 | Fill #0

## 2017-06-16 DIAGNOSIS — F319 Bipolar disorder, unspecified: Secondary | ICD-10-CM | POA: Diagnosis not present

## 2017-06-16 DIAGNOSIS — G4700 Insomnia, unspecified: Secondary | ICD-10-CM | POA: Diagnosis not present

## 2017-06-16 DIAGNOSIS — F411 Generalized anxiety disorder: Secondary | ICD-10-CM | POA: Diagnosis not present

## 2017-07-01 DIAGNOSIS — F411 Generalized anxiety disorder: Secondary | ICD-10-CM | POA: Diagnosis not present

## 2017-07-01 DIAGNOSIS — G4700 Insomnia, unspecified: Secondary | ICD-10-CM | POA: Diagnosis not present

## 2017-07-01 DIAGNOSIS — F319 Bipolar disorder, unspecified: Secondary | ICD-10-CM | POA: Diagnosis not present

## 2017-07-02 DIAGNOSIS — H01003 Unspecified blepharitis right eye, unspecified eyelid: Secondary | ICD-10-CM | POA: Diagnosis not present

## 2017-07-02 DIAGNOSIS — H04123 Dry eye syndrome of bilateral lacrimal glands: Secondary | ICD-10-CM | POA: Diagnosis not present

## 2017-07-02 DIAGNOSIS — H25013 Cortical age-related cataract, bilateral: Secondary | ICD-10-CM | POA: Diagnosis not present

## 2017-07-02 DIAGNOSIS — H1013 Acute atopic conjunctivitis, bilateral: Secondary | ICD-10-CM | POA: Diagnosis not present

## 2017-07-02 LAB — HM DIABETES EYE EXAM

## 2017-07-08 ENCOUNTER — Other Ambulatory Visit: Payer: Self-pay | Admitting: Gastroenterology

## 2017-07-08 ENCOUNTER — Other Ambulatory Visit: Payer: Self-pay | Admitting: Allergy and Immunology

## 2017-07-08 MED FILL — OSCIMIN SL 0.125 MG TABLET: 0.125 | 15 days supply | Qty: 60 | Fill #0

## 2017-07-08 MED FILL — MONTELUKAST SOD 5 MG TAB CH: 5 | 30 days supply | Qty: 60 | Fill #0

## 2017-07-08 MED FILL — BENZTROPINE MES 0.5 MG TAB: 0.5 | 30 days supply | Qty: 60 | Fill #0

## 2017-07-08 MED FILL — risperiDONE 3 MG TABS: 3 | 30 days supply | Qty: 30 | Fill #0

## 2017-07-12 MED FILL — MIRTAZAPINE 15 MG TABLET: 15 | 30 days supply | Qty: 30 | Fill #1

## 2017-07-14 DIAGNOSIS — Z30431 Encounter for routine checking of intrauterine contraceptive device: Secondary | ICD-10-CM | POA: Diagnosis not present

## 2017-07-14 DIAGNOSIS — Z30430 Encounter for insertion of intrauterine contraceptive device: Secondary | ICD-10-CM | POA: Diagnosis not present

## 2017-08-10 ENCOUNTER — Other Ambulatory Visit: Payer: Self-pay | Admitting: Allergy and Immunology

## 2017-08-10 MED FILL — risperiDONE 3 MG TABS: 3 | 30 days supply | Qty: 30 | Fill #1

## 2017-08-10 MED FILL — MIRTAZAPINE 15 MG TABLET: 15 | 30 days supply | Qty: 30 | Fill #2

## 2017-08-13 ENCOUNTER — Ambulatory Visit (INDEPENDENT_AMBULATORY_CARE_PROVIDER_SITE_OTHER): Payer: 59 | Admitting: Allergy

## 2017-08-13 ENCOUNTER — Encounter: Payer: Self-pay | Admitting: Allergy

## 2017-08-13 ENCOUNTER — Ambulatory Visit: Payer: 59 | Admitting: Allergy

## 2017-08-13 VITALS — BP 102/70 | HR 98 | Resp 17 | Wt 131.8 lb

## 2017-08-13 DIAGNOSIS — K219 Gastro-esophageal reflux disease without esophagitis: Secondary | ICD-10-CM

## 2017-08-13 DIAGNOSIS — J4540 Moderate persistent asthma, uncomplicated: Secondary | ICD-10-CM | POA: Diagnosis not present

## 2017-08-13 DIAGNOSIS — J309 Allergic rhinitis, unspecified: Secondary | ICD-10-CM

## 2017-08-13 DIAGNOSIS — H1010 Acute atopic conjunctivitis, unspecified eye: Secondary | ICD-10-CM | POA: Diagnosis not present

## 2017-08-13 MED ORDER — MONTELUKAST SODIUM 5 MG PO CHEW: CHEWABLE_TABLET | ORAL | 5 refills | Status: DC

## 2017-08-13 MED ORDER — BECLOMETHASONE DIPROP HFA 80 MCG/ACT IN AERB: 2.0000 | INHALATION_SPRAY | Freq: Every day | RESPIRATORY_TRACT | 5 refills | Status: DC

## 2017-08-13 MED FILL — QVAR REDIHALER 80 MCG/ACT A: 80 | 30 days supply | Qty: 11 | Fill #0

## 2017-08-13 MED FILL — MONTELUKAST SOD 5 MG TAB CH: 5 | 30 days supply | Qty: 60 | Fill #0

## 2017-08-13 NOTE — Patient Instructions (Addendum)
Asthma - Cough at night is likely multifactorial with asthma and reflux - will change your Qvar puffer to the Qvar redihaler 2 puffs daily (take at bedtime)  - Continue Singulair 5 mg chewables 2 tabs at bedtime - Continue albuterol inhaler 2 puffs every 4-6 hours as needed for cough or wheeze. Monitor frequency of use -Recommend flu vaccine this season Asthma control goals:   Full participation in all desired activities (may need albuterol before activity)  Albuterol use two time or less a week on average (not counting use with activity)  Cough interfering with sleep two time or less a month  Oral steroids no more than once a year  No hospitalizations  Allergic rhinitis -Use Flonase 1-2 sprays each nostril as needed for runny nose or congestion. Advised to use Flonase for a week or 2 at a time before discontinuing. Demonstrate a proper nasal spray technique -Use Zyrtec 10 mg as needed  Reflux - try use of Zantac 150mg  at bedtime to help reduce reflux symptoms with lying down at night  Follow-up 6 months or sooner

## 2017-08-13 NOTE — Progress Notes (Signed)
Follow-up Note  RE: Shelia Ramos MRN: [id number] DOB: 1971-02-12 Date of Office Visit: 08/13/2017   History of present illness: Shelia Ramos is a 46 y.o. female presenting today for follow-up of asthma, allergies, food allergy.  She was last seen in the office on 09/28/16 by myself.  She states she has been doing well over the past year since last visit without any major health changes.   She does report she has been having a nighttime cough that occurs about 3-4 nights/week.  She does not wake up from sleep with symptoms.  She has not tried use of albuterol to see if would resolve.  She states she has not used her albuterol at all since last visit.  She denies any ED/UC visits or oral steroid needs.   With her allergies she reports it is well controlled with use of as needed zyrtec and flonase however she states she hasn't had significant symptoms to use these medications.   She continues to avoid tree nuts with no accidental ingestions. She has access to an EpiPen. She does endorse heartburn symptoms and has a history of reflux.  She has seen GI late last year and was recommended to trial Zantac as well as recommended use of fiber gummy's and probiotics with hyosycamine for her IBS.  However she states she has never taken Zantac.      Review of systems: Review of Systems  Constitutional: Negative for chills, fever and malaise/fatigue.  HENT: Negative for congestion, ear discharge, ear pain, nosebleeds, sinus pain and sore throat.   Eyes: Negative for discharge and redness.  Respiratory: Positive for cough. Negative for shortness of breath and wheezing.   Cardiovascular: Negative for chest pain.  Gastrointestinal: Negative for abdominal pain, constipation, diarrhea, nausea and vomiting.  Musculoskeletal: Negative for joint pain.  Skin: Negative for itching and rash.  Neurological: Negative for headaches.    All other systems negative unless noted above in HPI  Past  medical/social/surgical/family history have been reviewed and are unchanged unless specifically indicated below.  No changes  Medication List: Allergies as of 08/13/2017   No Known Allergies     Medication List       Accurate as of 08/13/17 10:41 AM. Always use your most recent med list.          albuterol 108 (90 Base) MCG/ACT inhaler Commonly known as:  PROAIR HFA Inhale 2 puffs into the lungs every 4 (four) hours as needed for wheezing or shortness of breath.   benztropine 0.5 MG tablet Commonly known as:  COGENTIN Take 1 tablet (0.5 mg total) by mouth 2 (two) times daily.   Cetirizine HCl 10 MG Caps Take 10 mg by mouth daily as needed.   hydrOXYzine 25 MG tablet Commonly known as:  ATARAX/VISTARIL hydroxyzine HCl 25 mg tablet   mirtazapine 15 MG tablet Commonly known as:  REMERON Take 1 tablet (15 mg total) by mouth at bedtime.   montelukast 5 MG chewable tablet Commonly known as:  SINGULAIR CHEW 2 TABLETS BY MOUTH EVERY EVENING   OSCIMIN 0.125 MG Subl Generic drug:  Hyoscyamine Sulfate SL PLACE 1 TABLET UNDER TONGUE EVERY 6 HOURS AS NEEDED   pantoprazole 20 MG tablet Commonly known as:  PROTONIX Take 1 tablet (20 mg total) by mouth daily.   risperiDONE 3 MG tablet Commonly known as:  RISPERDAL Take 1 tablet (3 mg total) by mouth at bedtime.       Known medication allergies: No Known Allergies  Physical examination: Blood pressure 102/70, pulse 98, resp. rate 17, weight 131 lb 12.8 oz (59.8 kg), SpO2 96 %.  General: Alert, interactive, in no acute distress. HEENT: PERRLA, TMs pearly gray, turbinates minimally edematous without discharge, post-pharynx non erythematous. Neck: Supple without lymphadenopathy. Lungs: Clear to auscultation without wheezing, rhonchi or rales. {no increased work of breathing. CV: Normal S1, S2 without murmurs. Abdomen: Nondistended, nontender. Skin: Warm and dry, without lesions or rashes. Extremities:  No clubbing,  cyanosis or edema. Neuro:   Grossly intact.  Diagnositics/Labs: Spirometry: FEV1: 2.16L  87%, FVC: 2.43L  82%, ratio consistent with nonobstructive pattern ACT 25  Assessment and plan:   Asthma, mod persistent - Cough at night is likely multifactorial with asthma and reflux - will change your Qvar HFA to the Qvar redihaler 2 puffs daily (take at bedtime)  - Continue Singulair 5 mg chewables 2 tabs at bedtime - Continue albuterol inhaler 2 puffs every 4-6 hours as needed for cough or wheeze. Monitor frequency of use -Recommend flu vaccine this season Asthma control goals:   Full participation in all desired activities (may need albuterol before activity)  Albuterol use two time or less a week on average (not counting use with activity)  Cough interfering with sleep two time or less a month  Oral steroids no more than once a year  No hospitalizations  Allergic rhinoconjunctivitis -Use Flonase 1-2 sprays each nostril as needed for runny nose or congestion. Advised to use Flonase for a week or 2 at a time before discontinuing. Demonstrate a proper nasal spray technique -Use Zyrtec 10 mg as needed  Reflux - try use of Zantac 150mg  at bedtime to help reduce reflux symptoms with lying down at night - consider increase head of bed  Follow-up 6 months or sooner  I appreciate the opportunity to take part in Shelia Ramos's care. Please do not hesitate to contact me with questions.  Sincerely,   Margo Aye, MD Allergy/Immunology [facility]

## 2017-08-31 DIAGNOSIS — G4700 Insomnia, unspecified: Secondary | ICD-10-CM | POA: Diagnosis not present

## 2017-08-31 DIAGNOSIS — F411 Generalized anxiety disorder: Secondary | ICD-10-CM | POA: Diagnosis not present

## 2017-08-31 DIAGNOSIS — F319 Bipolar disorder, unspecified: Secondary | ICD-10-CM | POA: Diagnosis not present

## 2017-09-07 MED FILL — risperiDONE 3 MG TABS: 3 | 30 days supply | Qty: 30 | Fill #2

## 2017-09-07 MED FILL — BENZTROPINE MES 0.5 MG TAB: 0.5 | 30 days supply | Qty: 60 | Fill #1

## 2017-09-07 MED FILL — OSCIMIN SL 0.125 MG TABLET: 0.125 | 15 days supply | Qty: 60 | Fill #1

## 2017-09-08 MED FILL — MIRTAZAPINE 15 MG TABLET: 15 | 30 days supply | Qty: 30 | Fill #0

## 2017-09-20 MED FILL — MONTELUKAST SOD 5 MG TAB CH: 5 | 30 days supply | Qty: 60 | Fill #1

## 2017-09-22 DIAGNOSIS — G4700 Insomnia, unspecified: Secondary | ICD-10-CM | POA: Diagnosis not present

## 2017-09-22 DIAGNOSIS — F319 Bipolar disorder, unspecified: Secondary | ICD-10-CM | POA: Diagnosis not present

## 2017-09-22 DIAGNOSIS — F411 Generalized anxiety disorder: Secondary | ICD-10-CM | POA: Diagnosis not present

## 2017-09-30 DIAGNOSIS — F411 Generalized anxiety disorder: Secondary | ICD-10-CM | POA: Diagnosis not present

## 2017-09-30 DIAGNOSIS — F319 Bipolar disorder, unspecified: Secondary | ICD-10-CM | POA: Diagnosis not present

## 2017-09-30 DIAGNOSIS — G4700 Insomnia, unspecified: Secondary | ICD-10-CM | POA: Diagnosis not present

## 2017-10-06 MED FILL — risperiDONE 3 MG TABS: 3 | 30 days supply | Qty: 30 | Fill #0

## 2017-10-12 MED FILL — MIRTAZAPINE 15 MG TABLET: 15 | 30 days supply | Qty: 30 | Fill #1

## 2017-10-20 MED FILL — MONTELUKAST SOD 5 MG TAB CH: 5 | 30 days supply | Qty: 60 | Fill #2

## 2017-11-05 MED FILL — OSCIMIN SL 0.125 MG TABLET: 0.125 | 15 days supply | Qty: 60 | Fill #2

## 2017-11-05 MED FILL — risperiDONE 3 MG TABS: 3 | 30 days supply | Qty: 30 | Fill #1

## 2017-11-05 MED FILL — BENZTROPINE MES 0.5 MG TAB: 0.5 | 30 days supply | Qty: 60 | Fill #2

## 2017-11-10 MED FILL — MIRTAZAPINE 15 MG TAB: 15 | 30 days supply | Qty: 30 | Fill #0

## 2017-11-16 ENCOUNTER — Ambulatory Visit (INDEPENDENT_AMBULATORY_CARE_PROVIDER_SITE_OTHER): Payer: 59

## 2017-11-16 ENCOUNTER — Other Ambulatory Visit: Payer: Self-pay | Admitting: Sports Medicine

## 2017-11-16 ENCOUNTER — Ambulatory Visit: Payer: 59 | Admitting: Sports Medicine

## 2017-11-16 ENCOUNTER — Encounter: Payer: Self-pay | Admitting: Sports Medicine

## 2017-11-16 VITALS — BP 91/63 | HR 115 | Resp 16

## 2017-11-16 DIAGNOSIS — M779 Enthesopathy, unspecified: Secondary | ICD-10-CM

## 2017-11-16 DIAGNOSIS — M25572 Pain in left ankle and joints of left foot: Secondary | ICD-10-CM

## 2017-11-16 DIAGNOSIS — M2011 Hallux valgus (acquired), right foot: Secondary | ICD-10-CM

## 2017-11-16 DIAGNOSIS — M21611 Bunion of right foot: Secondary | ICD-10-CM

## 2017-11-16 NOTE — Patient Instructions (Addendum)
Bunion A bunion is a bump on the base of the big toe that forms when the bones of the big toe joint move out of position. Bunions may be small at first, but they often get larger over time. The can make walking painful. What are the causes? A bunion may be caused by:  Wearing narrow or pointed shoes that force the big toe to press against the other toes.  Abnormal foot development that causes the foot to roll inward (pronate).  Changes in the foot that are caused by certain diseases, such as rheumatoid arthritis and polio.  A foot injury.  What increases the risk? The following factors may make you more likely to develop this condition:  Wearing shoes that squeeze the toes together.  Having certain diseases, such as: ? Rheumatoid arthritis. ? Polio. ? Cerebral palsy.  Having family members who have bunions.  Being born with a foot deformity, such as flat feet or low arches.  Doing activities that put a lot of pressure on the feet, such as ballet dancing.  What are the signs or symptoms? The main symptom of a bunion is a noticeable bump on the big toe. Other symptoms may include:  Pain.  Swelling around the big toe.  Redness and inflammation.  Thick or hardened skin on the big toe or between the toes.  Stiffness or loss of motion in the big toe.  Trouble with walking.  How is this diagnosed? A bunion may be diagnosed based on your symptoms, medical history, and activities. You may have tests, such as:  X-rays. These allow your health care provider to check the position of the bones in your foot and look for damage to your joint. They also help your health care provider to determine the severity of your bunion and the best way to treat it.  Joint aspiration. In this test, a sample of fluid is removed from the toe joint. This test, which may be done if you are in a lot of pain, helps to rule out diseases that cause painful swelling of the joints, such as  arthritis.  How is this treated? There is no cure for a bunion, but treatment can help to prevent a bunion from getting worse. Treatment depends on the severity of your symptoms. Your health care provider may recommend:  Wearing shoes that have a wide toe box.  Using bunion pads to cushion the affected area.  Taping your toes together to keep them in a normal position.  Placing a device inside your shoe (orthotics) to help reduce pressure on your toe joint.  Taking medicine to ease pain, inflammation, and swelling.  Applying heat or ice to the affected area.  Doing stretching exercises.  Surgery to remove scar tissue and move the toes back into their normal position. This treatment is rare.  Follow these instructions at home:  Support your toe joint with proper footwear, shoe padding, or taping as told by your health care provider.  Take over-the-counter and prescription medicines only as told by your health care provider.  If directed, apply ice to the injured area: ? Put ice in a plastic bag. ? Place a towel between your skin and the bag. ? Leave the ice on for 20 minutes, 2-3 times per day.  If directed, apply heat to the affected area before you exercise. Use the heat source that your health care provider recommends, such as a moist heat pack or a heating pad. ? Place a towel between your   skin and the heat source. ? Leave the heat on for 20-30 minutes. ? Remove the heat if your skin turns bright red. This is especially important if you are unable to feel pain, heat, or cold. You may have a greater risk of getting burned.  Do exercises as told by your health care provider.  Keep all follow-up visits as told by your health care provider. Contact a health care provider if:  Your symptoms get worse.  Your symptoms do not improve in 2 weeks. Get help right away if:  You have severe pain and trouble with walking. This information is not intended to replace advice given  to you by your health care provider. Make sure you discuss any questions you have with your health care provider. Document Released: 12/14/2005 Document Revised: 05/21/2016 Document Reviewed: 07/14/2015 Elsevier Interactive Patient Education  2018 Elsevier Inc.  SOFT CAST/ Roland RackUNNA BOOT INSTRUCTIONS  Roland RackUnna boot needs to be worn for 5 days for maximum benefit.  If you next appointment is before 5 days, please remove prior to coming in.  It is important that you wear sturdy shoe or walking boot at all times when walking to protect cast.    **If at any time while wearing the unna boot you should notice any irritation such as a rash, redness, or itching, remove the boot and wash your foot/feet thoroughly.  BATHING INSTRUCTIONS  Keep soft cast clean and dry. If wet, pat dry and blow dry with hair dryer and call office for further instructions.

## 2017-11-16 NOTE — Progress Notes (Signed)
Patient ID: Shelia Ramos, female   DOB: [birth year], 46 y.o.   MRN: [id number] Subjective: Shelia Ramos is a 46 y.o. female patient who returns to office for evaluation of Right> Left bunion pain and for new ankle pain on left pain x 1 month does not remember any injury to the foot or ankle of any sort. Patient states that pain is better but still bothersome. Patient also still complains of continued bunion pain even after trying toe splint and topical rub and injection. States that 1st toe is pressing on 2nd toe and 2nd toe has a bump. Patient denies any other pedal complaints.   Patient Active Problem List   Diagnosis Date Noted  . Bipolar disorder, curr episode depressed, severe, w/psychotic features (HCC) 05/03/2017  . Bunion of great toe of left foot 10/09/2016  . IBS (irritable bowel syndrome) 10/09/2016  . Perimenopause- on Xulane 10/09/2016  . Insomnia disorder 10/09/2016  . Moderate persistent asthma-  09/28/2016  . Allergic rhinoconjunctivitis 09/28/2016  . Dysphagia 09/28/2016  . Constipation 01/03/2015  . Internal bleeding hemorrhoids 01/03/2015  . Anal bleeding 01/03/2015    Current Outpatient Medications on File Prior to Visit  Medication Sig Dispense Refill  . albuterol (PROAIR HFA) 108 (90 Base) MCG/ACT inhaler Inhale 2 puffs into the lungs every 4 (four) hours as needed for wheezing or shortness of breath. 1 Inhaler 1  . beclomethasone (QVAR REDIHALER) 80 MCG/ACT inhaler Inhale 2 puffs into the lungs daily. 1 Inhaler 5  . benztropine (COGENTIN) 0.5 MG tablet Take 1 tablet (0.5 mg total) by mouth 2 (two) times daily. 60 tablet 0  . Cetirizine HCl 10 MG CAPS Take 10 mg by mouth daily as needed.    . hydrOXYzine (ATARAX/VISTARIL) 25 MG tablet hydroxyzine HCl 25 mg tablet    . mirtazapine (REMERON) 15 MG tablet Take 1 tablet (15 mg total) by mouth at bedtime. 30 tablet 0  . montelukast (SINGULAIR) 5 MG chewable tablet CHEW 2 TABLETS BY MOUTH EVERY EVENING 60 tablet 5   . OSCIMIN 0.125 MG SUBL PLACE 1 TABLET UNDER TONGUE EVERY 6 HOURS AS NEEDED 60 each 3  . pantoprazole (PROTONIX) 20 MG tablet Take 1 tablet (20 mg total) by mouth daily. 30 tablet 0  . risperiDONE (RISPERDAL) 3 MG tablet Take 1 tablet (3 mg total) by mouth at bedtime. 30 tablet 0   No current facility-administered medications on file prior to visit.     No Known Allergies  Objective:  General: Alert and oriented x3 in no acute distress  Dermatology: No open lesions bilateral lower extremities, no webspace macerations, no ecchymosis bilateral, all nails x 10 are well manicured.  Vascular: Dorsalis Pedis and Posterior Tibial pedal pulses 2/4, Capillary Fill Time 3 seconds, (+) pedal hair growth bilateral, no edema bilateral lower extremities, Temperature gradient within normal limits.  Neurology: Gross sensation intact via light touch bilateral, Protective sensation intact  with Semmes Weinstein Monofilament to all pedal sites, Position sense intact, vibratory intact bilateral, Deep tendon reflexes within normal limits bilateral, No babinski sign present bilateral. (-) Tinels sign bilateral.  Musculoskeletal: Mild tenderness with palpation to lateral left ankle and to right>left bunion deformity, no limitation or crepitus with range of motion, deformity reducible, tracking not trackbound, there is early crossover of hallux on right, residual mild 1st ray hypermobility noted bilateral. Midtarsal, Subtalar joint, and ankle joint range of motion is within normal limits. On weightbearing exam, forefoot slight abduction with HAV deformity supported on ground with early  second toe crossover deformity noted right>left and hammertoe deformity.   Xray Left ankle- mild joint arthritis and swelling, no acute fracture/dislocation.       Assessment and Plan: Problem List Items Addressed This Visit    None    Visit Diagnoses    Acute left ankle pain    -  Primary   Tendonitis       Hallux valgus with  bunions, right          -Complete examination performed -Xrays reviewed -Discussed plan of care for tendonitis/left ankle pain and treatement options for HAV deformity;conservative and  Surgical management; risks, benefits, alternatives discussed. All patient's questions answered. -Patient opt for continued conservative care at this time -Applied unna boot to left foot/ankle to keep intact for 5 days  -Dispensed bunion foam padding for right bunion   -Continue with [employer] topical -Patient to return to office in 2-3 weeks for follow up care or sooner if condition worsens.  Asencion Islamitorya Iaan Oregel, DPM

## 2017-11-17 DIAGNOSIS — G4700 Insomnia, unspecified: Secondary | ICD-10-CM | POA: Diagnosis not present

## 2017-11-17 DIAGNOSIS — F411 Generalized anxiety disorder: Secondary | ICD-10-CM | POA: Diagnosis not present

## 2017-11-17 DIAGNOSIS — F319 Bipolar disorder, unspecified: Secondary | ICD-10-CM | POA: Diagnosis not present

## 2017-11-23 MED FILL — MONTELUKAST SOD 5 MG TAB CH: 5 | 30 days supply | Qty: 60 | Fill #3

## 2017-11-29 MED FILL — NYSTATIN 100,000 UNITS/GM O: 100000 | 20 days supply | Qty: 15 | Fill #0

## 2017-12-07 ENCOUNTER — Ambulatory Visit: Payer: 59 | Admitting: Sports Medicine

## 2017-12-09 MED FILL — risperiDONE 3 MG TABS: 3 | 30 days supply | Qty: 30 | Fill #2

## 2017-12-13 MED FILL — MIRTAZAPINE 15 MG TAB: 15 | 30 days supply | Qty: 30 | Fill #1

## 2017-12-29 MED FILL — MONTELUKAST SOD 5 MG TAB CH: 5 | 30 days supply | Qty: 60 | Fill #4

## 2018-01-07 ENCOUNTER — Telehealth: Payer: Self-pay | Admitting: *Deleted

## 2018-01-07 ENCOUNTER — Telehealth: Payer: Self-pay | Admitting: Family Medicine

## 2018-01-07 NOTE — Telephone Encounter (Signed)
"  I'm a patient of Dr. Marylene LandStover.  I'd like to schedule surgery."  Have you signed consent forms?  "No, I have not."  You will need to see Dr. Marylene LandStover for a consultation then we can get you set up for surgery when you come in.  Would you like me to send you to a scheduler?  "Yes, that would be great.  I have another question, I have that new insurance, [employer] plan.  Will you check and see if it needs authorization?"  Yes, we will check to see if authorization is needed.  [facility] for her to make an appointment

## 2018-01-07 NOTE — Telephone Encounter (Signed)
Patient called states she has an appointment w / Dr. Marylene LandStover of [facility] on 01/17/18 for bunion removal.   ----- Patient is on 's new insurance ([employer]) which requires patient / insured to obtain referral/ authorization for all non emergency procedures/surgeries.   --- Patient wishes provider to issue a written referral to be faxed to [phone] to her attn so it can be forward to [employer]  --glh

## 2018-01-10 ENCOUNTER — Other Ambulatory Visit: Payer: Self-pay

## 2018-01-10 DIAGNOSIS — M21619 Bunion of unspecified foot: Secondary | ICD-10-CM

## 2018-01-10 MED FILL — OSCIMIN SL 0.125 MG TABLET: 0.125 | 15 days supply | Qty: 60 | Fill #3

## 2018-01-10 NOTE — Telephone Encounter (Signed)
Referral was placed in the chart for the patient. Patient notified. MPulliam, CMA/RT(R)

## 2018-01-10 NOTE — Telephone Encounter (Signed)
That's fine

## 2018-01-10 NOTE — Telephone Encounter (Signed)
Patient was last seen in office 10/09/2016.  Please advise if you are okay with placing the referral. MPulliam, CMA/RT(R)

## 2018-01-14 MED FILL — risperiDONE 3 MG TABS: 3 | 30 days supply | Qty: 30 | Fill #3

## 2018-01-14 MED FILL — MIRTAZAPINE 15 MG TAB: 15 | 30 days supply | Qty: 30 | Fill #2

## 2018-01-18 ENCOUNTER — Encounter: Payer: Self-pay | Admitting: Sports Medicine

## 2018-01-18 ENCOUNTER — Ambulatory Visit (INDEPENDENT_AMBULATORY_CARE_PROVIDER_SITE_OTHER): Payer: No Typology Code available for payment source

## 2018-01-18 ENCOUNTER — Ambulatory Visit (INDEPENDENT_AMBULATORY_CARE_PROVIDER_SITE_OTHER): Payer: No Typology Code available for payment source | Admitting: Sports Medicine

## 2018-01-18 DIAGNOSIS — M779 Enthesopathy, unspecified: Secondary | ICD-10-CM

## 2018-01-18 DIAGNOSIS — Q738 Other reduction defects of unspecified limb(s): Secondary | ICD-10-CM | POA: Diagnosis not present

## 2018-01-18 DIAGNOSIS — M2011 Hallux valgus (acquired), right foot: Secondary | ICD-10-CM

## 2018-01-18 DIAGNOSIS — M79671 Pain in right foot: Secondary | ICD-10-CM | POA: Diagnosis not present

## 2018-01-18 DIAGNOSIS — M7741 Metatarsalgia, right foot: Secondary | ICD-10-CM | POA: Diagnosis not present

## 2018-01-18 DIAGNOSIS — Q72899 Other reduction defects of unspecified lower limb: Secondary | ICD-10-CM

## 2018-01-18 NOTE — Progress Notes (Signed)
Patient ID: ARCADIA GORGAS, female   DOB: 03-15-1971, 47 y.o.   MRN: [id number] Subjective: TRANIKA SCHOLLER is a 47 y.o. female patient who returns to office for evaluation of Right> Left bunion pain. Patient complains of continued bunion pain; desires to discuss surgery. Patient denies any other pedal complaints.   Patient Active Problem List   Diagnosis Date Noted  . Bipolar disorder, curr episode depressed, severe, w/psychotic features ([facility]) 05/03/2017  . Bunion of great toe of left foot 10/09/2016  . IBS (irritable bowel syndrome) 10/09/2016  . Perimenopause- on Xulane 10/09/2016  . Insomnia disorder 10/09/2016  . Moderate persistent asthma-  09/28/2016  . Allergic rhinoconjunctivitis 09/28/2016  . Dysphagia 09/28/2016  . Constipation 01/03/2015  . Internal bleeding hemorrhoids 01/03/2015  . Anal bleeding 01/03/2015    Current Outpatient Medications on File Prior to Visit  Medication Sig Dispense Refill  . albuterol (PROAIR HFA) 108 (90 Base) MCG/ACT inhaler Inhale 2 puffs into the lungs every 4 (four) hours as needed for wheezing or shortness of breath. 1 Inhaler 1  . beclomethasone (QVAR REDIHALER) 80 MCG/ACT inhaler Inhale 2 puffs into the lungs daily. 1 Inhaler 5  . benztropine (COGENTIN) 0.5 MG tablet Take 1 tablet (0.5 mg total) by mouth 2 (two) times daily. 60 tablet 0  . Cetirizine HCl 10 MG CAPS Take 10 mg by mouth daily as needed.    . hydrOXYzine (ATARAX/VISTARIL) 25 MG tablet hydroxyzine HCl 25 mg tablet    . mirtazapine (REMERON) 15 MG tablet Take 1 tablet (15 mg total) by mouth at bedtime. 30 tablet 0  . montelukast (SINGULAIR) 5 MG chewable tablet CHEW 2 TABLETS BY MOUTH EVERY EVENING 60 tablet 5  . OSCIMIN 0.125 MG SUBL PLACE 1 TABLET UNDER TONGUE EVERY 6 HOURS AS NEEDED 60 each 3  . pantoprazole (PROTONIX) 20 MG tablet Take 1 tablet (20 mg total) by mouth daily. 30 tablet 0  . risperiDONE (RISPERDAL) 3 MG tablet Take 1 tablet (3 mg total) by mouth at bedtime.  30 tablet 0   No current facility-administered medications on file prior to visit.     No Known Allergies   Family History  Problem Relation Age of Onset  . Anxiety disorder Mother   . Depression Mother   . Allergic rhinitis Father   . Allergic rhinitis Paternal Aunt   . Allergic rhinitis Paternal Grandmother   . Cancer Paternal Grandmother        stomach  . Heart attack Maternal Grandfather   . Angioedema Neg Hx   . Asthma Neg Hx   . Atopy Neg Hx   . Eczema Neg Hx   . Immunodeficiency Neg Hx   . Urticaria Neg Hx    Social History   Socioeconomic History  . Marital status: Married    Spouse name: Not on file  . Number of children: Not on file  . Years of education: Not on file  . Highest education level: Not on file  Social Needs  . Financial resource strain: Not on file  . Food insecurity - worry: Not on file  . Food insecurity - inability: Not on file  . Transportation needs - medical: Not on file  . Transportation needs - non-medical: Not on file  Occupational History  . Not on file  Tobacco Use  . Smoking status: Never Smoker  . Smokeless tobacco: Never Used  Substance and Sexual Activity  . Alcohol use: No    Alcohol/week: 0.0 oz  . Drug use:  No  . Sexual activity: Yes    Birth control/protection: Patch  Other Topics Concern  . Not on file  Social History Narrative  . Not on file   Past Surgical History:  Procedure Laterality Date  . BUNIONECTOMY Right   . COLPOSCOPY      Objective:  General: Alert and oriented x3 in no acute distress  Dermatology: No open lesions bilateral lower extremities, no webspace macerations, no ecchymosis bilateral, all nails x 10 are well manicured.  Vascular: Dorsalis Pedis and Posterior Tibial pedal pulses 2/4, Capillary Fill Time 3 seconds, (+) pedal hair growth bilateral, no edema bilateral lower extremities, Temperature gradient within normal limits.  Neurology: Gross sensation intact via light touch bilateral,  Protective sensation intact  with Semmes Weinstein Monofilament to all pedal sites, Position sense intact, vibratory intact bilateral, Deep tendon reflexes within normal limits bilateral, No babinski sign present bilateral. (-) Tinels sign bilateral.  Musculoskeletal: Mild tenderness with palpation right>left bunion deformity, no limitation or crepitus with range of motion, deformity reducible, tracking not trackbound, there is early crossover of hallux on right, residual mild 1st ray hypermobility noted bilateral. Midtarsal, Subtalar joint, and ankle joint range of motion is within normal limits. On weightbearing exam, forefoot slight abduction with HAV deformity supported on ground with early second toe crossover deformity noted right>left and hammertoe deformity.       Assessment and Plan: Problem List Items Addressed This Visit    None    Visit Diagnoses    Hav (hallux abducto valgus), right    -  Primary   Relevant Orders   DG Foot Complete Right (Completed)   Capsulitis       Right foot pain       Metatarsalgia of right foot       Longitudinal deficiency of metatarsal bone          -Complete examination performed - Xrays reviewed -Discussed treatement options; discussed HAV deformity with metatarsalagia;conservative and  Surgical management; risks, benefits, alternatives discussed. All patient's questions answered. -Patient opt for surgical management. Consent obtained for Right bunionectomy [place], akin, and possible second met osteotomy. Pre and Post op course explained. Risks, benefits, alternatives explained. No guarantees given or implied. Surgical booking slip submitted and provided patient with Surgical packet and info for [place].  -Dispensed CAM Walker to use post op -Patient to return to office after surgery or sooner if condition worsens.  Landis Martins, DPM

## 2018-01-18 NOTE — Patient Instructions (Signed)
Pre-Operative Instructions  Congratulations, you have decided to take an important step towards improving your quality of life.  You can be assured that the doctors and staff at [facility] will be with you every step of the way.  Here are some important things you should know:  1. Plan to be at the surgery center/hospital at least 1 (one) hour prior to your scheduled time, unless otherwise directed by the surgical center/hospital staff.  You must have a responsible adult accompany you, remain during the surgery and drive you home.  Make sure you have directions to the surgical center/hospital to ensure you arrive on time. 2. If you are having surgery at [facility] or [facility] hospitals, you will need a copy of your medical history and physical form from your family physician within one month prior to the date of surgery. We will give you a form for your primary physician to complete.  3. We make every effort to accommodate the date you request for surgery.  However, there are times where surgery dates or times have to be moved.  We will contact you as soon as possible if a change in schedule is required.   4. No aspirin/ibuprofen for one week before surgery.  If you are on aspirin, any non-steroidal anti-inflammatory medications (Mobic, Aleve, Ibuprofen) should not be taken seven (7) days prior to your surgery.  You make take Tylenol for pain prior to surgery.  5. Medications - If you are taking daily heart and blood pressure medications, seizure, reflux, allergy, asthma, anxiety, pain or diabetes medications, make sure you notify the surgery center/hospital before the day of surgery so they can tell you which medications you should take or avoid the day of surgery. 6. No food or drink after midnight the night before surgery unless directed otherwise by surgical center/hospital staff. 7. No alcoholic beverages 24-hours prior to surgery.  No smoking 24-hours prior or 24-hours after  surgery. 8. Wear loose pants or shorts. They should be loose enough to fit over bandages, boots, and casts. 9. Don't wear slip-on shoes. Sneakers are preferred. 10. Bring your boot with you to the surgery center/hospital.  Also bring crutches or a walker if your physician has prescribed it for you.  If you do not have this equipment, it will be provided for you after surgery. 11. If you have not been contacted by the surgery center/hospital by the day before your surgery, call to confirm the date and time of your surgery. 12. Leave-time from work may vary depending on the type of surgery you have.  Appropriate arrangements should be made prior to surgery with your employer. 13. Prescriptions will be provided immediately following surgery by your doctor.  Fill these as soon as possible after surgery and take the medication as directed. Pain medications will not be refilled on weekends and must be approved by the doctor. 14. Remove nail polish on the operative foot and avoid getting pedicures prior to surgery. 15. Wash the night before surgery.  The night before surgery wash the foot and leg well with water and the antibacterial soap provided. Be sure to pay special attention to beneath the toenails and in between the toes.  Wash for at least three (3) minutes. Rinse thoroughly with water and dry well with a towel.  Perform this wash unless told not to do so by your physician.  Enclosed: 1 Ice pack (please put in freezer the night before surgery)   1 Hibiclens skin cleaner     Pre-op instructions  If you have any questions regarding the instructions, please do not hesitate to call our office.  [place]: [address], [place], [place] [address] -- [phone]  [place]: [address] [place], [place] [address] -- [phone]  [place]: [address]  [place], [place] [address] -- [phone]  [place]: [address], [address], [place], [place] [address] -- [phone]  Website:  [email or web address] 

## 2018-01-31 MED FILL — MONTELUKAST SOD 5 MG TAB CH: 5 | 30 days supply | Qty: 60 | Fill #5

## 2018-02-03 MED FILL — BENZTROPINE MES 0.5 MG TAB: 0.5 | 30 days supply | Qty: 60 | Fill #0

## 2018-02-11 MED FILL — MIRTAZAPINE 15 MG TAB: 15 | 30 days supply | Qty: 30 | Fill #0

## 2018-02-11 MED FILL — risperiDONE 3 MG TABS: 3 | 30 days supply | Qty: 30 | Fill #0

## 2018-02-18 ENCOUNTER — Telehealth: Payer: Self-pay | Admitting: *Deleted

## 2018-02-18 NOTE — Telephone Encounter (Signed)
"  Have you heard from patient's insurance company about her authorization scheduled for Monday?"  I have not I will call them.  I sent the clinicals to them yesterday upon their request.  "Okay let me know because they are trying to work out the schedule for Monday."  I will.  I called [employer] and spoke to the [place], Charity fundraiserN.  She asked some questions that I answered.  She said the surgery was approved.  The authorization number is [id number].  I called and left a message for Aram BeechamCynthia regarding the authorization.

## 2018-02-21 ENCOUNTER — Encounter: Payer: Self-pay | Admitting: Sports Medicine

## 2018-02-21 DIAGNOSIS — M2041 Other hammer toe(s) (acquired), right foot: Secondary | ICD-10-CM | POA: Diagnosis not present

## 2018-02-21 DIAGNOSIS — M21541 Acquired clubfoot, right foot: Secondary | ICD-10-CM | POA: Diagnosis not present

## 2018-02-21 DIAGNOSIS — M2011 Hallux valgus (acquired), right foot: Secondary | ICD-10-CM | POA: Diagnosis not present

## 2018-02-21 MED FILL — DOK 100 MG SOFTGEL: 100 | 50 days supply | Qty: 100 | Fill #0

## 2018-02-21 MED FILL — HYDROCODON-APAP 10-325: 10-325 | 6 days supply | Qty: 21 | Fill #0

## 2018-02-21 MED FILL — PROMETHAZINE 25 MG TABLET: 25 | 10 days supply | Qty: 30 | Fill #0

## 2018-02-22 ENCOUNTER — Telehealth: Payer: Self-pay | Admitting: Sports Medicine

## 2018-02-22 NOTE — Telephone Encounter (Signed)
Post op check phone call made to patient. Patient reports that she is doing good. No pain. Her foot is still asleep and that she did not have any issues overnight. I reminded patient to ice, elevate, and to start pain medication in preparation for when her foot "wakes back up". Patient expressed understanding and is to follow up on next week for continued post op care. -Dr. Marylene LandStover

## 2018-03-01 ENCOUNTER — Ambulatory Visit (INDEPENDENT_AMBULATORY_CARE_PROVIDER_SITE_OTHER): Payer: No Typology Code available for payment source | Admitting: Sports Medicine

## 2018-03-01 ENCOUNTER — Ambulatory Visit (INDEPENDENT_AMBULATORY_CARE_PROVIDER_SITE_OTHER): Payer: No Typology Code available for payment source

## 2018-03-01 ENCOUNTER — Encounter: Payer: Self-pay | Admitting: Sports Medicine

## 2018-03-01 VITALS — BP 94/49 | HR 112 | Temp 98.2°F | Resp 16

## 2018-03-01 DIAGNOSIS — M7741 Metatarsalgia, right foot: Secondary | ICD-10-CM

## 2018-03-01 DIAGNOSIS — Q738 Other reduction defects of unspecified limb(s): Secondary | ICD-10-CM

## 2018-03-01 DIAGNOSIS — M2011 Hallux valgus (acquired), right foot: Secondary | ICD-10-CM

## 2018-03-01 DIAGNOSIS — M779 Enthesopathy, unspecified: Secondary | ICD-10-CM

## 2018-03-01 DIAGNOSIS — Z9889 Other specified postprocedural states: Secondary | ICD-10-CM

## 2018-03-01 DIAGNOSIS — M79671 Pain in right foot: Secondary | ICD-10-CM

## 2018-03-01 DIAGNOSIS — Q72899 Other reduction defects of unspecified lower limb: Secondary | ICD-10-CM

## 2018-03-01 NOTE — Progress Notes (Signed)
Subjective: JESSIECA RHEM is a 47 y.o. female patient seen today in office for POV #1 (DOS 02-21-18), S/P R Austin Akin Bunionectomy and 2nd metatarsal osteotomy. Patient denies pain at surgical site at most was 2/10 on Sunday when taking a shower with shower protector, denies calf pain, denies headache, chest pain, shortness of breath, nausea, vomiting, fever, or chills. No other issues noted.   Patient Active Problem List   Diagnosis Date Noted  . Bipolar disorder, curr episode depressed, severe, w/psychotic features (HCC) 05/03/2017  . Bunion of great toe of left foot 10/09/2016  . IBS (irritable bowel syndrome) 10/09/2016  . Perimenopause- on Xulane 10/09/2016  . Insomnia disorder 10/09/2016  . Moderate persistent asthma-  09/28/2016  . Allergic rhinoconjunctivitis 09/28/2016  . Dysphagia 09/28/2016  . Constipation 01/03/2015  . Internal bleeding hemorrhoids 01/03/2015  . Anal bleeding 01/03/2015    Current Outpatient Medications on File Prior to Visit  Medication Sig Dispense Refill  . albuterol (PROAIR HFA) 108 (90 Base) MCG/ACT inhaler Inhale 2 puffs into the lungs every 4 (four) hours as needed for wheezing or shortness of breath. 1 Inhaler 1  . beclomethasone (QVAR REDIHALER) 80 MCG/ACT inhaler Inhale 2 puffs into the lungs daily. 1 Inhaler 5  . benztropine (COGENTIN) 0.5 MG tablet Take 1 tablet (0.5 mg total) by mouth 2 (two) times daily. 60 tablet 0  . Cetirizine HCl 10 MG CAPS Take 10 mg by mouth daily as needed.    Marland Kitchen HYDROcodone-acetaminophen (NORCO) 10-325 MG tablet   0  . hydrOXYzine (ATARAX/VISTARIL) 25 MG tablet hydroxyzine HCl 25 mg tablet    . mirtazapine (REMERON) 15 MG tablet Take 1 tablet (15 mg total) by mouth at bedtime. 30 tablet 0  . montelukast (SINGULAIR) 5 MG chewable tablet CHEW 2 TABLETS BY MOUTH EVERY EVENING 60 tablet 5  . OSCIMIN 0.125 MG SUBL PLACE 1 TABLET UNDER TONGUE EVERY 6 HOURS AS NEEDED 60 each 3  . pantoprazole (PROTONIX) 20 MG tablet  Take 1 tablet (20 mg total) by mouth daily. 30 tablet 0  . risperiDONE (RISPERDAL) 3 MG tablet Take 1 tablet (3 mg total) by mouth at bedtime. 30 tablet 0   No current facility-administered medications on file prior to visit.     No Known Allergies  Objective: There were no vitals filed for this visit.  General: No acute distress, AAOx3  Right foot: Sutures intact with no gapping or dehiscence at surgical site, mild swelling to right foot, +ecchymosis, no erythema, no warmth, no drainage, no signs of infection noted, Capillary fill time <3 seconds in all digits, gross sensation present via light touch to right foot. No pain or crepitation with range of motion right foot.  No pain with calf compression.   Post Op Xray, Right foot: Excellent alignment and position. Osteotomy sites healing. Hardware intact. Soft tissue swelling within normal limits for post op status.   Assessment and Plan:  Problem List Items Addressed This Visit    None    Visit Diagnoses    Hallux valgus of right foot    -  Primary   Relevant Orders   DG Foot Complete Right (Completed)   Metatarsalgia of right foot       Longitudinal deficiency of metatarsal bone       Capsulitis       Right foot pain       S/P foot surgery, right           -Patient seen and evaluated -Xrays  reviewed  -Applied dry sterile dressing to surgical site right foot secured with ACE wrap and stockinet  -Advised patient to make sure to keep dressings clean, dry, and intact to right surgical site, removing the ACE as needed  -Advised patient to continue with Crutches and NWB to right foot; Advised patient that is brusing it better may allow weightbearing with boot at next visit  -Advised patient to limit activity to necessity  -Advised patient to ice and elevate daily to help bruses go away  -Will plan for suture removal and possibly allowing weightbearing with boot at next office visit. In the meantime, patient to call office if any  issues or problems arise.   Asencion Islamitorya Jamia Hoban, DPM

## 2018-03-04 NOTE — Progress Notes (Signed)
DOS 02/21/18 Rt bunionectomy with internal fixation and possible 2nd metatarsal osteotomy with internal fixation

## 2018-03-08 ENCOUNTER — Encounter: Payer: No Typology Code available for payment source | Admitting: Sports Medicine

## 2018-03-08 ENCOUNTER — Ambulatory Visit (INDEPENDENT_AMBULATORY_CARE_PROVIDER_SITE_OTHER): Payer: Self-pay

## 2018-03-08 DIAGNOSIS — M2011 Hallux valgus (acquired), right foot: Secondary | ICD-10-CM

## 2018-03-08 DIAGNOSIS — M7741 Metatarsalgia, right foot: Secondary | ICD-10-CM

## 2018-03-09 NOTE — Progress Notes (Signed)
Patient presents s/p DOS 02/21/18 Rt bunionectomy with internal fixation and possible 2nd metatarsal osteotomy with internal fixation. She states that her foot does not hurt her very much, but she does admit to being on it more and she currently has had to go back to work.   Noted well healing surgical foot. No erythema, drainage, mild swelling.   Redressed in dry sterile dressing with compression, suture ends were cut. She is allowed to get the area wet, but advised to not soak her foot. She is to remain in the boot and follow up with Dr Marylene LandStover in 1 week or sooner if needed

## 2018-03-15 ENCOUNTER — Ambulatory Visit (INDEPENDENT_AMBULATORY_CARE_PROVIDER_SITE_OTHER): Payer: No Typology Code available for payment source

## 2018-03-15 ENCOUNTER — Ambulatory Visit (INDEPENDENT_AMBULATORY_CARE_PROVIDER_SITE_OTHER): Payer: No Typology Code available for payment source | Admitting: Sports Medicine

## 2018-03-15 ENCOUNTER — Encounter: Payer: Self-pay | Admitting: Sports Medicine

## 2018-03-15 DIAGNOSIS — Z9889 Other specified postprocedural states: Secondary | ICD-10-CM | POA: Diagnosis not present

## 2018-03-15 DIAGNOSIS — Q738 Other reduction defects of unspecified limb(s): Secondary | ICD-10-CM | POA: Diagnosis not present

## 2018-03-15 DIAGNOSIS — M2011 Hallux valgus (acquired), right foot: Secondary | ICD-10-CM | POA: Diagnosis not present

## 2018-03-15 DIAGNOSIS — Q72899 Other reduction defects of unspecified lower limb: Secondary | ICD-10-CM

## 2018-03-15 DIAGNOSIS — M779 Enthesopathy, unspecified: Secondary | ICD-10-CM | POA: Diagnosis not present

## 2018-03-15 DIAGNOSIS — M7741 Metatarsalgia, right foot: Secondary | ICD-10-CM

## 2018-03-15 DIAGNOSIS — M79671 Pain in right foot: Secondary | ICD-10-CM | POA: Diagnosis not present

## 2018-03-15 NOTE — Progress Notes (Signed)
Subjective: Shelia Ramos is a 47 y.o. female patient seen today in office for POV #3 (DOS 02-21-18), S/P R [place] Akin Bunionectomy and 2nd metatarsal osteotomy. Patient denies pain at surgical site except when driving and noticing foot a little swollen; patient states that she is taking her CAM boot off to drive and has been back at work, denies calf pain, denies headache, chest pain, shortness of breath, nausea, vomiting, fever, or chills. No other issues noted.   Patient Active Problem List   Diagnosis Date Noted  . Bipolar disorder, curr episode depressed, severe, w/psychotic features (HCC) 05/03/2017  . Bunion of great toe of left foot 10/09/2016  . IBS (irritable bowel syndrome) 10/09/2016  . Perimenopause- on Xulane 10/09/2016  . Insomnia disorder 10/09/2016  . Moderate persistent asthma-  09/28/2016  . Allergic rhinoconjunctivitis 09/28/2016  . Dysphagia 09/28/2016  . Constipation 01/03/2015  . Internal bleeding hemorrhoids 01/03/2015  . Anal bleeding 01/03/2015    Current Outpatient Medications on File Prior to Visit  Medication Sig Dispense Refill  . albuterol (PROAIR HFA) 108 (90 Base) MCG/ACT inhaler Inhale 2 puffs into the lungs every 4 (four) hours as needed for wheezing or shortness of breath. 1 Inhaler 1  . beclomethasone (QVAR REDIHALER) 80 MCG/ACT inhaler Inhale 2 puffs into the lungs daily. 1 Inhaler 5  . benztropine (COGENTIN) 0.5 MG tablet Take 1 tablet (0.5 mg total) by mouth 2 (two) times daily. 60 tablet 0  . Cetirizine HCl 10 MG CAPS Take 10 mg by mouth daily as needed.    Marland Kitchen HYDROcodone-acetaminophen (NORCO) 10-325 MG tablet   0  . hydrOXYzine (ATARAX/VISTARIL) 25 MG tablet hydroxyzine HCl 25 mg tablet    . mirtazapine (REMERON) 15 MG tablet Take 1 tablet (15 mg total) by mouth at bedtime. 30 tablet 0  . montelukast (SINGULAIR) 5 MG chewable tablet CHEW 2 TABLETS BY MOUTH EVERY EVENING 60 tablet 5  . OSCIMIN 0.125 MG SUBL PLACE 1 TABLET UNDER TONGUE EVERY  6 HOURS AS NEEDED 60 each 3  . pantoprazole (PROTONIX) 20 MG tablet Take 1 tablet (20 mg total) by mouth daily. 30 tablet 0  . risperiDONE (RISPERDAL) 3 MG tablet Take 1 tablet (3 mg total) by mouth at bedtime. 30 tablet 0   No current facility-administered medications on file prior to visit.     No Known Allergies  Objective: There were no vitals filed for this visit.  General: No acute distress, AAOx3  Right foot: Incision well healed at surgical site, mild swelling to right foot, no erythema, no warmth, no drainage, no signs of infection noted, Capillary fill time <3 seconds in all digits, gross sensation present via light touch to right foot. No pain or crepitation with range of motion right foot.  No pain with calf compression.   Post Op Xray, Right foot: Excellent alignment and position. Osteotomy sites healing. Hardware intact. Soft tissue swelling within normal limits for post op status. No other acute findings.   Assessment and Plan:  Problem List Items Addressed This Visit    None    Visit Diagnoses    Hallux valgus of right foot    -  Primary   Relevant Orders   DG Foot Complete Right (Completed)   Metatarsalgia of right foot       Longitudinal deficiency of metatarsal bone       Capsulitis       Right foot pain       S/P foot surgery, right           -  Patient seen and evaluated -Xrays reviewed  -Continue with CAM boot for long distance and in house or shorter distance to use post op shoe with protected weightbearing  -Advised patient to limit activity to necessity and NO DRIVING WITH SURGICAL SHOE OR BOOT -Dispensed compression sleeve to help with swelling  -May use scar creams to incision sites  -Advised patient to ice and elevate daily as previous   -Will plan for increasing weightbearing to spend more time in post op shoe and slowly transition to tennis shoe at next office visit. In the meantime, patient to call office if any issues or problems arise.    Asencion Islamitorya Tiara Bartoli, DPM

## 2018-03-18 ENCOUNTER — Other Ambulatory Visit: Payer: Self-pay | Admitting: Gastroenterology

## 2018-03-18 MED FILL — MIRTAZAPINE 15 MG TABLET: 15 | 30 days supply | Qty: 30 | Fill #1

## 2018-03-18 MED FILL — risperiDONE 3 MG TABS: 3 | 30 days supply | Qty: 30 | Fill #1

## 2018-03-29 ENCOUNTER — Ambulatory Visit (INDEPENDENT_AMBULATORY_CARE_PROVIDER_SITE_OTHER): Payer: No Typology Code available for payment source | Admitting: Sports Medicine

## 2018-03-29 ENCOUNTER — Encounter: Payer: Self-pay | Admitting: Sports Medicine

## 2018-03-29 DIAGNOSIS — M79671 Pain in right foot: Secondary | ICD-10-CM

## 2018-03-29 DIAGNOSIS — M7741 Metatarsalgia, right foot: Secondary | ICD-10-CM

## 2018-03-29 DIAGNOSIS — Q738 Other reduction defects of unspecified limb(s): Secondary | ICD-10-CM

## 2018-03-29 DIAGNOSIS — M779 Enthesopathy, unspecified: Secondary | ICD-10-CM

## 2018-03-29 DIAGNOSIS — M2011 Hallux valgus (acquired), right foot: Secondary | ICD-10-CM

## 2018-03-29 DIAGNOSIS — Q72899 Other reduction defects of unspecified lower limb: Secondary | ICD-10-CM

## 2018-03-29 NOTE — Progress Notes (Signed)
Subjective: Shelia Ramos is a 47 y.o. female patient seen today in office for POV #4 (DOS 02-21-18), S/P R Austin Akin Bunionectomy and 2nd metatarsal osteotomy. Patient denies pain at surgical site, states  She has a little swelling but no pain, denies calf pain, denies headache, chest pain, shortness of breath, nausea, vomiting, fever, or chills. No other issues noted.   Patient Active Problem List   Diagnosis Date Noted  . Bipolar disorder, curr episode depressed, severe, w/psychotic features (HCC) 05/03/2017  . Bunion of great toe of left foot 10/09/2016  . IBS (irritable bowel syndrome) 10/09/2016  . Perimenopause- on Xulane 10/09/2016  . Insomnia disorder 10/09/2016  . Moderate persistent asthma-  09/28/2016  . Allergic rhinoconjunctivitis 09/28/2016  . Dysphagia 09/28/2016  . Constipation 01/03/2015  . Internal bleeding hemorrhoids 01/03/2015  . Anal bleeding 01/03/2015    Current Outpatient Medications on File Prior to Visit  Medication Sig Dispense Refill  . albuterol (PROAIR HFA) 108 (90 Base) MCG/ACT inhaler Inhale 2 puffs into the lungs every 4 (four) hours as needed for wheezing or shortness of breath. 1 Inhaler 1  . beclomethasone (QVAR REDIHALER) 80 MCG/ACT inhaler Inhale 2 puffs into the lungs daily. 1 Inhaler 5  . benztropine (COGENTIN) 0.5 MG tablet Take 1 tablet (0.5 mg total) by mouth 2 (two) times daily. 60 tablet 0  . Cetirizine HCl 10 MG CAPS Take 10 mg by mouth daily as needed.    Marland Kitchen. HYDROcodone-acetaminophen (NORCO) 10-325 MG tablet   0  . hydrOXYzine (ATARAX/VISTARIL) 25 MG tablet hydroxyzine HCl 25 mg tablet    . mirtazapine (REMERON) 15 MG tablet Take 1 tablet (15 mg total) by mouth at bedtime. 30 tablet 0  . montelukast (SINGULAIR) 5 MG chewable tablet CHEW 2 TABLETS BY MOUTH EVERY EVENING 60 tablet 5  . OSCIMIN 0.125 MG SUBL PLACE 1 TABLET UNDER TONGUE EVERY 6 HOURS AS NEEDED 60 each 3  . pantoprazole (PROTONIX) 20 MG tablet Take 1 tablet (20 mg total)  by mouth daily. 30 tablet 0  . risperiDONE (RISPERDAL) 3 MG tablet Take 1 tablet (3 mg total) by mouth at bedtime. 30 tablet 0   No current facility-administered medications on file prior to visit.     No Known Allergies  Objective: There were no vitals filed for this visit.  General: No acute distress, AAOx3  Right foot: Incisions well healed at surgical sites, mild swelling to right foot, no erythema, no warmth, no drainage, no signs of infection noted, Capillary fill time <3 seconds in all digits, gross sensation present via light touch to right foot. No pain or crepitation with range of motion right foot.  No pain with calf compression.   Assessment and Plan:  Problem List Items Addressed This Visit    None    Visit Diagnoses    Hallux valgus of right foot    -  Primary   Metatarsalgia of right foot       Longitudinal deficiency of metatarsal bone       Capsulitis       Right foot pain           -Patient seen and evaluated -Patient may slowly transition to tennis shoe as directed  -Continue with compression sleeve to help with swelling  -May continue with scar creams to incision sites  -Advised patient to ice and elevate daily as previous   -Will plan for xrays and increasing activities at next office visit. In the meantime, patient to call  office if any issues or problems arise.   Landis Martins, DPM

## 2018-04-05 MED FILL — BENZTROPINE MES 0.5 MG TAB: 0.5 | 30 days supply | Qty: 60 | Fill #1

## 2018-04-18 MED FILL — MIRTAZAPINE 15 MG TABLET: 15 | 30 days supply | Qty: 30 | Fill #2

## 2018-04-18 MED FILL — risperiDONE 3 MG TABS: 3 | 30 days supply | Qty: 30 | Fill #2

## 2018-04-26 ENCOUNTER — Encounter: Payer: No Typology Code available for payment source | Admitting: Sports Medicine

## 2018-05-10 ENCOUNTER — Ambulatory Visit (INDEPENDENT_AMBULATORY_CARE_PROVIDER_SITE_OTHER): Payer: No Typology Code available for payment source

## 2018-05-10 ENCOUNTER — Ambulatory Visit (INDEPENDENT_AMBULATORY_CARE_PROVIDER_SITE_OTHER): Payer: No Typology Code available for payment source | Admitting: Sports Medicine

## 2018-05-10 ENCOUNTER — Encounter: Payer: Self-pay | Admitting: Sports Medicine

## 2018-05-10 DIAGNOSIS — M779 Enthesopathy, unspecified: Secondary | ICD-10-CM

## 2018-05-10 DIAGNOSIS — Q72899 Other reduction defects of unspecified lower limb: Secondary | ICD-10-CM

## 2018-05-10 DIAGNOSIS — M2011 Hallux valgus (acquired), right foot: Secondary | ICD-10-CM

## 2018-05-10 DIAGNOSIS — Z9889 Other specified postprocedural states: Secondary | ICD-10-CM

## 2018-05-10 DIAGNOSIS — Q738 Other reduction defects of unspecified limb(s): Secondary | ICD-10-CM

## 2018-05-10 DIAGNOSIS — M7741 Metatarsalgia, right foot: Secondary | ICD-10-CM

## 2018-05-10 DIAGNOSIS — M79671 Pain in right foot: Secondary | ICD-10-CM

## 2018-05-10 NOTE — Progress Notes (Signed)
Subjective: Shelia Ramos is a 47 y.o. female patient seen today in office for POV #5 (DOS 02-21-18), S/P R Austin Akin Bunionectomy and 2nd metatarsal osteotomy. Patient denies pain at surgical site, states that she is doing well with no pain.  Patient denies calf pain, denies headache, chest pain, shortness of breath, nausea, vomiting, fever, or chills. No other issues noted.   Patient Active Problem List   Diagnosis Date Noted  . Bipolar disorder, curr episode depressed, severe, w/psychotic features (HCC) 05/03/2017  . Bunion of great toe of left foot 10/09/2016  . IBS (irritable bowel syndrome) 10/09/2016  . Perimenopause- on Xulane 10/09/2016  . Insomnia disorder 10/09/2016  . Moderate persistent asthma-  09/28/2016  . Allergic rhinoconjunctivitis 09/28/2016  . Dysphagia 09/28/2016  . Constipation 01/03/2015  . Internal bleeding hemorrhoids 01/03/2015  . Anal bleeding 01/03/2015    Current Outpatient Medications on File Prior to Visit  Medication Sig Dispense Refill  . albuterol (PROAIR HFA) 108 (90 Base) MCG/ACT inhaler Inhale 2 puffs into the lungs every 4 (four) hours as needed for wheezing or shortness of breath. 1 Inhaler 1  . beclomethasone (QVAR REDIHALER) 80 MCG/ACT inhaler Inhale 2 puffs into the lungs daily. 1 Inhaler 5  . benztropine (COGENTIN) 0.5 MG tablet Take 1 tablet (0.5 mg total) by mouth 2 (two) times daily. 60 tablet 0  . Cetirizine HCl 10 MG CAPS Take 10 mg by mouth daily as needed.    Marland Kitchen HYDROcodone-acetaminophen (NORCO) 10-325 MG tablet   0  . hydrOXYzine (ATARAX/VISTARIL) 25 MG tablet hydroxyzine HCl 25 mg tablet    . mirtazapine (REMERON) 15 MG tablet Take 1 tablet (15 mg total) by mouth at bedtime. 30 tablet 0  . montelukast (SINGULAIR) 5 MG chewable tablet CHEW 2 TABLETS BY MOUTH EVERY EVENING 60 tablet 5  . OSCIMIN 0.125 MG SUBL PLACE 1 TABLET UNDER TONGUE EVERY 6 HOURS AS NEEDED 60 each 3  . pantoprazole (PROTONIX) 20 MG tablet Take 1 tablet (20 mg  total) by mouth daily. 30 tablet 0  . risperiDONE (RISPERDAL) 3 MG tablet Take 1 tablet (3 mg total) by mouth at bedtime. 30 tablet 0   No current facility-administered medications on file prior to visit.     No Known Allergies  Objective: There were no vitals filed for this visit.  General: No acute distress, AAOx3  Right foot: Incisions well healed at surgical sites, minimal swelling to right foot, no erythema, no warmth, no drainage, no signs of infection noted, Capillary fill time <3 seconds in all digits, gross sensation present via light touch to right foot. No pain or crepitation with range of motion right foot.  No pain with calf compression.   Xrays- within normal limits for post op status, mild hallux deviation, hardware intact.   Assessment and Plan:  Problem List Items Addressed This Visit    None    Visit Diagnoses    Hallux valgus of right foot    -  Primary   Relevant Orders   DG Foot Complete Right   Metatarsalgia of right foot       Longitudinal deficiency of metatarsal bone       Capsulitis       Right foot pain       S/P foot surgery, right           -Patient seen and evaluated -xrays reviewed -continue with good supportive shoes daily  -Dispensed bunion sheild to use during day and toe splint to  use at bedtime -Continue with compression sleeve to help with swelling as needed  -May continue with scar creams to incision sites and gentle range of motion exercises  -Advised patient to ice and elevate daily as needed   -Will plan for increasing activitves and checking to see how she did with splints at next office visit. In the meantime, patient to call office if any issues or problems arise.   Asencion Islam, DPM

## 2018-05-18 MED FILL — MIRTAZAPINE 15 MG TABLET: 15 | 30 days supply | Qty: 30 | Fill #3

## 2018-05-18 MED FILL — risperiDONE 3 MG TABS: 3 | 30 days supply | Qty: 30 | Fill #3

## 2018-05-26 ENCOUNTER — Encounter: Payer: Self-pay | Admitting: Family Medicine

## 2018-05-26 ENCOUNTER — Ambulatory Visit (INDEPENDENT_AMBULATORY_CARE_PROVIDER_SITE_OTHER): Payer: No Typology Code available for payment source | Admitting: Family Medicine

## 2018-05-26 VITALS — BP 116/78 | HR 108 | Ht 63.0 in | Wt 134.4 lb

## 2018-05-26 DIAGNOSIS — Z0000 Encounter for general adult medical examination without abnormal findings: Secondary | ICD-10-CM | POA: Diagnosis not present

## 2018-05-26 DIAGNOSIS — J4540 Moderate persistent asthma, uncomplicated: Secondary | ICD-10-CM | POA: Diagnosis not present

## 2018-05-26 DIAGNOSIS — Z719 Counseling, unspecified: Secondary | ICD-10-CM | POA: Diagnosis not present

## 2018-05-26 DIAGNOSIS — M21612 Bunion of left foot: Secondary | ICD-10-CM | POA: Diagnosis not present

## 2018-05-26 MED ORDER — MONTELUKAST SODIUM 5 MG PO CHEW: CHEWABLE_TABLET | ORAL | 1 refills | Status: DC

## 2018-05-26 NOTE — Patient Instructions (Addendum)
Please realize, EXERCISE IS MEDICINE!  -  American Heart Association [facility]) guidelines for exercise : If you are in good health, without any medical conditions, you should engage in 150 minutes of moderate intensity aerobic activity per week.  This means you should be huffing and puffing throughout your workout.   Engaging in regular exercise will improve brain function and memory, as well as improve mood, boost immune system and help with weight management.  As well as the other, more well-known effects of exercise such as decreasing blood sugar levels, decreasing blood pressure,  and decreasing bad cholesterol levels/ increasing good cholesterol levels.     -  The AHA strongly endorses consumption of a diet that contains a variety of foods from all the food categories with an emphasis on fruits and vegetables; fat-free and low-fat dairy products; cereal and grain products; legumes and nuts; and fish, poultry, and/or extra lean meats.    Excessive food intake, especially of foods high in saturated and trans fats, sugar, and salt, should be avoided.    Adequate water intake of roughly 1/2 of your weight in pounds, should equal the ounces of water per day you should drink.  So for instance, if you're 200 pounds, that would be 100 ounces of water per day.         Mediterranean Diet  Why follow it? Research shows  Those who follow the Mediterranean diet have a reduced risk of heart disease   The diet is associated with a reduced incidence of Parkinson's and Alzheimer's diseases  People following the diet may have longer life expectancies and lower rates of chronic diseases   The Dietary Guidelines for Americans recommends the Mediterranean diet as an eating plan to promote health and prevent disease  What Is the Mediterranean Diet?   Healthy eating plan based on typical foods and recipes of Mediterranean-style cooking  The diet is primarily a plant based diet; these foods should make up a  majority of meals   Starches - Plant based foods should make up a majority of meals - They are an important sources of vitamins, minerals, energy, antioxidants, and fiber - Choose whole grains, foods high in fiber and minimally processed items  - Typical grain sources include wheat, oats, barley, corn, brown rice, bulgar, farro, millet, polenta, couscous  - Various types of beans include chickpeas, lentils, fava beans, black beans, white beans   Fruits  Veggies - Large quantities of antioxidant rich fruits & veggies; 6 or more servings  - Vegetables can be eaten raw or lightly drizzled with oil and cooked  - Vegetables common to the traditional Mediterranean Diet include: artichokes, arugula, beets, broccoli, brussel sprouts, cabbage, carrots, celery, collard greens, cucumbers, eggplant, kale, leeks, lemons, lettuce, mushrooms, okra, onions, peas, peppers, potatoes, pumpkin, radishes, rutabaga, shallots, spinach, sweet potatoes, turnips, zucchini - Fruits common to the Mediterranean Diet include: apples, apricots, avocados, cherries, clementines, dates, figs, grapefruits, grapes, melons, nectarines, oranges, peaches, pears, pomegranates, strawberries, tangerines  Fats - Replace butter and margarine with healthy oils, such as olive oil, canola oil, and tahini  - Limit nuts to no more than a handful a day  - Nuts include walnuts, almonds, pecans, pistachios, pine nuts  - Limit or avoid candied, honey roasted or heavily salted nuts - Olives are central to the Mediterranean diet - can be eaten whole or used in a variety of dishes   Meats Protein - Limiting red meat: no more than a few times a month -  When eating red meat: choose lean cuts and keep the portion to the size of deck of cards - Eggs: approx. 0 to 4 times a week  - Fish and lean poultry: at least 2 a week  - Healthy protein sources include, chicken, turkey, lean beef, lamb - Increase intake of seafood such as tuna, salmon, trout,  mackerel, shrimp, scallops - Avoid or limit high fat processed meats such as sausage and bacon  Dairy - Include moderate amounts of low fat dairy products  - Focus on healthy dairy such as fat free yogurt, skim milk, low or reduced fat cheese - Limit dairy products higher in fat such as whole or 2% milk, cheese, ice cream  Alcohol - Moderate amounts of red wine is ok  - No more than 5 oz daily for women (all ages) and men older than age 65  - No more than 10 oz of wine daily for men younger than 65  Other - Limit sweets and other desserts  - Use herbs and spices instead of salt to flavor foods  - Herbs and spices common to the traditional Mediterranean Diet include: basil, bay leaves, chives, cloves, cumin, fennel, garlic, lavender, marjoram, mint, oregano, parsley, pepper, rosemary, sage, savory, sumac, tarragon, thyme   It's not just a diet, it's a lifestyle:  . The Mediterranean diet includes lifestyle factors typical of those in the region  . Foods, drinks and meals are best eaten with others and savored . Daily physical activity is important for overall good health . This could be strenuous exercise like running and aerobics . This could also be more leisurely activities such as walking, housework, yard-work, or taking the stairs . Moderation is the key; a balanced and healthy diet accommodates most foods and drinks . Consider portion sizes and frequency of consumption of certain foods   Meal Ideas & Options:  . Breakfast:  o Whole wheat toast or whole wheat English muffins with peanut butter & hard boiled egg o Steel cut oats topped with apples & cinnamon and skim milk  o Fresh fruit: banana, strawberries, melon, berries, peaches  o Smoothies: strawberries, bananas, greek yogurt, peanut butter o Low fat greek yogurt with blueberries and granola  o Egg white omelet with spinach and mushrooms o Breakfast couscous: whole wheat couscous, apricots, skim milk, cranberries  . Sandwiches:   o Hummus and grilled vegetables (peppers, zucchini, squash) on whole wheat bread   o Grilled chicken on whole wheat pita with lettuce, tomatoes, cucumbers or tzatziki  o Tuna salad on whole wheat bread: tuna salad made with greek yogurt, olives, red peppers, capers, green onions o Garlic rosemary lamb pita: lamb sauted with garlic, rosemary, salt & pepper; add lettuce, cucumber, greek yogurt to pita - flavor with lemon juice and black pepper  . Seafood:  o Mediterranean grilled salmon, seasoned with garlic, basil, parsley, lemon juice and black pepper o Shrimp, lemon, and spinach whole-grain pasta salad made with low fat greek yogurt  o Seared scallops with lemon orzo  o Seared tuna steaks seasoned salt, pepper, coriander topped with tomato mixture of olives, tomatoes, olive oil, minced garlic, parsley, green onions and cappers  . Meats:  o Herbed greek chicken salad with kalamata olives, cucumber, feta  o Red bell peppers stuffed with spinach, bulgur, lean ground beef (or lentils) & topped with feta   o Kebabs: skewers of chicken, tomatoes, onions, zucchini, squash  o Turkey burgers: made with red onions, mint, dill, lemon juice, feta   cheese topped with roasted red peppers  Vegetarian o Cucumber salad: cucumbers, artichoke hearts, celery, red onion, feta cheese, tossed in olive oil & lemon juice  o Hummus and whole grain pita points with a greek salad (lettuce, tomato, feta, olives, cucumbers, red onion) o Lentil soup with celery, carrots made with vegetable broth, garlic, salt and pepper  o Tabouli salad: parsley, bulgur, mint, scallions, cucumbers, tomato, radishes, lemon juice, olive oil, salt and pepper.    Preventive Care for Adults, Female  A healthy lifestyle and preventive care can promote health and wellness. Preventive health guidelines for women include the following key practices.   A routine yearly physical is a good way to check with your health care provider about your  health and preventive screening. It is a chance to share any concerns and updates on your health and to receive a thorough exam.   Visit your dentist for a routine exam and preventive care every 6 months. Brush your teeth twice a day and floss once a day. Good oral hygiene prevents tooth decay and gum disease.   The frequency of eye exams is based on your age, health, family medical history, use of contact lenses, and other factors. Follow your health care provider's recommendations for frequency of eye exams.   Eat a healthy diet. Foods like vegetables, fruits, whole grains, low-fat dairy products, and lean protein foods contain the nutrients you need without too many calories. Decrease your intake of foods high in solid fats, added sugars, and salt. Eat the right amount of calories for you.Get information about a proper diet from your health care provider, if necessary.   Regular physical exercise is one of the most important things you can do for your health. Most adults should get at least 150 minutes of moderate-intensity exercise (any activity that increases your heart rate and causes you to sweat) each week. In addition, most adults need muscle-strengthening exercises on 2 or more days a week.   Maintain a healthy weight. The body mass index (BMI) is a screening tool to identify possible weight problems. It provides an estimate of body fat based on height and weight. Your health care provider can find your BMI, and can help you achieve or maintain a healthy weight.For adults 20 years and older:   - A BMI below 18.5 is considered underweight.   - A BMI of 18.5 to 24.9 is normal.   - A BMI of 25 to 29.9 is considered overweight.   - A BMI of 30 and above is considered obese.   Maintain normal blood lipids and cholesterol levels by exercising and minimizing your intake of trans and saturated fats.  Eat a balanced diet with plenty of fruit and vegetables. Blood tests for lipids and  cholesterol should begin at age 38 and be repeated every 5 years minimum.  If your lipid or cholesterol levels are high, you are over 40, or you are at high risk for heart disease, you may need your cholesterol levels checked more frequently.Ongoing high lipid and cholesterol levels should be treated with medicines if diet and exercise are not working.   If you smoke, find out from your health care provider how to quit. If you do not use tobacco, do not start.   Lung cancer screening is recommended for adults aged 5-80 years who are at high risk for developing lung cancer because of a history of smoking. A yearly low-dose CT scan of the lungs is recommended for people who have  at least a 30-pack-year history of smoking and are a current smoker or have quit within the past 15 years. A pack year of smoking is smoking an average of 1 pack of cigarettes a day for 1 year (for example: 1 pack a day for 30 years or 2 packs a day for 15 years). Yearly screening should continue until the smoker has stopped smoking for at least 15 years. Yearly screening should be stopped for people who develop a health problem that would prevent them from having lung cancer treatment.   If you are pregnant, do not drink alcohol. If you are breastfeeding, be very cautious about drinking alcohol. If you are not pregnant and choose to drink alcohol, do not have more than 1 drink per day. One drink is considered to be 12 ounces (355 mL) of beer, 5 ounces (148 mL) of wine, or 1.5 ounces (44 mL) of liquor.   Avoid use of street drugs. Do not share needles with anyone. Ask for help if you need support or instructions about stopping the use of drugs.   High blood pressure causes heart disease and increases the risk of stroke. Your blood pressure should be checked at least yearly.  Ongoing high blood pressure should be treated with medicines if weight loss and exercise do not work.   If you are 36-58 years old, ask your health  care provider if you should take aspirin to prevent strokes.   Diabetes screening involves taking a blood sample to check your fasting blood sugar level. This should be done once every 3 years, after age 70, if you are within normal weight and without risk factors for diabetes. Testing should be considered at a younger age or be carried out more frequently if you are overweight and have at least 1 risk factor for diabetes.   Breast cancer screening is essential preventive care for women. You should practice "breast self-awareness."  This means understanding the normal appearance and feel of your breasts and may include breast self-examination.  Any changes detected, no matter how small, should be reported to a health care provider.  Women in their 35s and 30s should have a clinical breast exam (CBE) by a health care provider as part of a regular health exam every 1 to 3 years.  After age 64, women should have a CBE every year.  Starting at age 21, women should consider having a mammogram (breast X-ray test) every year.  Women who have a family history of breast cancer should talk to their health care provider about genetic screening.  Women at a high risk of breast cancer should talk to their health care providers about having an MRI and a mammogram every year.   -Breast cancer gene (BRCA)-related cancer risk assessment is recommended for women who have family members with BRCA-related cancers. BRCA-related cancers include breast, ovarian, tubal, and peritoneal cancers. Having family members with these cancers may be associated with an increased risk for harmful changes (mutations) in the breast cancer genes BRCA1 and BRCA2. Results of the assessment will determine the need for genetic counseling and BRCA1 and BRCA2 testing.   The Pap test is a screening test for cervical cancer. A Pap test can show cell changes on the cervix that might become cervical cancer if left untreated. A Pap test is a procedure  in which cells are obtained and examined from the lower end of the uterus (cervix).   - Women should have a Pap test starting at age 56.   -  Between ages 110 and 29, Pap tests should be repeated every 2 years.   - Beginning at age 52, you should have a Pap test every 3 years as long as the past 3 Pap tests have been normal.   - Some women have medical problems that increase the chance of getting cervical cancer. Talk to your health care provider about these problems. It is especially important to talk to your health care provider if a new problem develops soon after your last Pap test. In these cases, your health care provider may recommend more frequent screening and Pap tests.   - The above recommendations are the same for women who have or have not gotten the vaccine for human papillomavirus (HPV).   - If you had a hysterectomy for a problem that was not cancer or a condition that could lead to cancer, then you no longer need Pap tests. Even if you no longer need a Pap test, a regular exam is a good idea to make sure no other problems are starting.   - If you are between ages 35 and 98 years, and you have had normal Pap tests going back 10 years, you no longer need Pap tests. Even if you no longer need a Pap test, a regular exam is a good idea to make sure no other problems are starting.   - If you have had past treatment for cervical cancer or a condition that could lead to cancer, you need Pap tests and screening for cancer for at least 20 years after your treatment.   - If Pap tests have been discontinued, risk factors (such as a new sexual partner) need to be reassessed to determine if screening should be resumed.   - The HPV test is an additional test that may be used for cervical cancer screening. The HPV test looks for the virus that can cause the cell changes on the cervix. The cells collected during the Pap test can be tested for HPV. The HPV test could be used to screen women aged 85  years and older, and should be used in women of any age who have unclear Pap test results. After the age of 21, women should have HPV testing at the same frequency as a Pap test.   Colorectal cancer can be detected and often prevented. Most routine colorectal cancer screening begins at the age of 87 years and continues through age 7 years. However, your health care provider may recommend screening at an earlier age if you have risk factors for colon cancer. On a yearly basis, your health care provider may provide home test kits to check for hidden blood in the stool.  Use of a small camera at the end of a tube, to directly examine the colon (sigmoidoscopy or colonoscopy), can detect the earliest forms of colorectal cancer. Talk to your health care provider about this at age 69, when routine screening begins. Direct exam of the colon should be repeated every 5 -10 years through age 19 years, unless early forms of pre-cancerous polyps or small growths are found.   People who are at an increased risk for hepatitis B should be screened for this virus. You are considered at high risk for hepatitis B if:  -You were born in a country where hepatitis B occurs often. Talk with your health care provider about which countries are considered high risk.  - Your parents were born in a high-risk country and you have not received a shot to  protect against hepatitis B (hepatitis B vaccine).  - You have HIV or AIDS.  - You use needles to inject street drugs.  - You live with, or have sex with, someone who has Hepatitis B.  - You get hemodialysis treatment.  - You take certain medicines for conditions like cancer, organ transplantation, and autoimmune conditions.   Hepatitis C blood testing is recommended for all people born from [birth year] through 1967 and any individual with known risks for hepatitis C.   Practice safe sex. Use condoms and avoid high-risk sexual practices to reduce the spread of sexually transmitted  infections (STIs). STIs include gonorrhea, chlamydia, syphilis, trichomonas, herpes, HPV, and human immunodeficiency virus (HIV). Herpes, HIV, and HPV are viral illnesses that have no cure. They can result in disability, cancer, and death. Sexually active women aged 67 years and younger should be checked for chlamydia. Older women with new or multiple partners should also be tested for chlamydia. Testing for other STIs is recommended if you are sexually active and at increased risk.   Osteoporosis is a disease in which the bones lose minerals and strength with aging. This can result in serious bone fractures or breaks. The risk of osteoporosis can be identified using a bone density scan. Women ages 101 years and over and women at risk for fractures or osteoporosis should discuss screening with their health care providers. Ask your health care provider whether you should take a calcium supplement or vitamin D to There are also several preventive steps women can take to avoid osteoporosis and resulting fractures or to keep osteoporosis from worsening. -->Recommendations include:  Eat a balanced diet high in fruits, vegetables, calcium, and vitamins.  Get enough calcium. The recommended total intake of is 1,200 mg daily; for best absorption, if taking supplements, divide doses into 250-500 mg doses throughout the day. Of the two types of calcium, calcium carbonate is best absorbed when taken with food but calcium citrate can be taken on an empty stomach.  Get enough vitamin D. [employer] and the [place] recommend at least 1,000 IU per day for women age 84 and over who are at risk of vitamin D deficiency. Vitamin D deficiency can be caused by inadequate sun exposure (for example, those who live in [place]).  Avoid alcohol and smoking. Heavy alcohol intake (more than 7 drinks per week) increases the risk of falls and hip fracture and women smokers tend to lose bone more rapidly  and have lower bone mass than nonsmokers. Stopping smoking is one of the most important changes women can make to improve their health and decrease risk for disease.  Be physically active every day. Weight-bearing exercise (for example, fast walking, hiking, jogging, and weight training) may strengthen bones or slow the rate of bone loss that comes with aging. Balancing and muscle-strengthening exercises can reduce the risk of falling and fracture.  Consider therapeutic medications. Currently, several types of effective drugs are available. Healthcare providers can recommend the type most appropriate for each woman.  Eliminate environmental factors that may contribute to accidents. Falls cause nearly 90% of all osteoporotic fractures, so reducing this risk is an important bone-health strategy. Measures include ample lighting, removing obstructions to walking, using nonskid rugs on floors, and placing mats and/or grab bars in showers.  Be aware of medication side effects. Some common medicines make bones weaker. These include a type of steroid drug called glucocorticoids used for arthritis and asthma, some antiseizure drugs, certain sleeping pills, treatments for endometriosis,  and some cancer drugs. An overactive thyroid gland or using too much thyroid hormone for an underactive thyroid can also be a problem. If you are taking these medicines, talk to your doctor about what you can do to help protect your bones.reduce the rate of osteoporosis.    Menopause can be associated with physical symptoms and risks. Hormone replacement therapy is available to decrease symptoms and risks. You should talk to your health care provider about whether hormone replacement therapy is right for you.   Use sunscreen. Apply sunscreen liberally and repeatedly throughout the day. You should seek shade when your shadow is shorter than you. Protect yourself by wearing long sleeves, pants, a wide-brimmed hat, and sunglasses  year round, whenever you are outdoors.   Once a month, do a whole body skin exam, using a mirror to look at the skin on your back. Tell your health care provider of new moles, moles that have irregular borders, moles that are larger than a pencil eraser, or moles that have changed in shape or color.   -Stay current with required vaccines (immunizations).   Influenza vaccine. All adults should be immunized every year.  Tetanus, diphtheria, and acellular pertussis (Td, Tdap) vaccine. Pregnant women should receive 1 dose of Tdap vaccine during each pregnancy. The dose should be obtained regardless of the length of time since the last dose. Immunization is preferred during the 27th 36th week of gestation. An adult who has not previously received Tdap or who does not know her vaccine status should receive 1 dose of Tdap. This initial dose should be followed by tetanus and diphtheria toxoids (Td) booster doses every 10 years. Adults with an unknown or incomplete history of completing a 3-dose immunization series with Td-containing vaccines should begin or complete a primary immunization series including a Tdap dose. Adults should receive a Td booster every 10 years.  Varicella vaccine. An adult without evidence of immunity to varicella should receive 2 doses or a second dose if she has previously received 1 dose. Pregnant females who do not have evidence of immunity should receive the first dose after pregnancy. This first dose should be obtained before leaving the health care facility. The second dose should be obtained 4 8 weeks after the first dose.  Human papillomavirus (HPV) vaccine. Females aged 11 26 years who have not received the vaccine previously should obtain the 3-dose series. The vaccine is not recommended for use in pregnant females. However, pregnancy testing is not needed before receiving a dose. If a female is found to be pregnant after receiving a dose, no treatment is needed. In that  case, the remaining doses should be delayed until after the pregnancy. Immunization is recommended for any person with an immunocompromised condition through the age of 25 years if she did not get any or all doses earlier. During the 3-dose series, the second dose should be obtained 4 8 weeks after the first dose. The third dose should be obtained 24 weeks after the first dose and 16 weeks after the second dose.  Zoster vaccine. One dose is recommended for adults aged 46 years or older unless certain conditions are present.  Measles, mumps, and rubella (MMR) vaccine. Adults born before 27 generally are considered immune to measles and mumps. Adults born in [birth year] or later should have 1 or more doses of MMR vaccine unless there is a contraindication to the vaccine or there is laboratory evidence of immunity to each of the three diseases. A routine second dose  of MMR vaccine should be obtained at least 28 days after the first dose for students attending postsecondary schools, health care workers, or international travelers. People who received inactivated measles vaccine or an unknown type of measles vaccine during 1963 1967 should receive 2 doses of MMR vaccine. People who received inactivated mumps vaccine or an unknown type of mumps vaccine before 1979 and are at high risk for mumps infection should consider immunization with 2 doses of MMR vaccine. For females of childbearing age, rubella immunity should be determined. If there is no evidence of immunity, females who are not pregnant should be vaccinated. If there is no evidence of immunity, females who are pregnant should delay immunization until after pregnancy. Unvaccinated health care workers born before [birth year] who lack laboratory evidence of measles, mumps, or rubella immunity or laboratory confirmation of disease should consider measles and mumps immunization with 2 doses of MMR vaccine or rubella immunization with 1 dose of MMR vaccine.  Pneumococcal  13-valent conjugate (PCV13) vaccine. When indicated, a person who is uncertain of her immunization history and has no record of immunization should receive the PCV13 vaccine. An adult aged 18 years or older who has certain medical conditions and has not been previously immunized should receive 1 dose of PCV13 vaccine. This PCV13 should be followed with a dose of pneumococcal polysaccharide (PPSV23) vaccine. The PPSV23 vaccine dose should be obtained at least 8 weeks after the dose of PCV13 vaccine. An adult aged 37 years or older who has certain medical conditions and previously received 1 or more doses of PPSV23 vaccine should receive 1 dose of PCV13. The PCV13 vaccine dose should be obtained 1 or more years after the last PPSV23 vaccine dose.  Pneumococcal polysaccharide (PPSV23) vaccine. When PCV13 is also indicated, PCV13 should be obtained first. All adults aged 55 years and older should be immunized. An adult younger than age 50 years who has certain medical conditions should be immunized. Any person who resides in a nursing home or long-term care facility should be immunized. An adult smoker should be immunized. People with an immunocompromised condition and certain other conditions should receive both PCV13 and PPSV23 vaccines. People with human immunodeficiency virus (HIV) infection should be immunized as soon as possible after diagnosis. Immunization during chemotherapy or radiation therapy should be avoided. Routine use of PPSV23 vaccine is not recommended for American Indians, [place] Natives, or people younger than 65 years unless there are medical conditions that require PPSV23 vaccine. When indicated, people who have unknown immunization and have no record of immunization should receive PPSV23 vaccine. One-time revaccination 5 years after the first dose of PPSV23 is recommended for people aged 40 64 years who have chronic kidney failure, nephrotic syndrome, asplenia, or immunocompromised conditions.  People who received 1 2 doses of PPSV23 before age 29 years should receive another dose of PPSV23 vaccine at age 85 years or later if at least 5 years have passed since the previous dose. Doses of PPSV23 are not needed for people immunized with PPSV23 at or after age 25 years.  Meningococcal vaccine. Adults with asplenia or persistent complement component deficiencies should receive 2 doses of quadrivalent meningococcal conjugate (MenACWY-D) vaccine. The doses should be obtained at least 2 months apart. Microbiologists working with certain meningococcal bacteria, [place] recruits, people at risk during an outbreak, and people who travel to or live in countries with a high rate of meningitis should be immunized. A first-year college student up through age 77 years who is living in  a residence hall should receive a dose if she did not receive a dose on or after her 16th birthday. Adults who have certain high-risk conditions should receive one or more doses of vaccine.  Hepatitis A vaccine. Adults who wish to be protected from this disease, have certain high-risk conditions, work with hepatitis A-infected animals, work in hepatitis A research labs, or travel to or work in countries with a high rate of hepatitis A should be immunized. Adults who were previously unvaccinated and who anticipate close contact with an international adoptee during the first 60 days after arrival in the [place] from a country with a high rate of hepatitis A should be immunized.  Hepatitis B vaccine.  Adults who wish to be protected from this disease, have certain high-risk conditions, may be exposed to blood or other infectious body fluids, are household contacts or sex partners of hepatitis B positive people, are clients or workers in certain care facilities, or travel to or work in countries with a high rate of hepatitis B should be immunized.  Haemophilus influenzae type b (Hib) vaccine. A previously unvaccinated person with  asplenia or sickle cell disease or having a scheduled splenectomy should receive 1 dose of Hib vaccine. Regardless of previous immunization, a recipient of a hematopoietic stem cell transplant should receive a 3-dose series 6 12 months after her successful transplant. Hib vaccine is not recommended for adults with HIV infection.  Preventive Services / Frequency Ages 50 to 39years  Blood pressure check.** / Every 1 to 2 years.  Lipid and cholesterol check.** / Every 5 years beginning at age 26.  Clinical breast exam.** / Every 3 years for women in their 16s and 35s.  BRCA-related cancer risk assessment.** / For women who have family members with a BRCA-related cancer (breast, ovarian, tubal, or peritoneal cancers).  Pap test.** / Every 2 years from ages 61 through 28. Every 3 years starting at age 63 through age 47 or 53 with a history of 3 consecutive normal Pap tests.  HPV screening.** / Every 3 years from ages 61 through ages 51 to 2 with a history of 3 consecutive normal Pap tests.  Hepatitis C blood test.** / For any individual with known risks for hepatitis C.  Skin self-exam. / Monthly.  Influenza vaccine. / Every year.  Tetanus, diphtheria, and acellular pertussis (Tdap, Td) vaccine.** / Consult your health care provider. Pregnant women should receive 1 dose of Tdap vaccine during each pregnancy. 1 dose of Td every 10 years.  Varicella vaccine.** / Consult your health care provider. Pregnant females who do not have evidence of immunity should receive the first dose after pregnancy.  HPV vaccine. / 3 doses over 6 months, if 58 and younger. The vaccine is not recommended for use in pregnant females. However, pregnancy testing is not needed before receiving a dose.  Measles, mumps, rubella (MMR) vaccine.** / You need at least 1 dose of MMR if you were born in 1957 or later. You may also need a 2nd dose. For females of childbearing age, rubella immunity should be determined. If there  is no evidence of immunity, females who are not pregnant should be vaccinated. If there is no evidence of immunity, females who are pregnant should delay immunization until after pregnancy.  Pneumococcal 13-valent conjugate (PCV13) vaccine.** / Consult your health care provider.  Pneumococcal polysaccharide (PPSV23) vaccine.** / 1 to 2 doses if you smoke cigarettes or if you have certain conditions.  Meningococcal vaccine.** / 1 dose  if you are age 43 to 19 years and a Market researcher living in a residence hall, or have one of several medical conditions, you need to get vaccinated against meningococcal disease. You may also need additional booster doses.  Hepatitis A vaccine.** / Consult your health care provider.  Hepatitis B vaccine.** / Consult your health care provider.  Haemophilus influenzae type b (Hib) vaccine.** / Consult your health care provider.  Ages 60 to 64years  Blood pressure check.** / Every 1 to 2 years.  Lipid and cholesterol check.** / Every 5 years beginning at age 71 years.  Lung cancer screening. / Every year if you are aged 86 80 years and have a 30-pack-year history of smoking and currently smoke or have quit within the past 15 years. Yearly screening is stopped once you have quit smoking for at least 15 years or develop a health problem that would prevent you from having lung cancer treatment.  Clinical breast exam.** / Every year after age 68 years.  BRCA-related cancer risk assessment.** / For women who have family members with a BRCA-related cancer (breast, ovarian, tubal, or peritoneal cancers).  Mammogram.** / Every year beginning at age 40 years and continuing for as long as you are in good health. Consult with your health care provider.  Pap test.** / Every 3 years starting at age 88 years through age 11 or 57 years with a history of 3 consecutive normal Pap tests.  HPV screening.** / Every 3 years from ages 80 years through ages 14 to 80  years with a history of 3 consecutive normal Pap tests.  Fecal occult blood test (FOBT) of stool. / Every year beginning at age 24 years and continuing until age 85 years. You may not need to do this test if you get a colonoscopy every 10 years.  Flexible sigmoidoscopy or colonoscopy.** / Every 5 years for a flexible sigmoidoscopy or every 10 years for a colonoscopy beginning at age 44 years and continuing until age 61 years.  Hepatitis C blood test.** / For all people born from [birth year] through 1967 and any individual with known risks for hepatitis C.  Skin self-exam. / Monthly.  Influenza vaccine. / Every year.  Tetanus, diphtheria, and acellular pertussis (Tdap/Td) vaccine.** / Consult your health care provider. Pregnant women should receive 1 dose of Tdap vaccine during each pregnancy. 1 dose of Td every 10 years.  Varicella vaccine.** / Consult your health care provider. Pregnant females who do not have evidence of immunity should receive the first dose after pregnancy.  Zoster vaccine.** / 1 dose for adults aged 33 years or older.  Measles, mumps, rubella (MMR) vaccine.** / You need at least 1 dose of MMR if you were born in 1957 or later. You may also need a 2nd dose. For females of childbearing age, rubella immunity should be determined. If there is no evidence of immunity, females who are not pregnant should be vaccinated. If there is no evidence of immunity, females who are pregnant should delay immunization until after pregnancy.  Pneumococcal 13-valent conjugate (PCV13) vaccine.** / Consult your health care provider.  Pneumococcal polysaccharide (PPSV23) vaccine.** / 1 to 2 doses if you smoke cigarettes or if you have certain conditions.  Meningococcal vaccine.** / Consult your health care provider.  Hepatitis A vaccine.** / Consult your health care provider.  Hepatitis B vaccine.** / Consult your health care provider.  Haemophilus influenzae type b (Hib) vaccine.** / Consult  your health care provider.  Ages 13  years and over  Blood pressure check.** / Every 1 to 2 years.  Lipid and cholesterol check.** / Every 5 years beginning at age 5 years.  Lung cancer screening. / Every year if you are aged 67 80 years and have a 30-pack-year history of smoking and currently smoke or have quit within the past 15 years. Yearly screening is stopped once you have quit smoking for at least 15 years or develop a health problem that would prevent you from having lung cancer treatment.  Clinical breast exam.** / Every year after age 30 years.  BRCA-related cancer risk assessment.** / For women who have family members with a BRCA-related cancer (breast, ovarian, tubal, or peritoneal cancers).  Mammogram.** / Every year beginning at age 63 years and continuing for as long as you are in good health. Consult with your health care provider.  Pap test.** / Every 3 years starting at age 22 years through age 49 or 36 years with 3 consecutive normal Pap tests. Testing can be stopped between 65 and 70 years with 3 consecutive normal Pap tests and no abnormal Pap or HPV tests in the past 10 years.  HPV screening.** / Every 3 years from ages 36 years through ages 11 or 45 years with a history of 3 consecutive normal Pap tests. Testing can be stopped between 65 and 70 years with 3 consecutive normal Pap tests and no abnormal Pap or HPV tests in the past 10 years.  Fecal occult blood test (FOBT) of stool. / Every year beginning at age 61 years and continuing until age 45 years. You may not need to do this test if you get a colonoscopy every 10 years.  Flexible sigmoidoscopy or colonoscopy.** / Every 5 years for a flexible sigmoidoscopy or every 10 years for a colonoscopy beginning at age 56 years and continuing until age 42 years.  Hepatitis C blood test.** / For all people born from [birth year] through 1967 and any individual with known risks for hepatitis C.  Osteoporosis screening.** / A  one-time screening for women ages 35 years and over and women at risk for fractures or osteoporosis.  Skin self-exam. / Monthly.  Influenza vaccine. / Every year.  Tetanus, diphtheria, and acellular pertussis (Tdap/Td) vaccine.** / 1 dose of Td every 10 years.  Varicella vaccine.** / Consult your health care provider.  Zoster vaccine.** / 1 dose for adults aged 36 years or older.  Pneumococcal 13-valent conjugate (PCV13) vaccine.** / Consult your health care provider.  Pneumococcal polysaccharide (PPSV23) vaccine.** / 1 dose for all adults aged 67 years and older.  Meningococcal vaccine.** / Consult your health care provider.  Hepatitis A vaccine.** / Consult your health care provider.  Hepatitis B vaccine.** / Consult your health care provider.  Haemophilus influenzae type b (Hib) vaccine.** / Consult your health care provider. ** Family history and personal history of risk and conditions may change your health care provider's recommendations. Document Released: 02/09/2002 Document Revised: 10/04/2013  [facility] Patient Information 2014 [place], [place].   EXERCISE AND DIET:  We recommended that you start or continue a regular exercise program for good health. Regular exercise means any activity that makes your heart beat faster and makes you sweat.  We recommend exercising at least 30 minutes per day at least 3 days a week, preferably 5.  We also recommend a diet low in fat and sugar / carbohydrates.  Inactivity, poor dietary choices and obesity can cause diabetes, heart attack, stroke, and kidney damage, among others.  ALCOHOL AND SMOKING:  Women should limit their alcohol intake to no more than 7 drinks/beers/glasses of wine (combined, not each!) per week. Moderation of alcohol intake to this level decreases your risk of breast cancer and liver damage.  ( And of course, no recreational drugs are part of a healthy lifestyle.)  Also, you should not be smoking at all or even being  exposed to second hand smoke. Most people know smoking can cause cancer, and various heart and lung diseases, but did you know it also contributes to weakening of your bones?  Aging of your skin?  Yellowing of your teeth and nails?   CALCIUM AND VITAMIN D:  Adequate intake of calcium and Vitamin D are recommended.  The recommendations for exact amounts of these supplements seem to change often, but generally speaking 600 mg of calcium (either carbonate or citrate) and 800 units of Vitamin D per day seems prudent. Certain women may benefit from higher intake of Vitamin D.  If you are among these women, your doctor will have told you during your visit.     PAP SMEARS:  Pap smears, to check for cervical cancer or precancers,  have traditionally been done yearly, although recent scientific advances have shown that most women can have pap smears less often.  However, every woman still should have a physical exam from her gynecologist or primary care physician every year. It will include a breast check, inspection of the vulva and vagina to check for abnormal growths or skin changes, a visual exam of the cervix, and then an exam to evaluate the size and shape of the uterus and ovaries.  And after 49 years of age, a rectal exam is indicated to check for rectal cancers. We will also provide age appropriate advice regarding health maintenance, like when you should have certain vaccines, screening for sexually transmitted diseases, bone density testing, colonoscopy, mammograms, etc.    MAMMOGRAMS:  All women over 21 years old should have a yearly mammogram. Many facilities now offer a "3D" mammogram, which may cost around $50 extra out of pocket. If possible,  we recommend you accept the option to have the 3D mammogram performed.  It both reduces the number of women who will be called back for extra views which then turn out to be normal, and it is better than the routine mammogram at detecting truly abnormal  areas.     Follow up in the near future at your convenience to talk about PHQ 9 results.  COLONOSCOPY:  Colonoscopy to screen for colon cancer is recommended for all women at age 58.  We know, you hate the idea of the prep.  We agree, BUT, having colon cancer and not knowing it is worse!!  Colon cancer so often starts as a polyp that can be seen and removed at colonscopy, which can quite literally save your life!  And if your first colonoscopy is normal and you have no family history of colon cancer, most women don't have to have it again for 10 years.  Once every ten years, you can do something that may end up saving your life, right?  We will be happy to help you get it scheduled when you are ready.  Be sure to check your insurance coverage so you understand how much it will cost.  It may be covered as a preventative service at no cost, but you should check your particular policy.

## 2018-05-26 NOTE — Progress Notes (Signed)
Impression and Recommendations:    1. Encounter for wellness examination   2. Health education/counseling   3. Moderate persistent asthma, uncomplicated   4. Bunion of great toe of left foot    - Patient's last visit was almost two years ago.  - Patient knows to obtain records from other practices and have them sent here.  1) Anticipatory Guidance: Discussed importance of wearing a seatbelt while driving, not texting while driving; sunscreen when outside along with yearly skin surveillance; eating a well balanced and modest diet; physical activity at least 25 minutes per day or 150 min/ week of moderate to intense activity.  2) Immunizations / Screenings / Labs: All immunizations and screenings that patient agrees to, are up-to-date per recommendations or will be updated today.  Patient understands the needs for q 39mo dental and yearly vision screens which pt will schedule independently. Obtain CBC, CMP, HgA1c, Lipid panel, TSH and vit D when fasting if not already done recently.   - Patient had a colonoscopy in the past, 3-4 years ago.  - Mammogram recommended once yearly or, at most, once every two years.  - Pap smear recommended once every 2-3 years.  Patient should continue to follow up with OBGYN.  - Patient wishes for tetanus vaccine, administered today.  - Low-risk adult screening for HIV and Hepatitis C discussed and recommended to the patient today.  - Begin obtaining dilated eye exams once every two years.  - Continue with regular visits to the dentist every 6 months.   3) Weight: Discussed goal of losing even 5-10% of current body weight which would improve overall feelings of well being and improve objective health data significantly.   Improve nutrient density of diet through increasing intake of fruits and vegetables and decreasing saturated/trans fats, white flour products and refined sugar products.   Explained to patient what BMI refers to, and what it  means medically.    Told patient to think about it as a "medical risk stratification measurement" and how increasing BMI is associated with increasing risk/ or worsening state of various diseases such as hypertension, hyperlipidemia, diabetes, premature OA, depression etc.  American Heart Association guidelines for healthy diet, basically Mediterranean diet, and exercise guidelines of 30 minutes 5 days per week or more discussed in detail.  Health counseling performed.  All questions answered.   4) Exercise & Lifestyle Habits: - Advised patient to continue working toward exercising to improve health.    - Patient will begin with 15 minutes of activity daily. Recommended that the patient eventually strive for at least 150 minutes of cardio per week according to the [facility].   - Healthy dietary habits encouraged, including low-carb, and high amounts of lean protein in diet.   - Patient should also consume adequate amounts of water - half of body weight in oz of water per day.  - Encouraged patient to monitor her constipation and hydrate adequately.   5) Follow Up: - Patient will return at her nearest convenience for fasting labs.  - Patient willing to return for lab follow-up appointment to discuss FBW.  - Encouraged patient to return for regularly scheduled follow-up appointments.   Meds ordered this encounter  Medications  . montelukast (SINGULAIR) 5 MG chewable tablet    Sig: CHEW 2 TABLETS BY MOUTH EVERY EVENING    Dispense:  60 tablet    Refill:  1    Orders Placed This Encounter  Procedures  . CBC with Differential/Platelet  .  Comprehensive metabolic panel  . Hemoglobin A1c  . Lipid panel  . TSH  . VITAMIN D 25 Hydroxy (Vit-D Deficiency, Fractures)  . T4, free  . HIV antibody  . Hepatitis C antibody  . Ambulatory referral to Podiatry    Gross side effects, risk and benefits, and alternatives of medications discussed with patient.  Patient is aware that all medications  have potential side effects and we are unable to predict every side effect or drug-drug interaction that may occur.  Expresses verbal understanding and consents to current therapy plan and treatment regimen.  F-up preventative CPE in 1 year. F/up sooner for chronic care management as discussed and/or prn.  Please see orders placed and AVS handed out to patient at the end of our visit for further patient instructions/ counseling done pertaining to today's office visit.  This document serves as a record of services personally performed by [name], DO. It was created on her behalf by Peggye Fothergill, a trained medical scribe. The creation of this record is based on the scribe's personal observations and the provider's statements to them.   I have reviewed the above medical documentation for accuracy and completeness and I concur.  [name] 06/02/18 12:13 PM    Subjective:    Chief Complaint  Patient presents with  . Annual Exam    HPI: Shelia Ramos is a 47 y.o. female who presents to [facility] today a yearly health maintenance exam.  Her last visit was nearly two years ago (10/09/2016).  Health Maintenance Summary Reviewed and updated, unless pt declines services.  Colonoscopy:  Patient had a colonoscopy with Shelia Ramos in 2015 with Dr. Arlyce Dice, to evaluate bleeding. Tobacco History Reviewed:   Y; never smoker. Alcohol:    No concerns, no excessive use. Exercise Habits:   Not meeting AHA guidelines STD concerns:   none Drug Use:   None Birth control method:   Per patient, IUD device in place. 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  Patient notes that she is obtaining her mammogram once every two years due to having an IUD in place.  Patient has an OBGYN she follows up with regularly.  No family history of uterine, breast, colon, or  other cancers that she knows of.  Notes the only incidences of cancer in her family occurred in her grandmother and grandfather.  Grandmother had stomach cancer, but isn't sure what type of cancer occurred with her grandfather.  Notes that she is not eating healthy and has not been exercising lately.  Notes "I just don't feel like it."  Patient has a history of low Vitamin D.  Last eye exam was last July; she obtains eye exams yearly.  Patient obtains regular dental appointments every six months.  Denies chest pain, SOB, irregular heart palpitations, or unusual difficulties with GI tract. Patient notes that she has constipation, and that she doesn't drink much water.  Drinks Diet Dr. Marissa Nestle, 1 in the morning, 1 at lunchtime, and some at night, and that's all she drinks all day.  Notes that she rarely drinks water.  Immunization History  Administered Date(s) Administered  . Influenza,inj,Quad PF,6+ Mos 10/09/2016  . Influenza-Unspecified 09/27/2016    Health Maintenance  Topic Date Due  . PAP SMEAR  02/14/1990  . TETANUS/TDAP  05/27/2019 (Originally  02/15/1988)  . HIV Screening  05/27/2019 (Originally 02/15/1984)  . INFLUENZA VACCINE  07/28/2018    Depression screen PHQ 2/9 05/26/2018  Decreased Interest 2  Down, Depressed, Hopeless 1  PHQ - 2 Score 3  Altered sleeping 1  Tired, decreased energy 3  Change in appetite 3  Feeling bad or failure about yourself  0  Trouble concentrating 0  Moving slowly or fidgety/restless 0  Suicidal thoughts 0  PHQ-9 Score 10  Difficult doing work/chores Somewhat difficult    Wt Readings from Last 3 Encounters:  05/26/18 134 lb 6.4 oz (61 kg)  08/13/17 131 lb 12.8 oz (59.8 kg)  04/07/17 123 lb (55.8 kg)   BP Readings from Last 3 Encounters:  05/26/18 116/78  03/01/18 (!) 94/49  11/16/17 91/63   Pulse Readings from Last 3 Encounters:  05/26/18 (!) 108  03/01/18 (!) 112  11/16/17 (!) 115     Past Medical History:    Diagnosis Date  . Allergy    SEASONAL  . Anxiety   . Asthma    AS A CHILD  . Depression   . IBS (irritable bowel syndrome)   . Seasonal allergies       Past Surgical History:  Procedure Laterality Date  . BUNIONECTOMY Right   . COLPOSCOPY        Family History  Problem Relation Age of Onset  . Anxiety disorder Mother   . Depression Mother   . Allergic rhinitis Father   . Allergic rhinitis Paternal Aunt   . Allergic rhinitis Paternal Grandmother   . Cancer Paternal Grandmother        stomach  . Heart attack Maternal Grandfather   . Angioedema Neg Hx   . Asthma Neg Hx   . Atopy Neg Hx   . Eczema Neg Hx   . Immunodeficiency Neg Hx   . Urticaria Neg Hx       Social History   Substance and Sexual Activity  Drug Use No  ,   Social History   Substance and Sexual Activity  Alcohol Use No  . Alcohol/week: 0.0 oz  ,   Social History   Tobacco Use  Smoking Status Never Smoker  Smokeless Tobacco Never Used  ,   Social History   Substance and Sexual Activity  Sexual Activity Yes  . Birth control/protection: Patch    Current Outpatient Medications on File Prior to Visit  Medication Sig Dispense Refill  . albuterol (PROAIR HFA) 108 (90 Base) MCG/ACT inhaler Inhale 2 puffs into the lungs every 4 (four) hours as needed for wheezing or shortness of breath. 1 Inhaler 1  . beclomethasone (QVAR REDIHALER) 80 MCG/ACT inhaler Inhale 2 puffs into the lungs daily. 1 Inhaler 5  . benztropine (COGENTIN) 0.5 MG tablet Take 1 tablet (0.5 mg total) by mouth 2 (two) times daily. 60 tablet 0  . Cetirizine HCl 10 MG CAPS Take 10 mg by mouth daily as needed.    Marland Kitchen HYDROcodone-acetaminophen (NORCO) 10-325 MG tablet   0  . hydrOXYzine (ATARAX/VISTARIL) 25 MG tablet hydroxyzine HCl 25 mg tablet    . mirtazapine (REMERON) 15 MG tablet Take 1 tablet (15 mg total) by mouth at bedtime. 30 tablet 0  . OSCIMIN 0.125 MG SUBL PLACE 1 TABLET UNDER TONGUE EVERY 6 HOURS AS NEEDED 60  each 3  . risperiDONE (RISPERDAL) 3 MG tablet Take 1 tablet (3 mg total) by mouth at bedtime. 30 tablet 0   No current facility-administered medications on file prior  to visit.     Allergies: Patient has no known allergies.  Review of Systems: General:   Denies fever, chills, unexplained weight loss.  Optho/Auditory:   Denies visual changes, blurred vision/LOV Respiratory:   Denies SOB, DOE more than baseline levels.  Cardiovascular:   Denies chest pain, palpitations, new onset peripheral edema  Gastrointestinal:   Denies nausea, vomiting, diarrhea.  Genitourinary: Denies dysuria, freq/ urgency, flank pain or discharge from genitals.  Endocrine:     Denies hot or cold intolerance, polyuria, polydipsia. Musculoskeletal:   Denies unexplained myalgias, joint swelling, unexplained arthralgias, gait problems.  Skin:  Denies rash, suspicious lesions Neurological:     Denies dizziness, unexplained weakness, numbness  Psychiatric/Behavioral:   Denies mood changes, suicidal or homicidal ideations, hallucinations    Objective:    Blood pressure 116/78, pulse (!) 108, height  (1.6 m), weight 134 lb 6.4 oz (61 kg), SpO2 98 %. Body mass index is 23.81 kg/m. General Appearance:    Alert, cooperative, no distress, appears stated age  Head:    Normocephalic, without obvious abnormality, atraumatic  Eyes:    PERRL, conjunctiva/corneas clear, EOM's intact, fundi    benign, both eyes  Ears:    Normal TM's and external ear canals, both ears  Nose:   Nares normal, septum midline, mucosa normal, no drainage    or sinus tenderness  Throat:   Lips w/o lesion, mucosa moist, and tongue normal; teeth and   gums normal  Neck:   Supple, symmetrical, trachea midline, no adenopathy;    thyroid:  no enlargement/tenderness/nodules; no carotid   bruit or JVD  Back:     Symmetric, no curvature, ROM normal, no CVA tenderness  Lungs:     Clear to auscultation bilaterally, respirations unlabored, no        Wh/ R/ R  Chest Wall:    No tenderness or gross deformity; normal excursion   Heart:    Regular rate and rhythm, S1 and S2 normal, no murmur, rub   or gallop  Breast Exam:    Not performed.  Abdomen:     Soft, non-tender, bowel sounds active all four quadrants, NO   G/R/R, no masses, no organomegaly  Genitalia:   Not performed.  Rectal:   Not performed.  Extremities:   Extremities normal, atraumatic, no cyanosis or gross edema  Pulses:   2+ and symmetric all extremities  Skin:   Warm, dry, Skin color, texture, turgor normal, no obvious rashes or lesions Psych: No HI/SI, judgement and insight good, Euthymic mood. Full Affect.  Neurologic:   CNII-XII intact, normal strength, sensation and reflexes    Throughout

## 2018-06-02 DIAGNOSIS — Z719 Counseling, unspecified: Secondary | ICD-10-CM | POA: Insufficient documentation

## 2018-06-03 MED FILL — BENZTROPINE MES 0.5 MG TAB: 0.5 | 30 days supply | Qty: 60 | Fill #2

## 2018-06-03 MED FILL — MONTELUKAST SOD 5 MG TAB CH: 5 | 30 days supply | Qty: 60 | Fill #0

## 2018-06-17 MED FILL — risperiDONE 3 MG TABS: 3 | 15 days supply | Qty: 30 | Fill #0

## 2018-06-17 MED FILL — hydrOXYzine HCL 25 MG TABS: 25 | 30 days supply | Qty: 60 | Fill #0

## 2018-06-17 MED FILL — MIRTAZAPINE 15 MG TABLET: 15 | 30 days supply | Qty: 30 | Fill #0

## 2018-06-21 ENCOUNTER — Encounter: Payer: Self-pay | Admitting: Sports Medicine

## 2018-06-21 ENCOUNTER — Ambulatory Visit (INDEPENDENT_AMBULATORY_CARE_PROVIDER_SITE_OTHER): Payer: No Typology Code available for payment source | Admitting: Sports Medicine

## 2018-06-21 DIAGNOSIS — M2011 Hallux valgus (acquired), right foot: Secondary | ICD-10-CM | POA: Diagnosis not present

## 2018-06-21 DIAGNOSIS — M7741 Metatarsalgia, right foot: Secondary | ICD-10-CM

## 2018-06-21 DIAGNOSIS — M779 Enthesopathy, unspecified: Secondary | ICD-10-CM | POA: Diagnosis not present

## 2018-06-21 DIAGNOSIS — Z9889 Other specified postprocedural states: Secondary | ICD-10-CM

## 2018-06-21 DIAGNOSIS — M79671 Pain in right foot: Secondary | ICD-10-CM

## 2018-06-21 DIAGNOSIS — Q738 Other reduction defects of unspecified limb(s): Secondary | ICD-10-CM

## 2018-06-21 DIAGNOSIS — Q72899 Other reduction defects of unspecified lower limb: Secondary | ICD-10-CM

## 2018-06-21 NOTE — Progress Notes (Signed)
Subjective: Shelia Ramos is a 47 y.o. female patient seen today in office for POV #6 (DOS 02-21-18), S/P R Austin Akin Bunionectomy and 2nd metatarsal osteotomy. Patient denies pain at surgical site, states that she is doing well with no pain and using her splints.  Patient denies calf pain, denies headache, chest pain, shortness of breath, nausea, vomiting, fever, or chills. No other issues noted.   Patient Active Problem List   Diagnosis Date Noted  . Health education/counseling 06/02/2018  . Bipolar disorder, curr episode depressed, severe, w/psychotic features (HCC) 05/03/2017  . Bunion of great toe of left foot 10/09/2016  . IBS (irritable bowel syndrome) 10/09/2016  . Perimenopause- on Xulane 10/09/2016  . Insomnia disorder 10/09/2016  . Moderate persistent asthma-  09/28/2016  . Allergic rhinoconjunctivitis 09/28/2016  . Dysphagia 09/28/2016  . Constipation 01/03/2015  . Internal bleeding hemorrhoids 01/03/2015  . Anal bleeding 01/03/2015    Current Outpatient Medications on File Prior to Visit  Medication Sig Dispense Refill  . albuterol (PROAIR HFA) 108 (90 Base) MCG/ACT inhaler Inhale 2 puffs into the lungs every 4 (four) hours as needed for wheezing or shortness of breath. 1 Inhaler 1  . beclomethasone (QVAR REDIHALER) 80 MCG/ACT inhaler Inhale 2 puffs into the lungs daily. 1 Inhaler 5  . benztropine (COGENTIN) 0.5 MG tablet Take 1 tablet (0.5 mg total) by mouth 2 (two) times daily. 60 tablet 0  . Cetirizine HCl 10 MG CAPS Take 10 mg by mouth daily as needed.    Marland Kitchen. HYDROcodone-acetaminophen (NORCO) 10-325 MG tablet   0  . hydrOXYzine (ATARAX/VISTARIL) 25 MG tablet hydroxyzine HCl 25 mg tablet    . mirtazapine (REMERON) 15 MG tablet Take 1 tablet (15 mg total) by mouth at bedtime. 30 tablet 0  . montelukast (SINGULAIR) 5 MG chewable tablet CHEW 2 TABLETS BY MOUTH EVERY EVENING 60 tablet 1  . OSCIMIN 0.125 MG SUBL PLACE 1 TABLET UNDER TONGUE EVERY 6 HOURS AS NEEDED 60 each 3   . risperiDONE (RISPERDAL) 3 MG tablet Take 1 tablet (3 mg total) by mouth at bedtime. 30 tablet 0   No current facility-administered medications on file prior to visit.     No Known Allergies  Objective: There were no vitals filed for this visit.  General: No acute distress, AAOx3  Right foot:Surgical sites well healed, no swelling to right foot, no erythema, no warmth, no drainage, no signs of infection noted, Capillary fill time <3 seconds in all digits, gross sensation present via light touch to right foot. No pain or crepitation with range of motion right foot.  No pain with calf compression.    Assessment and Plan:  Problem List Items Addressed This Visit    None    Visit Diagnoses    Hallux valgus of right foot    -  Primary   Metatarsalgia of right foot       Longitudinal deficiency of metatarsal bone       Capsulitis       Right foot pain       S/P foot surgery, right          -Patient seen and evaluated -Dispensed toe spacer for right -Continue with good supportive shoes daily  -May continue with scar creams to incision sites and gentle range of motion exercises  -May resume normal activities  -Discharged from post op care -Return as needed. In the meantime, patient to call office if any issues or problems arise.   Asencion Islamitorya Kalijah Westfall, DPM

## 2018-07-06 MED FILL — MONTELUKAST SOD 5 MG TAB CH: 5 | 30 days supply | Qty: 60 | Fill #1

## 2018-07-19 MED FILL — risperiDONE 3 MG TABS: 3 | 15 days supply | Qty: 30 | Fill #1

## 2018-07-19 MED FILL — MIRTAZAPINE 15 MG TABLET: 15 | 30 days supply | Qty: 30 | Fill #1

## 2018-07-20 ENCOUNTER — Other Ambulatory Visit: Payer: Self-pay

## 2018-07-22 ENCOUNTER — Ambulatory Visit: Payer: Self-pay | Admitting: Family Medicine

## 2018-08-04 MED FILL — BENZTROPINE MES 0.5 MG TAB: 0.5 | 30 days supply | Qty: 60 | Fill #3

## 2018-08-12 ENCOUNTER — Other Ambulatory Visit: Payer: Self-pay | Admitting: Family Medicine

## 2018-08-12 DIAGNOSIS — J4540 Moderate persistent asthma, uncomplicated: Secondary | ICD-10-CM

## 2018-08-15 MED FILL — MONTELUKAST SOD 5 MG TAB CH: 5 | 30 days supply | Qty: 60 | Fill #0

## 2018-08-17 MED FILL — risperiDONE 3 MG TABS: 3 | 30 days supply | Qty: 30 | Fill #0

## 2018-08-17 MED FILL — MIRTAZAPINE 15 MG TABLET: 15 | 30 days supply | Qty: 30 | Fill #2

## 2018-08-23 ENCOUNTER — Other Ambulatory Visit: Payer: No Typology Code available for payment source

## 2018-08-23 DIAGNOSIS — J4540 Moderate persistent asthma, uncomplicated: Secondary | ICD-10-CM

## 2018-08-23 DIAGNOSIS — Z0000 Encounter for general adult medical examination without abnormal findings: Secondary | ICD-10-CM

## 2018-08-24 LAB — COMPREHENSIVE METABOLIC PANEL
ALBUMIN: 3.7 g/dL (ref 3.5–5.5)
ALT: 6 IU/L (ref 0–32)
AST: 11 IU/L (ref 0–40)
Albumin/Globulin Ratio: 1.4 (ref 1.2–2.2)
Alkaline Phosphatase: 81 IU/L (ref 39–117)
BUN/Creatinine Ratio: 8 — ABNORMAL LOW (ref 9–23)
BUN: 8 mg/dL (ref 6–24)
Bilirubin Total: 0.2 mg/dL (ref 0.0–1.2)
CALCIUM: 9 mg/dL (ref 8.7–10.2)
CO2: 21 mmol/L (ref 20–29)
Chloride: 106 mmol/L (ref 96–106)
Creatinine, Ser: 0.95 mg/dL (ref 0.57–1.00)
GFR, EST AFRICAN AMERICAN: 81 mL/min/{1.73_m2} (ref >59)
GFR, EST NON AFRICAN AMERICAN: 71 mL/min/{1.73_m2} (ref >59)
GLUCOSE: 100 mg/dL — AB (ref 65–99)
Globulin, Total: 2.7 g/dL (ref 1.5–4.5)
Potassium: 4.4 mmol/L (ref 3.5–5.2)
Sodium: 142 mmol/L (ref 134–144)
TOTAL PROTEIN: 6.4 g/dL (ref 6.0–8.5)

## 2018-08-24 LAB — LIPID PANEL
CHOLESTEROL TOTAL: 186 mg/dL (ref 100–199)
Chol/HDL Ratio: 4.8 ratio — ABNORMAL HIGH (ref 0.0–4.4)
HDL: 39 mg/dL — ABNORMAL LOW (ref >39)
LDL Calculated: 126 mg/dL — ABNORMAL HIGH (ref 0–99)
TRIGLYCERIDES: 105 mg/dL (ref 0–149)
VLDL CHOLESTEROL CAL: 21 mg/dL (ref 5–40)

## 2018-08-24 LAB — VITAMIN D 25 HYDROXY (VIT D DEFICIENCY, FRACTURES): Vit D, 25-Hydroxy: 24.9 ng/mL — ABNORMAL LOW (ref 30.0–100.0)

## 2018-08-24 LAB — CBC WITH DIFFERENTIAL/PLATELET
BASOS: 0 %
Basophils Absolute: 0 10*3/uL (ref 0.0–0.2)
EOS (ABSOLUTE): 0.4 10*3/uL (ref 0.0–0.4)
Eos: 7 %
HEMATOCRIT: 37.2 % (ref 34.0–46.6)
HEMOGLOBIN: 12.6 g/dL (ref 11.1–15.9)
IMMATURE GRANS (ABS): 0 10*3/uL (ref 0.0–0.1)
Immature Granulocytes: 0 %
LYMPHS: 36 %
Lymphocytes Absolute: 1.9 10*3/uL (ref 0.7–3.1)
MCH: 28.9 pg (ref 26.6–33.0)
MCHC: 33.9 g/dL (ref 31.5–35.7)
MCV: 85 fL (ref 79–97)
MONOCYTES: 6 %
Monocytes Absolute: 0.3 10*3/uL (ref 0.1–0.9)
NEUTROS ABS: 2.7 10*3/uL (ref 1.4–7.0)
Neutrophils: 51 %
Platelets: 314 10*3/uL (ref 150–450)
RBC: 4.36 x10E6/uL (ref 3.77–5.28)
RDW: 13.8 % (ref 12.3–15.4)
WBC: 5.4 10*3/uL (ref 3.4–10.8)

## 2018-08-24 LAB — TSH: TSH: 0.928 u[IU]/mL (ref 0.450–4.500)

## 2018-08-24 LAB — HEMOGLOBIN A1C
ESTIMATED AVERAGE GLUCOSE: 111 mg/dL
Hgb A1c MFr Bld: 5.5 % (ref 4.8–5.6)

## 2018-08-24 LAB — HIV ANTIBODY (ROUTINE TESTING W REFLEX): HIV SCREEN 4TH GENERATION: NONREACTIVE

## 2018-08-24 LAB — T4, FREE: Free T4: 1.15 ng/dL (ref 0.82–1.77)

## 2018-08-24 LAB — HEPATITIS C ANTIBODY: HEP C VIRUS AB: 0.1 {s_co_ratio} (ref 0.0–0.9)

## 2018-08-25 ENCOUNTER — Ambulatory Visit (INDEPENDENT_AMBULATORY_CARE_PROVIDER_SITE_OTHER): Payer: No Typology Code available for payment source | Admitting: Family Medicine

## 2018-08-25 ENCOUNTER — Encounter: Payer: Self-pay | Admitting: Family Medicine

## 2018-08-25 VITALS — BP 114/81 | HR 84 | Ht 63.0 in | Wt 134.0 lb

## 2018-08-25 DIAGNOSIS — E785 Hyperlipidemia, unspecified: Secondary | ICD-10-CM | POA: Diagnosis not present

## 2018-08-25 DIAGNOSIS — Z719 Counseling, unspecified: Secondary | ICD-10-CM | POA: Diagnosis not present

## 2018-08-25 DIAGNOSIS — Z23 Encounter for immunization: Secondary | ICD-10-CM | POA: Diagnosis not present

## 2018-08-25 DIAGNOSIS — E786 Lipoprotein deficiency: Secondary | ICD-10-CM | POA: Insufficient documentation

## 2018-08-25 DIAGNOSIS — F315 Bipolar disorder, current episode depressed, severe, with psychotic features: Secondary | ICD-10-CM

## 2018-08-25 DIAGNOSIS — F5101 Primary insomnia: Secondary | ICD-10-CM

## 2018-08-25 DIAGNOSIS — J4540 Moderate persistent asthma, uncomplicated: Secondary | ICD-10-CM

## 2018-08-25 DIAGNOSIS — E559 Vitamin D deficiency, unspecified: Secondary | ICD-10-CM

## 2018-08-25 DIAGNOSIS — E782 Mixed hyperlipidemia: Secondary | ICD-10-CM | POA: Insufficient documentation

## 2018-08-25 MED ORDER — CHOLECALCIFEROL 125 MCG (5000 UT) PO CAPS: 5000.0000 [IU] | ORAL_CAPSULE | Freq: Every day | ORAL | Status: DC

## 2018-08-25 NOTE — Progress Notes (Signed)
Assessment and plan:  1. Hyperlipidemia, unspecified hyperlipidemia type   2. Low HDL (under 40)   3. Health education/counseling   4. Moderate persistent asthma-    5. Primary insomnia   6. Bipolar disorder, curr episode depressed, severe, w/psychotic features (HCC)   7. Need for Tdap vaccination   8. Vitamin D deficiency     - Reviewed recent lab work (08/23/2018) in depth with patient today.  All lab work within normal limits unless otherwise noted.  1. Vitamin D Deficiency = 24.9 - Reviewed goal range as 50-60. - Recommended beginning Vitamin D supplementation. - Begin 5000 IU's OTC daily.  - Re-check in 6 months.  2. Hyperlipidemia Triglycerides = 105 HDL = 39, low LDL = 126, elevated  - Dietary changes such as low saturated & trans fat and low carb/ ketogenic diets discussed with patient.  Encouraged regular exercise and weight loss when appropriate.   - Educational handouts provided at patient's desire.  The 10-year ASCVD risk score [name], et al., 2013) is: 1.3%   Values used to calculate the score:     Age: [age] years     Sex: Female     Is Non-Hispanic African American: No     Diabetic: No     Tobacco smoker: No     Systolic Blood Pressure: 114 mmHg     Is BP treated: No     HDL Cholesterol: 39 mg/dL      Total Cholesterol: 186 mg/dL  - Re-check cholesterol in one year.  3. Asthma - Stable at this time, managed on Qvar and Singular. - Encouraged patient to continue using inhaler rarely.  4. Bipolar Mood Disorder - Sx stable at this time, managed by psychiatry. - Encouraged patient to continue following up with specialty management.  5. Insomnia - Stable at this time, managed by psychiatry. - Encouraged patient to continue following up with specialty management.  6. BMI Counseling Explained to patient what BMI refers to, and what it means medically.    Told patient to think  about it as a "medical risk stratification measurement" and how increasing BMI is associated with increasing risk/ or worsening state of various diseases such as hypertension, hyperlipidemia, diabetes, premature OA, depression etc.  American Heart Association guidelines for healthy diet, basically Mediterranean diet, and exercise guidelines of 30 minutes 5 days per week or more discussed in detail.  Health counseling performed.  All questions answered.  7. Lifestyle & Preventative Health Maintenance - Advised patient to continue working toward exercising to improve overall mental, physical, and emotional health.    - Encouraged patient to engage in daily physical activity, especially a formal exercise routine.  Recommended that the patient eventually strive for at least 150 minutes of moderate cardiovascular activity per week according to guidelines established by the [facility].   - Healthy dietary habits encouraged, including low-carb, and high amounts of lean protein in diet.   - Patient should also consume adequate amounts of water - half of body weight in oz of water per day.   Education and routine counseling performed. Handouts provided.  8. Follow-Up - Return for regularly scheduled chronic follow-up. - Re-check Vitamin D in 6 months. - Re-check cholesterol in 1 year.  - Otherwise, patient knows to call or return to clinic PRN for acute concerns, or as needed to address other health goals.   Orders Placed This Encounter  Procedures  . Tdap vaccine greater than or equal  to 7yo IM    Meds ordered this encounter  Medications  . Cholecalciferol (EQL VITAMIN D3) 5000 units capsule    Sig: Take 1 capsule (5,000 Units total) by mouth daily.     Return for 78mo for chronic f/up asthma, chol, exercise, low hdl.   Anticipatory guidance and routine counseling done re: condition, txmnt options and need for follow up. All questions of patient's were answered.   Gross side effects, risk  and benefits, and alternatives of medications discussed with patient.  Patient is aware that all medications have potential side effects and we are unable to predict every sideeffect or drug-drug interaction that may occur.  Expresses verbal understanding and consents to current therapy plan and treatment regiment.  Please see AVS handed out to patient at the end of our visit for additional patient instructions/ counseling done pertaining to today's office visit.  Note: This document was prepared using Dragon voice recognition software and may include unintentional dictation errors.  This document serves as a record of services personally performed by [name], DO. It was created on her behalf by Peggye Fothergill, a trained medical scribe. The creation of this record is based on the scribe's personal observations and the provider's statements to them.   I have reviewed the above medical documentation for accuracy and completeness and I concur.  [name] 08/26/18 2:59 PM   ----------------------------------------------------------------------------------------------------------------------  Subjective:   CC:   Shelia Ramos is a 47 y.o. female who presents to [facility] today for review and discussion of recent bloodwork that was done.  1. All recent blood work that we ordered was reviewed with patient today.  Patient was counseled on all abnormalities and we discussed dietary and lifestyle changes that could help those values (also medications when appropriate).  Extensive health counseling performed and all patient's concerns/ questions were addressed.   Patient has been a little tired lately.  Admits she is not exercising at all lately.  Notes her diet is so-so, probably unhealthy.  Notes she doesn't drink water and she doesn't take vitamins.  Mood & Insomnia Has some good nights, some bad nights with her insomnia.  Sees NP Donnie Aho; sees counselor for assistance with mood every 3-4 months.  Asthma  Notes sometimes she is stuffy at night time, but the Singulair helps.  She denies wheezing and denies having to use her albuterol.  Reports no other issues.  Denies issues with her hemorrhoids.   Wt Readings from Last 3 Encounters:  08/25/18 134 lb (60.8 kg)  05/26/18 134 lb 6.4 oz (61 kg)  08/13/17 131 lb 12.8 oz (59.8 kg)   BP Readings from Last 3 Encounters:  08/25/18 114/81  05/26/18 116/78  03/01/18 (!) 94/49   Pulse Readings from Last 3 Encounters:  08/25/18 84  05/26/18 (!) 108  03/01/18 (!) 112   BMI Readings from Last 3 Encounters:  08/25/18 23.74 kg/m  05/26/18 23.81 kg/m  08/13/17 24.11 kg/m     Patient Care Team    Relationship Specialty Notifications Start End  [name], DO PCP - General Family Medicine  09/28/16   Napoleon Form, MD Consulting Physician Gastroenterology  10/09/16   Asencion Islam, DPM Consulting Physician Podiatry  10/09/16   Carrington Clamp, MD Consulting Physician Obstetrics and Gynecology  10/09/16   Marcelyn Bruins, MD Consulting Physician Allergy  10/09/16   Lynden Ang, MD Referring Physician Psychiatry  10/09/16    Comment: she actually  sees Oneta Rack, NP who manages her medications    Full medical history updated and reviewed in the office today  Patient Active Problem List   Diagnosis Date Noted  . Hyperlipidemia 08/25/2018  . Low HDL (under 40) 08/25/2018  . Health education/counseling 06/02/2018  . Bipolar disorder, curr episode depressed, severe, w/psychotic features (HCC) 05/03/2017  . Bunion of great toe of left foot 10/09/2016  . IBS (irritable bowel syndrome) 10/09/2016  . Perimenopause- on Xulane 10/09/2016  . Insomnia disorder 10/09/2016  . Moderate persistent asthma-  09/28/2016  . Allergic rhinoconjunctivitis 09/28/2016  . Dysphagia 09/28/2016  . Constipation 01/03/2015  . Internal bleeding hemorrhoids  01/03/2015  . Anal bleeding 01/03/2015    Past Medical History:  Diagnosis Date  . Allergy    SEASONAL  . Anxiety   . Asthma    AS A CHILD  . Depression   . IBS (irritable bowel syndrome)   . Seasonal allergies     Past Surgical History:  Procedure Laterality Date  . BUNIONECTOMY Right   . COLPOSCOPY      Social History   Tobacco Use  . Smoking status: Never Smoker  . Smokeless tobacco: Never Used  Substance Use Topics  . Alcohol use: No    Alcohol/week: 0.0 standard drinks    Family Hx: Family History  Problem Relation Age of Onset  . Anxiety disorder Mother   . Depression Mother   . Allergic rhinitis Father   . Allergic rhinitis Paternal Aunt   . Allergic rhinitis Paternal Grandmother   . Cancer Paternal Grandmother        stomach  . Heart attack Maternal Grandfather   . Angioedema Neg Hx   . Asthma Neg Hx   . Atopy Neg Hx   . Eczema Neg Hx   . Immunodeficiency Neg Hx   . Urticaria Neg Hx      Medications: Current Outpatient Medications  Medication Sig Dispense Refill  . albuterol (PROAIR HFA) 108 (90 Base) MCG/ACT inhaler Inhale 2 puffs into the lungs every 4 (four) hours as needed for wheezing or shortness of breath. 1 Inhaler 1  . beclomethasone (QVAR REDIHALER) 80 MCG/ACT inhaler Inhale 2 puffs into the lungs daily. 1 Inhaler 5  . benztropine (COGENTIN) 0.5 MG tablet Take 1 tablet (0.5 mg total) by mouth 2 (two) times daily. 60 tablet 0  . Cetirizine HCl 10 MG CAPS Take 10 mg by mouth daily as needed.    . hydrOXYzine (ATARAX/VISTARIL) 25 MG tablet hydroxyzine HCl 25 mg tablet    . mirtazapine (REMERON) 15 MG tablet Take 1 tablet (15 mg total) by mouth at bedtime. 30 tablet 0  . montelukast (SINGULAIR) 5 MG chewable tablet CHEW 2 TABLETS BY MOUTH EVERY EVENING 60 tablet 1  . OSCIMIN 0.125 MG SUBL PLACE 1 TABLET UNDER TONGUE EVERY 6 HOURS AS NEEDED 60 each 3  . risperiDONE (RISPERDAL) 3 MG tablet Take 1 tablet (3 mg total) by mouth at bedtime. 30  tablet 0  . Cholecalciferol (EQL VITAMIN D3) 5000 units capsule Take 1 capsule (5,000 Units total) by mouth daily.     No current facility-administered medications for this visit.     Allergies:  No Known Allergies   Review of Systems: General:   No F/C, wt loss Pulm:   No DIB, SOB, pleuritic chest pain Card:  No CP, palpitations Abd:  No n/v/d or pain Ext:  No inc edema from baseline  Objective:  Blood pressure 114/81, pulse 84, height  5\' 3"  (1.6 m), weight 134 lb (60.8 kg), last menstrual period 07/28/2018, SpO2 98 %. Body mass index is 23.74 kg/m. Gen:   Well NAD, A and O *3 HEENT:    [place]/AT, EOMI,  MMM Lungs:   Normal work of breathing. CTA B/L, no Wh, rhonchi Heart:   RRR, S1, S2 WNL's, no MRG Abd:   No gross distention Exts:    warm, pink,  Brisk capillary refill, warm and well perfused.  Psych:    No HI/SI, judgement and insight good, Euthymic mood. Full Affect.   Recent Results (from the past 2160 hour(s))  Hepatitis C antibody     Status: None   Collection Time: 08/23/18  8:39 AM  Result Value Ref Range   Hep C Virus Ab 0.1 0.0 - 0.9 s/co ratio    Comment:                                   Negative:     < 0.8                              Indeterminate: 0.8 - 0.9                                   Positive:     > 0.9  The CDC recommends that a positive HCV antibody result  be followed up with a HCV Nucleic Acid Amplification  test (161096).   HIV antibody     Status: None   Collection Time: 08/23/18  8:39 AM  Result Value Ref Range   HIV Screen 4th Generation wRfx Non Reactive Non Reactive  T4, free     Status: None   Collection Time: 08/23/18  8:39 AM  Result Value Ref Range   Free T4 1.15 0.82 - 1.77 ng/dL  VITAMIN D 25 Hydroxy (Vit-D Deficiency, Fractures)     Status: Abnormal   Collection Time: 08/23/18  8:39 AM  Result Value Ref Range   Vit D, 25-Hydroxy 24.9 (L) 30.0 - 100.0 ng/mL    Comment: Vitamin D deficiency has been defined by the Institute  of Medicine and an Endocrine Society practice guideline as a level of serum 25-OH vitamin D less than 20 ng/mL (1,2). The Endocrine Society went on to further define vitamin D insufficiency as a level between 21 and 29 ng/mL (2). 1. IOM (Institute of Medicine). 2010. Dietary reference    intakes for calcium and D. [place]: The    [employer]. 2. Holick MF, Binkley [place], Bischoff-Ferrari HA, et al.    Evaluation, treatment, and prevention of vitamin D    deficiency: an Endocrine Society clinical practice    guideline. JCEM. 2011 Jul; 96(7):1911-30.   TSH     Status: None   Collection Time: 08/23/18  8:39 AM  Result Value Ref Range   TSH 0.928 0.450 - 4.500 uIU/mL  Lipid panel     Status: Abnormal   Collection Time: 08/23/18  8:39 AM  Result Value Ref Range   Cholesterol, Total 186 100 - 199 mg/dL   Triglycerides 045 0 - 149 mg/dL   HDL 39 (L) >40 mg/dL   VLDL Cholesterol Cal 21 5 - 40 mg/dL   LDL Calculated 981 (H) 0 - 99 mg/dL   Chol/HDL Ratio  4.8 (H) 0.0 - 4.4 ratio    Comment:                                   T. Chol/HDL Ratio                                             Men  Women                               1/2 Avg.Risk  3.4    3.3                                   Avg.Risk  5.0    4.4                                2X Avg.Risk  9.6    7.1                                3X Avg.Risk 23.4   11.0   Hemoglobin A1c     Status: None   Collection Time: 08/23/18  8:39 AM  Result Value Ref Range   Hgb A1c MFr Bld 5.5 4.8 - 5.6 %    Comment:          Prediabetes: 5.7 - 6.4          Diabetes: >6.4          Glycemic control for adults with diabetes: <7.0    Est. average glucose Bld gHb Est-mCnc 111 mg/dL  Comprehensive metabolic panel     Status: Abnormal   Collection Time: 08/23/18  8:39 AM  Result Value Ref Range   Glucose 100 (H) 65 - 99 mg/dL   BUN 8 6 - 24 mg/dL   Creatinine, Ser 1.61 0.57 - 1.00 mg/dL   GFR calc non Af Amer 71 >59 mL/min/1.73   GFR  calc Af Amer 81 >59 mL/min/1.73   BUN/Creatinine Ratio 8 (L) 9 - 23   Sodium 142 134 - 144 mmol/L   Potassium 4.4 3.5 - 5.2 mmol/L   Chloride 106 96 - 106 mmol/L   CO2 21 20 - 29 mmol/L   Calcium 9.0 8.7 - 10.2 mg/dL   Total Protein 6.4 6.0 - 8.5 g/dL   Albumin 3.7 3.5 - 5.5 g/dL   Globulin, Total 2.7 1.5 - 4.5 g/dL   Albumin/Globulin Ratio 1.4 1.2 - 2.2   Bilirubin Total 0.2 0.0 - 1.2 mg/dL   Alkaline Phosphatase 81 39 - 117 IU/L   AST 11 0 - 40 IU/L   ALT 6 0 - 32 IU/L  CBC with Differential/Platelet     Status: None   Collection Time: 08/23/18  8:39 AM  Result Value Ref Range   WBC 5.4 3.4 - 10.8 x10E3/uL   RBC 4.36 3.77 - 5.28 x10E6/uL   Hemoglobin 12.6 11.1 - 15.9 g/dL   Hematocrit 09.6 04.5 - 46.6 %   MCV 85 79 - 97 fL   MCH 28.9 26.6 - 33.0 pg   MCHC 33.9 31.5 -  35.7 g/dL   RDW 16.1 09.6 - 04.5 %   Platelets 314 150 - 450 x10E3/uL   Neutrophils 51 Not Estab. %   Lymphs 36 Not Estab. %   Monocytes 6 Not Estab. %   Eos 7 Not Estab. %   Basos 0 Not Estab. %   Neutrophils Absolute 2.7 1.4 - 7.0 x10E3/uL   Lymphocytes Absolute 1.9 0.7 - 3.1 x10E3/uL   Monocytes Absolute 0.3 0.1 - 0.9 x10E3/uL   EOS (ABSOLUTE) 0.4 0.0 - 0.4 x10E3/uL   Basophils Absolute 0.0 0.0 - 0.2 x10E3/uL   Immature Granulocytes 0 Not Estab. %   Immature Grans (Abs) 0.0 0.0 - 0.1 x10E3/uL

## 2018-08-25 NOTE — Patient Instructions (Addendum)
Goal is to move at least 30 minutes 5 days/week     Nine ways to increase your "good" HDL cholesterol  High-density lipoprotein (HDL) is often referred to as the "good" cholesterol. Having high HDL levels helps carry cholesterol from your arteries to your liver, where it can be used or excreted.  Having high levels of HDL also has antioxidant and anti-inflammatory effects, and is linked to a reduced risk of heart disease (1, 2).  Most health experts recommend minimum blood levels of 40 mg/dl in men and 50 mg/dl in women.  While genetics definitely play a role, there are several other factors that affect HDL levels.  Here are nine healthy ways to raise your "good" HDL cholesterol.  1. Consume olive oil  two pieces of salmon on a plate olive oil being poured into a small dish Extra virgin olive oil may be more healthful than processed olive oils. Olive oil is one of the healthiest fats around.  A large analysis of 42 studies with more than 800,000 participants found that olive oil was the only source of monounsaturated fat that seemed to reduce heart disease risk (3).  Research has shown that one of olive oil's heart-healthy effects is an increase in HDL cholesterol. This effect is thought to be caused by antioxidants it contains called polyphenols (4, 5, 6, 7).  Extra virgin olive oil has more polyphenols than more processed olive oils, although the amount can still vary among different types and brands.  One study gave 200 healthy young men about 2 tablespoons (25 ml) of different olive oils per day for three weeks.  The researchers found that participants' HDL levels increased significantly more after they consumed the olive oil with the highest polyphenol content (6).  In another study, when 64 older adults consumed about 4 tablespoons (50 ml) of high-polyphenol extra virgin olive oil every day for six weeks, their HDL cholesterol increased by 6.5 mg/dl, on average (7).  In  addition to raising HDL levels, olive oil has been found to boost HDL's anti-inflammatory and antioxidant function in studies of older people and individuals with high cholesterol levels ( 7, 8, 9).  Whenever possible, select high-quality, certified extra virgin olive oils, which tend to be highest in polyphenols.  Bottom line: Extra virgin olive oil with a high polyphenol content has been shown to increase HDL levels in healthy people, the elderly and individuals with high cholesterol.  2. Follow a low-carb or ketogenic diet  Low-carb and ketogenic diets provide a number of health benefits, including weight loss and reduced blood sugar levels.  They have also been shown to increase HDL cholesterol in people who tend to have lower levels.  This includes those who are obese, insulin resistant or diabetic (10, 11, 12, 13, 14, 15, 16, 17).  In one study, people with type 2 diabetes were split into two groups.  One followed a diet consuming less than 50 grams of carbs per day. The other followed a high-carb diet.  Although both groups lost weight, the low-carb group's HDL cholesterol increased almost twice as much as the high-carb group's did (14).  In another study, obese people who followed a low-carb diet experienced an increase in HDL cholesterol of 5 mg/dl overall.  Meanwhile, in the same study, the participants who ate a low-fat, high-carb diet showed a decrease in HDL cholesterol (15).  This response may partially be due to the higher levels of fat people typically consume on low-carb diets.  One study  in overweight women found that diets high in meat and cheese increased HDL levels by 5-8%, compared to a higher-carb diet (18).  What's more, in addition to raising HDL cholesterol, very-low-carb diets have been shown to decrease triglycerides and improve several other risk factors for heart disease (13, 14, 16, 17).  Bottom line: Low-carb and ketogenic diets typically increase HDL  cholesterol levels in people with diabetes, metabolic syndrome and obesity.  3. Exercise regularly  Being physically active is important for heart health.  Studies have shown that many different types of exercise are effective at raising HDL cholesterol, including strength training, high-intensity exercise and aerobic exercise (19, 20, 21, 22, 23, 24).  However, the biggest increases in HDL are typically seen with high-intensity exercise.  One small study followed women who were living with polycystic ovary syndrome (PCOS), which is linked to a higher risk of insulin resistance. The study required them to perform high-intensity exercise three times a week.  The exercise led to an increase in HDL cholesterol of 8 mg/dL after 10 weeks. The women also showed improvements in other health markers, including decreased insulin resistance and improved arterial function (23).  In a 12-week study, overweight men who performed high-intensity exercise experienced a 10% increase in HDL cholesterol.  In contrast, the low-intensity exercise group showed only a 2% increase and the endurance training group experienced no change (24).  However, even lower-intensity exercise seems to increase HDL's anti-inflammatory and antioxidant capacities, whether or not HDL levels change (20, 21, 25).  Overall, high-intensity exercise such as high-intensity interval training (HIIT) and high-intensity circuit training (HICT) may boost HDL cholesterol levels the most.  Bottom line: Exercising several times per week can help raise HDL cholesterol and enhance its anti-inflammatory and antioxidant effects. High-intensity forms of exercise may be especially effective.  4. Add coconut oil to your diet  Studies have shown that coconut oil may reduce appetite, increase metabolic rate and help protect brain health, among other benefits.  Some people may be concerned about coconut oil's effects on heart health due to its high  saturated fat content.  However, it appears that coconut oil is actually quite heart healthy.  Coconut oil tends to raise HDL cholesterol more than many other types of fat.  In addition, it may improve the ratio of low-density-lipoprotein (LDL) cholesterol, the "bad" cholesterol, to HDL cholesterol. Improving this ratio reduces heart disease risk (26, 27, 28, 29).  One study examined the health effects of coconut oil on 40 women with excess belly fat. The researchers found that participants who took coconut oil daily experienced increased HDL cholesterol and a lower LDL-to-HDL ratio.  In contrast, the group who took soybean oil daily had a decrease in HDL cholesterol and an increase in the LDL-to-HDL ratio (29).  Most studies have found these health benefits occur at a dosage of about 2 tablespoons (30 ml) of coconut oil per day. It's best to incorporate this into cooking rather than eating spoonfuls of coconut oil on their own.  Bottom line: Consuming 2 tablespoons (30 ml) of coconut oil per day may help increase HDL cholesterol levels.  5. Stop smoking  cigarette butt Quitting smoking can reduce the risk of heart disease and lung cancer. Smoking increases the risk of many health problems, including heart disease and lung cancer (30).  One of its negative effects is a suppression of HDL cholesterol.  Some studies have found that quitting smoking can increase HDL levels. Indeed, one study found no significant differences  in HDL levels between former smokers and people who had never smoked (31, 32, 33, 34, 35).  In a one-year study of more than 1,500 people, those who quit smoking had twice the increase in HDL as those who resumed smoking within the year. The number of large HDL particles also increased, which further reduced heart disease risk (32).  One study followed smokers who switched from traditional cigarettes to electronic cigarettes for one year. They found that the switch was  associated with an increase in HDL cholesterol of 5 mg/dl, on average (33).  When it comes to the effect of nicotine replacement patches on HDL levels, research results have been mixed.  One study found that nicotine replacement therapy led to higher HDL cholesterol. However, other research suggests that people who use nicotine patches likely won't see increases in HDL levels until after replacement therapy is completed (34, 36).  Even in studies where HDL cholesterol levels didn't increase after people quit smoking, HDL function improved, resulting in less inflammation and other beneficial effects on heart health (37).  Bottom line: Quitting smoking can increase HDL levels, improve HDL function and help protect heart health.  6. Lose weight  When overweight and obese people lose weight, their HDL cholesterol levels usually increase.  What's more, this benefit seems to occur whether weight loss is achieved by calorie counting, carb restriction, intermittent fasting, weight loss surgery or a combination of diet and exercise (16, 38, 39, 40, 41, 42).  One study examined HDL levels in more than 3,000 overweight and obese [place] adults who followed a lifestyle modification program for one year.  The researchers found that losing at least 6.6 lbs (3 kg) led to an increase in HDL cholesterol of 4 mg/dl, on average (41).  In another study, when obese people with type 2 diabetes consumed calorie-restricted diets that provided 20-30% of calories from protein, they experienced significant increases in HDL cholesterol levels (42).  The key to achieving and maintaining healthy HDL cholesterol levels is choosing the type of diet that makes it easiest for you to lose weight and keep it off.  Bottom Line: Several methods of weight loss have been shown to increase HDL cholesterol levels in people who are overweight or obese.  7. Choose purple produce  Consuming purple-colored fruits and vegetables is a  delicious way to potentially increase HDL cholesterol.  Purple produce contains antioxidants known as anthocyanins.  Studies using anthocyanin extracts have shown that they help fight inflammation, protect your cells from damaging free radicals and may also raise HDL cholesterol levels (43, 44, 45, 46).  In a 24-week study of 58 people with diabetes, those who took an anthocyanin supplement twice a day experienced a 19% increase in HDL cholesterol, on average, along with other improvements in heart health markers (45).  In another study, when people with cholesterol issues took anthocyanin extract for 12 weeks, their HDL cholesterol levels increased by 13.7% (46).  Although these studies used extracts instead of foods, there are several fruits and vegetables that are very high in anthocyanins. These include eggplant, purple corn, red cabbage, blueberries, blackberries and black raspberries.  Bottom line: Consuming fruits and vegetables rich in anthocyanins may help increase HDL cholesterol levels.  8. Eat fatty fish often  The omega-3 fats in fatty fish provide major benefits to heart health, including a reduction in inflammation and better functioning of the cells that line your arteries (47, 48).  There's some research showing that eating fatty fish or taking  fish oil may also help raise low levels of HDL cholesterol (49, 50, 51, 52, 53).  In a study of 33 heart disease patients, participants that consumed fatty fish four times per week experienced an increase in HDL cholesterol levels. The particle size of their HDL also increased (52).  In another study, overweight men who consumed herring five days a week for six weeks had a 5% increase in HDL cholesterol, compared with their levels after eating lean pork and chicken five days a week (53).  However, there are a few studies that found no increase in HDL cholesterol in response to increased fish or omega-3 supplement intake (54, 55).  In  addition to herring, other types of fatty fish that may help raise HDL cholesterol include salmon, sardines, mackerel and anchovies.  Bottom line: Eating fatty fish several times per week may help increase HDL cholesterol levels and provide other benefits to heart health.  9. Avoid artificial trans fats  Artificial trans fats have many negative health effects due to their inflammatory properties (56, 57).  There are two types of trans fats. One kind occurs naturally in animal products, including full-fat dairy.  In contrast, the artificial trans fats found in margarines and processed foods are created by adding hydrogen to unsaturated vegetable and seed oils. These fats are also known as industrial trans fats or partially hydrogenated fats.  Research has shown that, in addition to increasing inflammation and contributing to several health problems, these artificial trans fats may lower HDL cholesterol levels.  In one study, researchers compared how people's HDL levels responded when they consumed different margarines.  The study found that participants' HDL cholesterol levels were 10% lower after consuming margarine containing partially hydrogenated soybean oil, compared to their levels after consuming palm oil (58).  Another controlled study followed 40 adults who had diets high in different types of trans fats.  They found that HDL cholesterol levels in women were significantly lower after they consumed the diet high in industrial trans fats, compared to the diet containing naturally occurring trans fats (59).  To protect heart health and keep HDL cholesterol in the healthy range, it's best to avoid artificial trans fats altogether.  Bottom line: Artificial trans fats have been shown to lower HDL levels and increase inflammation, compared to other fats.  Take home message  Although your HDL cholesterol levels are partly determined by your genetics, there are many things you can do to  naturally increase your own levels.  Fortunately, the practices that raise HDL cholesterol often provide other health benefits as well.

## 2018-09-06 ENCOUNTER — Encounter: Payer: Self-pay | Admitting: Family Medicine

## 2018-09-15 MED FILL — risperiDONE 3 MG TABS: 3 | 30 days supply | Qty: 30 | Fill #1

## 2018-09-15 MED FILL — MIRTAZAPINE 15 MG TABLET: 15 | 30 days supply | Qty: 30 | Fill #3

## 2018-09-15 MED FILL — MONTELUKAST SOD 5 MG TAB CH: 5 | 30 days supply | Qty: 60 | Fill #1

## 2018-10-07 MED FILL — BENZTROPINE MES 0.5 MG TAB: 0.5 | 30 days supply | Qty: 60 | Fill #0

## 2018-10-17 ENCOUNTER — Other Ambulatory Visit: Payer: Self-pay | Admitting: Family Medicine

## 2018-10-17 DIAGNOSIS — J4540 Moderate persistent asthma, uncomplicated: Secondary | ICD-10-CM

## 2018-10-28 MED FILL — MIRTAZAPINE 30 MG TABS: 30 | 30 days supply | Qty: 30 | Fill #0

## 2018-10-28 MED FILL — risperiDONE 3 MG TABS: 3 | 30 days supply | Qty: 30 | Fill #0

## 2018-10-28 MED FILL — MONTELUKAST SOD 5 MG TAB CH: 5 | 90 days supply | Qty: 180 | Fill #0

## 2018-11-25 MED FILL — risperiDONE 3 MG TABS: 3 | 30 days supply | Qty: 30 | Fill #1

## 2018-11-25 MED FILL — MIRTAZAPINE 30 MG TABS: 30 | 30 days supply | Qty: 30 | Fill #1

## 2018-11-25 MED FILL — BENZTROPINE MES 0.5 MG TAB: 0.5 | 30 days supply | Qty: 60 | Fill #1

## 2019-01-11 DIAGNOSIS — G4700 Insomnia, unspecified: Secondary | ICD-10-CM | POA: Diagnosis not present

## 2019-01-11 DIAGNOSIS — F411 Generalized anxiety disorder: Secondary | ICD-10-CM | POA: Diagnosis not present

## 2019-01-11 DIAGNOSIS — F319 Bipolar disorder, unspecified: Secondary | ICD-10-CM | POA: Diagnosis not present

## 2019-01-26 MED FILL — risperiDONE 3 MG TABS: 3 | 30 days supply | Qty: 30 | Fill #3

## 2019-01-26 MED FILL — BENZTROPINE MES 0.5 MG TAB: 0.5 | 30 days supply | Qty: 60 | Fill #2

## 2019-01-26 MED FILL — MONTELUKAST SOD 5 MG TAB CH: 5 | 90 days supply | Qty: 180 | Fill #1

## 2019-01-26 MED FILL — MIRTAZAPINE 30 MG TABLET: 30 | 30 days supply | Qty: 30 | Fill #3

## 2019-03-01 MED FILL — hydrOXYzine HCL 25 MG TABS: 25 | 90 days supply | Qty: 180 | Fill #0

## 2019-03-01 MED FILL — MIRTAZAPINE 15 MG TABLET: 15 | 90 days supply | Qty: 90 | Fill #0

## 2019-03-01 MED FILL — risperiDONE 3 MG TABS: 3 | 90 days supply | Qty: 90 | Fill #0

## 2019-03-01 MED FILL — BENZTROPINE MES 0.5 MG TAB: 0.5 | 90 days supply | Qty: 180 | Fill #0

## 2019-05-03 ENCOUNTER — Other Ambulatory Visit: Payer: Self-pay | Admitting: Family Medicine

## 2019-05-03 DIAGNOSIS — J4540 Moderate persistent asthma, uncomplicated: Secondary | ICD-10-CM

## 2019-05-03 MED FILL — MONTELUKAST SOD 5 MG TAB CH: 5 | 15 days supply | Qty: 30 | Fill #0

## 2019-05-10 DIAGNOSIS — F411 Generalized anxiety disorder: Secondary | ICD-10-CM | POA: Diagnosis not present

## 2019-05-10 DIAGNOSIS — G4700 Insomnia, unspecified: Secondary | ICD-10-CM | POA: Diagnosis not present

## 2019-05-10 DIAGNOSIS — F319 Bipolar disorder, unspecified: Secondary | ICD-10-CM | POA: Diagnosis not present

## 2019-05-15 ENCOUNTER — Telehealth: Payer: Self-pay | Admitting: Family Medicine

## 2019-05-15 NOTE — Telephone Encounter (Signed)
-----   Message from [place] Crane, CMA sent at 05/03/2019 10:15 AM EDT ----- Patient is due for follow up, please call the patient to make an appointment.  30 day supply of medication sent in for the patient.  Thanks. MPulliam, CMA/RT(R)

## 2019-05-15 NOTE — Telephone Encounter (Signed)
Left patient message to call office to set provider required OV or Virtual OV for any further Rx refills.  --Forwarding FYI to medical assistant.  -glh

## 2019-05-25 ENCOUNTER — Ambulatory Visit: Payer: No Typology Code available for payment source | Admitting: Family Medicine

## 2019-05-30 ENCOUNTER — Ambulatory Visit (INDEPENDENT_AMBULATORY_CARE_PROVIDER_SITE_OTHER): Payer: No Typology Code available for payment source | Admitting: Family Medicine

## 2019-05-30 ENCOUNTER — Encounter: Payer: Self-pay | Admitting: Family Medicine

## 2019-05-30 ENCOUNTER — Other Ambulatory Visit: Payer: Self-pay

## 2019-05-30 VITALS — Ht 63.0 in | Wt 134.0 lb

## 2019-05-30 DIAGNOSIS — J4540 Moderate persistent asthma, uncomplicated: Secondary | ICD-10-CM

## 2019-05-30 DIAGNOSIS — F315 Bipolar disorder, current episode depressed, severe, with psychotic features: Secondary | ICD-10-CM | POA: Diagnosis not present

## 2019-05-30 DIAGNOSIS — J3089 Other allergic rhinitis: Secondary | ICD-10-CM | POA: Insufficient documentation

## 2019-05-30 DIAGNOSIS — E559 Vitamin D deficiency, unspecified: Secondary | ICD-10-CM | POA: Diagnosis not present

## 2019-05-30 DIAGNOSIS — Z719 Counseling, unspecified: Secondary | ICD-10-CM

## 2019-05-30 MED ORDER — LEVOCETIRIZINE DIHYDROCHLORIDE 2.5 MG/5ML PO SOLN: 5.0000 mg | Freq: Every evening | ORAL | 0 refills | Status: DC

## 2019-05-30 MED ORDER — MONTELUKAST SODIUM 5 MG PO CHEW: CHEWABLE_TABLET | ORAL | 1 refills | Status: DC

## 2019-05-30 MED ORDER — BECLOMETHASONE DIPROP HFA 80 MCG/ACT IN AERB: 2.0000 | INHALATION_SPRAY | Freq: Every day | RESPIRATORY_TRACT | 5 refills | Status: DC

## 2019-05-30 MED ORDER — ALBUTEROL SULFATE HFA 108 (90 BASE) MCG/ACT IN AERS: 2.0000 | INHALATION_SPRAY | RESPIRATORY_TRACT | 1 refills | Status: DC | PRN

## 2019-05-30 MED FILL — BENZTROPINE MES 0.5 MG TAB: 0.5 | 90 days supply | Qty: 180 | Fill #1

## 2019-05-30 MED FILL — hydrOXYzine HCL 25 MG TABS: 25 | 90 days supply | Qty: 180 | Fill #1

## 2019-05-30 MED FILL — MIRTAZAPINE 15 MG TABLET: 15 | 90 days supply | Qty: 90 | Fill #1

## 2019-05-30 MED FILL — risperiDONE 3 MG TABS: 3 | 90 days supply | Qty: 90 | Fill #1

## 2019-05-30 MED FILL — QVAR REDIHALER 80 MCG/ACT A: 80 | 60 days supply | Qty: 11 | Fill #0

## 2019-05-30 MED FILL — ALBUTEROL SULFATE HFA 108 (: 108 (90 BAS | 17 days supply | Qty: 18 | Fill #0

## 2019-05-30 MED FILL — MONTELUKAST SOD 5 MG TAB CH: 5 | 90 days supply | Qty: 180 | Fill #0

## 2019-05-30 NOTE — Progress Notes (Signed)
Telehealth office visit note for Shelia LotDeborah Camani Ramos, D.O- [facility]   I connected with current patient today and verified that I am speaking with the correct person using two identifiers.   . Location of the patient: Home . Location of the provider: Office Only the patient (+/- their family members at pt's discretion) and myself were participating in the encounter    - This visit type was conducted due to national recommendations for restrictions regarding the COVID-19 Pandemic (e.g. social distancing) in an effort to limit this patient's exposure and mitigate transmission in our community.  This format is felt to be most appropriate for this patient at this time.   - The patient did not have access to video technology or had technical difficulties with video requiring transitioning to audio format only. - No physical exam could be performed with this format, beyond that communicated to us by the patient/ family members as noted.   - Additionally my office staff/ schedulers discussed with the patient that there may be a monetary charge related to this service, depending on their medical insurance.   The patient expressed understanding, and agreed to proceed.       History of Present Illness:  Patient was last seen 08/25/2018 and was told to follow-up in 37mo for chronic f/up asthma, chol, exercise, low hdl.   Pt works for [employer]- 1/3 pts in house currently with [employer].   -For her asthma/reactive airway disease: she is on Qvar, albuterol as needed and Singulair daily- sx well controlled.  Ran out of Singulair about 1 week or so--was lost to follow up.  Now coughing more at night, feeling SOB but no acutely ill sx   Seasonal all:  Takes benadryl every day.  She had seen allergist Dr. Delorse LekPadgett in the past.   Patient has not been taken xyzal, or allegra etc.  Vit D:   Patient has not been taking her over-the-counter vitamin D.  Last checked it was 24 back end  of August 2019.     Impression and Recommendations:    1. Moderate persistent asthma, uncomplicated-   not currently well controlled   2. Environmental and seasonal allergies-  not currently well controlled   3. Vitamin D deficiency-  not taking vitamin D supplements per our treatment plan   4. Bipolar disorder, curr episode depressed, severe, w/psychotic features (HCC)   5. Health education/counseling     1) refill Qvar and albuterol.  Patient understands the difference between rescue and chronic daily she also understands goal is for less than 2 times a week of using rescue inhaler. -Start Singulair in hopes for better control, especially since symptoms worse during allergy season  2) stop Benadryl -Avoidance practices discussed with patient and preventative strategies -Start Xyzal - Sinus rinses as needed  3) start taking vitamin D3 5000 IUs daily.  She declines once weekly.  4) mood disorder treated by psychiatry; patient appears stable today.  - As part of my medical decision making, I reviewed the following data within the electronic MEDICAL RECORD NUMBER History obtained from pt /family, CMA notes reviewed and incorporated if applicable, Labs reviewed, Radiograph/ tests reviewed if applicable and OV notes from prior OV's with me, as well as other specialists she/he has seen since seeing me last, were all reviewed and used in my medical decision making process today.   - Additionally, discussion had with patient regarding txmnt plan, and their biases/concerns about  that plan were used in my medical decision making today.   - The patient agreed with the plan and demonstrated an understanding of the instructions.   No barriers to understanding were identified.   - Red flag symptoms and signs discussed in detail.  Patient expressed understanding regarding what to do in case of emergency\ urgent symptoms.  The patient was advised to call back or seek an in-person evaluation if the symptoms  worsen or if the condition fails to improve as anticipated.   Return for CPE and FBW same day end of Aug.    Meds ordered this encounter  Medications  . montelukast (SINGULAIR) 5 MG chewable tablet    Sig: CHEW 2 TABLETS BY MOUTH EVERY EVENING.    Dispense:  180 tablet    Refill:  1  . beclomethasone (QVAR REDIHALER) 80 MCG/ACT inhaler    Sig: Inhale 2 puffs into the lungs daily.    Dispense:  1 Inhaler    Refill:  5  . albuterol (PROAIR HFA) 108 (90 Base) MCG/ACT inhaler    Sig: Inhale 2 puffs into the lungs every 4 (four) hours as needed for wheezing or shortness of breath.    Dispense:  1 Inhaler    Refill:  1  . levocetirizine (XYZAL) 2.5 MG/5ML solution    Sig: Take 10 mLs (5 mg total) by mouth every evening for 30 days.    Dispense:  300 mL    Refill:  0    Medications Discontinued During This Encounter  Medication Reason  . montelukast (SINGULAIR) 5 MG chewable tablet Reorder  . beclomethasone (QVAR REDIHALER) 80 MCG/ACT inhaler Reorder  . albuterol (PROAIR HFA) 108 (90 Base) MCG/ACT inhaler Reorder      I provided 18 minutes of non-face-to-face time during this encounter,with over 50% of the time in direct counseling on patients medical conditions/ medical concerns.  Additional time was spent with charting and coordination of care after the actual visit commenced.   Note:  This note was prepared with assistance of Dragon voice recognition software. Occasional wrong-word or sound-a-like substitutions may have occurred due to the inherent limitations of voice recognition software.  Shelia Lot, DO 05/30/2019 7:32 PM    Patient Care Team    Relationship Specialty Notifications Start End  Shelia Lot, DO PCP - General Family Medicine  09/28/16   Napoleon Form, MD Consulting Physician Gastroenterology  10/09/16   Asencion Islam, DPM Consulting Physician Podiatry  10/09/16   Carrington Clamp, MD Consulting Physician Obstetrics and Gynecology  10/09/16    Marcelyn Bruins, MD Consulting Physician Allergy  10/09/16   Lynden Ang, MD Referring Physician Psychiatry  10/09/16    Comment: she actually sees Oneta Rack, NP who manages her medications     -Vitals obtained; medications/ allergies reconciled;  personal medical, social, Sx etc.histories were updated by CMA, reviewed by me and are reflected in chart   Patient Active Problem List   Diagnosis Date Noted  . Vitamin D deficiency 05/30/2019  . Environmental and seasonal allergies 05/30/2019  . Hyperlipidemia 08/25/2018  . Low HDL (under 40) 08/25/2018  . Health education/counseling 06/02/2018  . Bipolar disorder, curr episode depressed, severe, w/psychotic features (HCC) 05/03/2017  . Bunion of great toe of left foot 10/09/2016  . IBS (irritable bowel syndrome) 10/09/2016  . Perimenopause- on Xulane 10/09/2016  . Insomnia disorder 10/09/2016  . Moderate persistent asthma-  09/28/2016  . Allergic rhinoconjunctivitis 09/28/2016  . Dysphagia 09/28/2016  . Constipation 01/03/2015  .  Internal bleeding hemorrhoids 01/03/2015  . Anal bleeding 01/03/2015     Current Meds  Medication Sig  . albuterol (PROAIR HFA) 108 (90 Base) MCG/ACT inhaler Inhale 2 puffs into the lungs every 4 (four) hours as needed for wheezing or shortness of breath.  . beclomethasone (QVAR REDIHALER) 80 MCG/ACT inhaler Inhale 2 puffs into the lungs daily.  . benztropine (COGENTIN) 0.5 MG tablet Take 1 tablet (0.5 mg total) by mouth 2 (two) times daily.  . hydrOXYzine (ATARAX/VISTARIL) 25 MG tablet hydroxyzine HCl 25 mg tablet  . mirtazapine (REMERON) 15 MG tablet Take 1 tablet (15 mg total) by mouth at bedtime.  . montelukast (SINGULAIR) 5 MG chewable tablet CHEW 2 TABLETS BY MOUTH EVERY EVENING.  . OSCIMIN 0.125 MG SUBL PLACE 1 TABLET UNDER TONGUE EVERY 6 HOURS AS NEEDED  . risperiDONE (RISPERDAL) 3 MG tablet Take 1 tablet (3 mg total) by mouth at bedtime.  . [DISCONTINUED] albuterol (PROAIR HFA)  108 (90 Base) MCG/ACT inhaler Inhale 2 puffs into the lungs every 4 (four) hours as needed for wheezing or shortness of breath.  . [DISCONTINUED] beclomethasone (QVAR REDIHALER) 80 MCG/ACT inhaler Inhale 2 puffs into the lungs daily.  . [DISCONTINUED] montelukast (SINGULAIR) 5 MG chewable tablet CHEW 2 TABLETS BY MOUTH EVERY EVENING.  Needs office visit for further refills     Allergies:  No Known Allergies   ROS:  See above HPI for pertinent positives and negatives   Objective:   Height 5\' 3"  (1.6 m), weight 134 lb (60.8 kg).  (if some vitals are omitted, this means that patient was UNABLE to obtain them even though they were asked to get them prior to OV today.  They were asked to call us at their earliest convenience with these once obtained. )  General: A & O * 3; sounds in no acute distress; in usual state of health.  Skin: Pt confirms warm and dry extremities and pink fingertips HEENT: Pt confirms lips non-cyanotic Chest: Patient confirms normal chest excursion and movement Respiratory: speaking in full sentences, no conversational dyspnea; patient confirms no use of accessory muscles Psych: insight appears good, mood- appears full

## 2019-06-01 ENCOUNTER — Other Ambulatory Visit: Payer: Self-pay

## 2019-06-05 MED FILL — LEVOCETIRIZINE 2.5 MG/5 ML: 2.5 | 30 days supply | Qty: 300 | Fill #0

## 2019-06-15 MED FILL — LEVOCETIRIZINE 2.5 MG/5 ML: 2.5 | 30 days supply | Qty: 300 | Fill #0

## 2019-06-15 MED FILL — QVAR REDIHALER 80 MCG/ACT A: 80 | 60 days supply | Qty: 11 | Fill #0

## 2019-06-15 MED FILL — MONTELUKAST SOD 5 MG TAB CH: 5 | 90 days supply | Qty: 180 | Fill #0

## 2019-06-15 MED FILL — ALBUTEROL SULFATE HFA 108 (: 108 (90 BAS | 17 days supply | Qty: 18 | Fill #0

## 2019-06-28 DIAGNOSIS — G4700 Insomnia, unspecified: Secondary | ICD-10-CM | POA: Diagnosis not present

## 2019-06-28 DIAGNOSIS — F411 Generalized anxiety disorder: Secondary | ICD-10-CM | POA: Diagnosis not present

## 2019-06-28 DIAGNOSIS — F319 Bipolar disorder, unspecified: Secondary | ICD-10-CM | POA: Diagnosis not present

## 2019-09-11 MED FILL — MIRTAZAPINE 15 MG TABLET: 15 | 90 days supply | Qty: 90 | Fill #0

## 2019-09-11 MED FILL — BENZTROPINE MES 0.5 MG TAB: 0.5 | 90 days supply | Qty: 180 | Fill #0

## 2019-09-11 MED FILL — hydrOXYzine HCL 25 MG TABS: 25 | 90 days supply | Qty: 180 | Fill #0

## 2019-09-11 MED FILL — risperiDONE 3 MG TABS: 3 | 90 days supply | Qty: 90 | Fill #0

## 2020-01-10 DIAGNOSIS — F319 Bipolar disorder, unspecified: Secondary | ICD-10-CM | POA: Diagnosis not present

## 2020-01-10 DIAGNOSIS — G4700 Insomnia, unspecified: Secondary | ICD-10-CM | POA: Diagnosis not present

## 2020-01-10 DIAGNOSIS — F411 Generalized anxiety disorder: Secondary | ICD-10-CM | POA: Diagnosis not present

## 2020-01-10 MED FILL — MIRTAZAPINE 15 MG TABLET: 15 | 90 days supply | Qty: 90 | Fill #1

## 2020-01-10 MED FILL — hydrOXYzine HCL 25 MG TABS: 25 | 90 days supply | Qty: 180 | Fill #1

## 2020-01-10 MED FILL — risperiDONE 3 MG TABS: 3 | 90 days supply | Qty: 90 | Fill #1

## 2020-01-10 MED FILL — BENZTROPINE MES 0.5 MG TAB: 0.5 | 90 days supply | Qty: 180 | Fill #1

## 2020-05-01 DIAGNOSIS — F319 Bipolar disorder, unspecified: Secondary | ICD-10-CM | POA: Diagnosis not present

## 2020-05-01 DIAGNOSIS — G4700 Insomnia, unspecified: Secondary | ICD-10-CM | POA: Diagnosis not present

## 2020-05-01 DIAGNOSIS — F411 Generalized anxiety disorder: Secondary | ICD-10-CM | POA: Diagnosis not present

## 2020-05-16 ENCOUNTER — Telehealth: Payer: Self-pay | Admitting: Registered Nurse

## 2020-05-16 ENCOUNTER — Other Ambulatory Visit: Payer: Self-pay

## 2020-05-16 DIAGNOSIS — Z13228 Encounter for screening for other metabolic disorders: Secondary | ICD-10-CM

## 2020-05-16 DIAGNOSIS — E559 Vitamin D deficiency, unspecified: Secondary | ICD-10-CM

## 2020-05-16 DIAGNOSIS — E785 Hyperlipidemia, unspecified: Secondary | ICD-10-CM

## 2020-05-16 DIAGNOSIS — Z131 Encounter for screening for diabetes mellitus: Secondary | ICD-10-CM

## 2020-05-16 DIAGNOSIS — Z1329 Encounter for screening for other suspected endocrine disorder: Secondary | ICD-10-CM

## 2020-05-16 NOTE — Telephone Encounter (Signed)
Pt is going to get her cpe on 06/19/20 and she has a nurse visit scheduled for 06/14/20. Please put lab orders in for cpe

## 2020-06-10 ENCOUNTER — Ambulatory Visit: Payer: No Typology Code available for payment source | Admitting: Registered Nurse

## 2020-06-14 ENCOUNTER — Ambulatory Visit: Payer: No Typology Code available for payment source

## 2020-06-17 ENCOUNTER — Other Ambulatory Visit: Payer: Self-pay | Admitting: Physician Assistant

## 2020-06-17 DIAGNOSIS — E785 Hyperlipidemia, unspecified: Secondary | ICD-10-CM

## 2020-06-17 DIAGNOSIS — E559 Vitamin D deficiency, unspecified: Secondary | ICD-10-CM

## 2020-06-17 DIAGNOSIS — Z0000 Encounter for general adult medical examination without abnormal findings: Secondary | ICD-10-CM

## 2020-06-17 DIAGNOSIS — E786 Lipoprotein deficiency: Secondary | ICD-10-CM

## 2020-06-19 ENCOUNTER — Encounter: Payer: No Typology Code available for payment source | Admitting: Registered Nurse

## 2020-06-21 ENCOUNTER — Other Ambulatory Visit: Payer: Self-pay

## 2020-06-21 ENCOUNTER — Other Ambulatory Visit: Payer: 59

## 2020-06-21 DIAGNOSIS — E559 Vitamin D deficiency, unspecified: Secondary | ICD-10-CM | POA: Diagnosis not present

## 2020-06-21 DIAGNOSIS — E785 Hyperlipidemia, unspecified: Secondary | ICD-10-CM | POA: Diagnosis not present

## 2020-06-21 DIAGNOSIS — Z1231 Encounter for screening mammogram for malignant neoplasm of breast: Secondary | ICD-10-CM

## 2020-06-21 DIAGNOSIS — E786 Lipoprotein deficiency: Secondary | ICD-10-CM

## 2020-06-21 DIAGNOSIS — Z0000 Encounter for general adult medical examination without abnormal findings: Secondary | ICD-10-CM | POA: Diagnosis not present

## 2020-06-22 LAB — HEMOGLOBIN A1C
Est. average glucose Bld gHb Est-mCnc: 105 mg/dL
Hgb A1c MFr Bld: 5.3 % (ref 4.8–5.6)

## 2020-06-22 LAB — COMPREHENSIVE METABOLIC PANEL
ALT: 9 IU/L (ref 0–32)
AST: 15 IU/L (ref 0–40)
Albumin/Globulin Ratio: 1.4 (ref 1.2–2.2)
Albumin: 4.2 g/dL (ref 3.8–4.9)
Alkaline Phosphatase: 80 IU/L (ref 48–121)
BUN/Creatinine Ratio: 10 (ref 9–23)
BUN: 9 mg/dL (ref 6–24)
Bilirubin Total: 0.4 mg/dL (ref 0.0–1.2)
CO2: 21 mmol/L (ref 20–29)
Calcium: 9.3 mg/dL (ref 8.7–10.2)
Chloride: 104 mmol/L (ref 96–106)
Creatinine, Ser: 0.86 mg/dL (ref 0.57–1.00)
GFR calc Af Amer: 90 mL/min/{1.73_m2} (ref >59)
GFR calc non Af Amer: 78 mL/min/{1.73_m2} (ref >59)
Globulin, Total: 3.1 g/dL (ref 1.5–4.5)
Glucose: 91 mg/dL (ref 65–99)
Potassium: 4.3 mmol/L (ref 3.5–5.2)
Sodium: 140 mmol/L (ref 134–144)
Total Protein: 7.3 g/dL (ref 6.0–8.5)

## 2020-06-22 LAB — LIPID PANEL
Chol/HDL Ratio: 4.2 ratio (ref 0.0–4.4)
Cholesterol, Total: 172 mg/dL (ref 100–199)
HDL: 41 mg/dL (ref >39)
LDL Chol Calc (NIH): 111 mg/dL — ABNORMAL HIGH (ref 0–99)
Triglycerides: 108 mg/dL (ref 0–149)
VLDL Cholesterol Cal: 20 mg/dL (ref 5–40)

## 2020-06-22 LAB — CBC
Hematocrit: 43.3 % (ref 34.0–46.6)
Hemoglobin: 14.4 g/dL (ref 11.1–15.9)
MCH: 28.5 pg (ref 26.6–33.0)
MCHC: 33.3 g/dL (ref 31.5–35.7)
MCV: 86 fL (ref 79–97)
Platelets: 305 10*3/uL (ref 150–450)
RBC: 5.05 x10E6/uL (ref 3.77–5.28)
RDW: 13.6 % (ref 11.7–15.4)
WBC: 6.3 10*3/uL (ref 3.4–10.8)

## 2020-06-22 LAB — TSH: TSH: 1.61 u[IU]/mL (ref 0.450–4.500)

## 2020-06-24 ENCOUNTER — Other Ambulatory Visit: Payer: Self-pay

## 2020-06-24 ENCOUNTER — Ambulatory Visit (INDEPENDENT_AMBULATORY_CARE_PROVIDER_SITE_OTHER): Payer: 59 | Admitting: Physician Assistant

## 2020-06-24 ENCOUNTER — Encounter: Payer: Self-pay | Admitting: Physician Assistant

## 2020-06-24 ENCOUNTER — Other Ambulatory Visit: Payer: Self-pay | Admitting: Physician Assistant

## 2020-06-24 VITALS — BP 114/77 | HR 105 | Temp 97.6°F | Ht 63.0 in | Wt 129.8 lb

## 2020-06-24 DIAGNOSIS — E785 Hyperlipidemia, unspecified: Secondary | ICD-10-CM | POA: Diagnosis not present

## 2020-06-24 DIAGNOSIS — J4540 Moderate persistent asthma, uncomplicated: Secondary | ICD-10-CM | POA: Diagnosis not present

## 2020-06-24 DIAGNOSIS — Z0000 Encounter for general adult medical examination without abnormal findings: Secondary | ICD-10-CM

## 2020-06-24 DIAGNOSIS — J3089 Other allergic rhinitis: Secondary | ICD-10-CM

## 2020-06-24 DIAGNOSIS — R300 Dysuria: Secondary | ICD-10-CM

## 2020-06-24 LAB — POCT URINALYSIS DIPSTICK
Bilirubin, UA: NEGATIVE
Glucose, UA: NEGATIVE
Nitrite, UA: NEGATIVE
Protein, UA: NEGATIVE
Spec Grav, UA: 1.025 (ref 1.010–1.025)
Urobilinogen, UA: 0.2 E.U./dL
pH, UA: 7 (ref 5.0–8.0)

## 2020-06-24 MED ORDER — MONTELUKAST SODIUM 5 MG PO CHEW: CHEWABLE_TABLET | ORAL | 1 refills | Status: DC

## 2020-06-24 MED ORDER — QVAR REDIHALER 80 MCG/ACT IN AERB: 2.0000 | INHALATION_SPRAY | Freq: Every day | RESPIRATORY_TRACT | 1 refills | Status: DC

## 2020-06-24 MED ORDER — ALBUTEROL SULFATE HFA 108 (90 BASE) MCG/ACT IN AERS: 2.0000 | INHALATION_SPRAY | RESPIRATORY_TRACT | 1 refills | Status: DC | PRN

## 2020-06-24 MED FILL — ALBUTEROL SULFATE HFA 108 (: 108 (90 BAS | 16 days supply | Qty: 18 | Fill #0

## 2020-06-24 MED FILL — MONTELUKAST SOD 5 MG TAB CH: 5 | 90 days supply | Qty: 180 | Fill #0

## 2020-06-24 NOTE — Patient Instructions (Signed)

## 2020-06-24 NOTE — Progress Notes (Signed)
Female Physical   Impression and Recommendations:    1. Healthcare maintenance   2. Hyperlipidemia, unspecified hyperlipidemia type   3. Moderate persistent asthma, uncomplicated-   not currently well controlled   4. Environmental and seasonal allergies-  not currently well controlled   5. Dysuria      1) Anticipatory Guidance: Discussed skin CA prevention and sunscreen when outside along with skin surveillance; eating a balanced and modest diet; physical activity at least 25 minutes per day or minimum of 150 min/ week moderate to intense activity.  2) Immunizations / Screenings / Labs:   All immunizations are up-to-date per recommendations or will be updated today if pt allows.    - Patient understands with dental and vision screens they will schedule independently.  - Obtained CBC, CMP, HgA1c, Lipid panel, and TSH when fasting.  Most labs recently obtained were essentially within normal limits are stable from prior.  Recommend to follow heart healthy diet to improve cholesterol. -Placed order for mammogram. -UTD on colonoscopy, Tdap, hep C and HIV screenings. -Followed by OB/GYN for female physical including gynecologic exam.  3) Weight:  BMI meaning discussed with patient.  Discussed goal to improve diet habits to improve overall feelings of well being and objective health data. Improve nutrient density of diet through increasing intake of fruits and vegetables and decreasing saturated fats, white flour products and refined sugars. -Patient follows a vegetarian diet.  4) Healthcare maintenance: -Continue current medication regimen. Provided requested med refills. -Continue to follow-up with psychiatry. -Follow heart healthy diet and stay as active as possible. -Stay well hydrated, at least 64 fl oz -Patient reports some dysuria and has mild suprapubic tenderness on exam, UA was negative for nitrites, trace of leukocytes, and trace of blood so will send for urine culture. Pending  urine culture will send appropriate antibiotic if indicated.  Patient has IBS and believes her symptoms are most likely related to a flareup. -Follow-up in 6 months for regular office visit: HLD, asthma  Meds ordered this encounter  Medications   albuterol (PROAIR HFA) 108 (90 Base) MCG/ACT inhaler    Sig: Inhale 2 puffs into the lungs every 4 (four) hours as needed for wheezing or shortness of breath.    Dispense:  18 g    Refill:  1   beclomethasone (QVAR REDIHALER) 80 MCG/ACT inhaler    Sig: Inhale 2 puffs into the lungs daily.    Dispense:  10.6 g    Refill:  1   montelukast (SINGULAIR) 5 MG chewable tablet    Sig: CHEW 2 TABLETS BY MOUTH EVERY EVENING.    Dispense:  180 tablet    Refill:  1    Orders Placed This Encounter  Procedures   Urine Culture   POCT urinalysis dipstick     Return in about 6 months (around 12/24/2020) for HLD.    Gross side effects, risk and benefits, and alternatives of medications discussed with patient.  Patient is aware that all medications have potential side effects and we are unable to predict every side effect or drug-drug interaction that may occur.  Expresses verbal understanding and consents to current therapy plan and treatment regimen.  F-up preventative CPE in 1 year- this is in addition to any chronic care visits.    Please see orders placed and AVS handed out to patient at the end of our visit for further patient instructions/ counseling done pertaining to today's office visit.    Subjective:     CPE HPI:  Shelia Ramos is a 49 y.o. female who presents to [facility] at [facility] today for a yearly health maintenance exam.   Health Maintenance Summary  - Reviewed and updated, unless pt declines services.  Last Cologuard or Colonoscopy:  11/01/2014, repeat in 10 years Family history of Colon CA: No  Tobacco History Reviewed:  Y, never smoker CT scan for screening lung CA: N/A Alcohol and/or drug use:   No concerns; no use Dental Home:y Eye exams:y Dermatology home:y  Female Health:  PAP Smear - last known results:  Followed by OB/GYN Dr. Henderson Cloud, due for repeat pap and will schedule appt.  STD concerns:  none Lumps or breast concerns: none Breast Cancer Family History: No   Immunization History  Administered Date(s) Administered   Influenza,inj,Quad PF,6+ Mos 10/09/2016   Influenza-Unspecified 09/27/2016   Tdap 08/25/2018     Health Maintenance  Topic Date Due   COVID-19 Vaccine (1) Never done   PAP SMEAR-Modifier  01/14/2019   MAMMOGRAM  02/14/2019   INFLUENZA VACCINE  07/28/2020   COLONOSCOPY  11/01/2024   TETANUS/TDAP  08/25/2028   Hepatitis C Screening  Completed   HIV Screening  Completed     Wt Readings from Last 3 Encounters:  06/24/20 129 lb 12.8 oz (58.9 kg)  05/30/19 134 lb (60.8 kg)  08/25/18 134 lb (60.8 kg)   BP Readings from Last 3 Encounters:  06/24/20 114/77  08/25/18 114/81  05/26/18 116/78   Pulse Readings from Last 3 Encounters:  06/24/20 (!) 105  08/25/18 84  05/26/18 (!) 108     Past Medical History:  Diagnosis Date   Allergy    SEASONAL   Anxiety    Asthma    AS A CHILD   Depression    IBS (irritable bowel syndrome)    Seasonal allergies       Past Surgical History:  Procedure Laterality Date   BUNIONECTOMY Right    COLPOSCOPY        Family History  Problem Relation Age of Onset   Anxiety disorder Mother    Depression Mother    Allergic rhinitis Father    Allergic rhinitis Paternal Aunt    Allergic rhinitis Paternal Grandmother    Cancer Paternal Grandmother        stomach   Heart attack Maternal Grandfather    Angioedema Neg Hx    Asthma Neg Hx    Atopy Neg Hx    Eczema Neg Hx    Immunodeficiency Neg Hx    Urticaria Neg Hx       Social History   Substance and Sexual Activity  Drug Use No  ,   Social History   Substance and Sexual Activity  Alcohol Use No    Alcohol/week: 0.0 standard drinks  ,   Social History   Tobacco Use  Smoking Status Never Smoker  Smokeless Tobacco Never Used  ,   Social History   Substance and Sexual Activity  Sexual Activity Yes   Birth control/protection: Patch    Current Outpatient Medications on File Prior to Visit  Medication Sig Dispense Refill   benztropine (COGENTIN) 0.5 MG tablet Take 1 tablet (0.5 mg total) by mouth 2 (two) times daily. 60 tablet 0   Cetirizine HCl 10 MG CAPS Take 10 mg by mouth daily as needed.     Cholecalciferol (EQL VITAMIN D3) 5000 units capsule Take 1 capsule (5,000 Units total) by mouth daily.     hydrOXYzine (ATARAX/VISTARIL) 25  MG tablet hydroxyzine HCl 25 mg tablet     mirtazapine (REMERON) 15 MG tablet Take 1 tablet (15 mg total) by mouth at bedtime. 30 tablet 0   risperiDONE (RISPERDAL) 3 MG tablet Take 1 tablet (3 mg total) by mouth at bedtime. 30 tablet 0   No current facility-administered medications on file prior to visit.    Allergies: Patient has no known allergies.  Review of Systems: General:   Denies fever, chills, unexplained weight loss.  Optho/Auditory:   Denies visual changes, blurred vision/LOV Respiratory:   Denies SOB, DOE more than baseline levels.   Cardiovascular:   Denies chest pain, palpitations, new onset peripheral edema  Gastrointestinal:   Denies nausea, vomiting, diarrhea.  Genitourinary: Denies dysuria, freq/ urgency, flank pain or discharge from genitals.  Endocrine:     Denies hot or cold intolerance, polyuria, polydipsia. Musculoskeletal:   Denies unexplained myalgias, joint swelling, unexplained arthralgias, gait problems.  Skin:  Denies rash, suspicious lesions Neurological:     Denies dizziness, unexplained weakness, numbness  Psychiatric/Behavioral:   Denies mood changes, suicidal or homicidal ideations, hallucinations    Objective:    Blood pressure 114/77, pulse (!) 105, temperature 97.6 F (36.4 C), temperature  source Oral, height 5\' 3"  (1.6 m), weight 129 lb 12.8 oz (58.9 kg), SpO2 95 %. Body mass index is 22.99 kg/m. General Appearance:    Alert, cooperative, no distress, appears stated age  Head:    Normocephalic, without obvious abnormality, atraumatic  Eyes:    PERRL, conjunctiva/corneas clear, EOM's intact, fundi    benign, both eyes  Ears:    Normal TM's and external ear canals, both ears  Nose:   Nares normal, septum midline, mucosa normal, no drainage    or sinus tenderness  Throat:   Lips w/o lesion, mucosa moist, and tongue normal; teeth and   gums normal  Neck:   Supple, symmetrical, trachea midline, no adenopathy;    thyroid:  no enlargement/tenderness/nodules; no carotid   bruit or JVD  Back:     Symmetric, no curvature, ROM normal, no CVA tenderness  Lungs:     Clear to auscultation bilaterally, respirations unlabored, no       Wh/ R/ R  Chest Wall:    No tenderness or gross deformity; normal excursion   Heart:    Regular rate and rhythm, S1 and S2 normal, no murmur, rub   or gallop  Breast Exam:   Deferred to OB/GYN  Abdomen:     Soft, mild suprapubic tenderness, NBS, No G/R/R, no masses, no organomegaly  Genitalia:   Deferred to OB/GYN  Rectal:    Deferred to OB/GYN  Extremities:   Extremities normal, atraumatic, no cyanosis or gross edema  Pulses:   Normal  Skin:   Warm, dry, Skin color, texture, turgor normal, no obvious rashes or lesions Psych: No HI/SI, judgement and insight good, Euthymic mood. Full Affect.  Neurologic:   CNII-XII grossly intact, normal strength, sensation and reflexes

## 2020-06-26 ENCOUNTER — Other Ambulatory Visit (HOSPITAL_COMMUNITY): Payer: Self-pay | Admitting: Adult Health Nurse Practitioner

## 2020-06-26 LAB — URINE CULTURE

## 2020-06-26 MED FILL — BENZTROPINE MES 0.5 MG TAB: 0.5 | 90 days supply | Qty: 180 | Fill #0

## 2020-06-26 MED FILL — RISPERIDONE 3 MG TABS: 3 | 90 days supply | Qty: 90 | Fill #0

## 2020-06-26 MED FILL — hydrOXYzine HCL 25 MG TABS: 25 | 90 days supply | Qty: 180 | Fill #0

## 2020-06-27 ENCOUNTER — Encounter: Payer: Self-pay | Admitting: Physician Assistant

## 2020-07-02 ENCOUNTER — Other Ambulatory Visit (HOSPITAL_COMMUNITY): Payer: Self-pay | Admitting: Adult Health Nurse Practitioner

## 2020-07-02 MED FILL — MIRTAZAPINE 15 MG TABS: 15 | 90 days supply | Qty: 90 | Fill #0

## 2020-07-16 ENCOUNTER — Telehealth: Payer: Self-pay

## 2020-07-16 NOTE — Telephone Encounter (Signed)
Insurance denied coverage for Qvar.  Insurance says that the patient has to have tried and failed either Flovent, Arnuity, or Ellipta within the last 360 days before they will cover Qvar.  Please consider alternative therapy and send new RX to [facility] if appropriate.  Tiajuana Amass, CMA

## 2020-07-17 ENCOUNTER — Other Ambulatory Visit: Payer: Self-pay | Admitting: Physician Assistant

## 2020-07-17 ENCOUNTER — Telehealth: Payer: Self-pay | Admitting: Physician Assistant

## 2020-07-17 DIAGNOSIS — J4540 Moderate persistent asthma, uncomplicated: Secondary | ICD-10-CM

## 2020-07-17 MED ORDER — TRELEGY ELLIPTA 100-62.5-25 MCG/INH IN AEPB: 1.0000 | INHALATION_SPRAY | Freq: Every day | RESPIRATORY_TRACT | 3 refills | Status: DC

## 2020-07-17 MED FILL — TRELEGY ELLIPTA 100-62.5-25: 100-62.5-25 | 30 days supply | Qty: 60 | Fill #0

## 2020-07-17 NOTE — Telephone Encounter (Signed)
Qvar not covered by insurance and requesting alternative on formulary. Sent rx for Trelegy Ellipta.

## 2020-07-17 NOTE — Telephone Encounter (Signed)
Sent rx for Trelegy Ellipta.   Thank you, Kandis Cocking

## 2020-07-18 NOTE — Telephone Encounter (Signed)
Left message for patient to advise below. AS, CMA 

## 2020-07-31 ENCOUNTER — Ambulatory Visit: Payer: 59 | Admitting: Physician Assistant

## 2020-08-28 DIAGNOSIS — G4700 Insomnia, unspecified: Secondary | ICD-10-CM | POA: Diagnosis not present

## 2020-08-28 DIAGNOSIS — F411 Generalized anxiety disorder: Secondary | ICD-10-CM | POA: Diagnosis not present

## 2020-08-28 DIAGNOSIS — F319 Bipolar disorder, unspecified: Secondary | ICD-10-CM | POA: Diagnosis not present

## 2020-09-03 ENCOUNTER — Ambulatory Visit: Payer: 59

## 2020-09-26 ENCOUNTER — Other Ambulatory Visit (HOSPITAL_COMMUNITY): Payer: Self-pay | Admitting: Physician Assistant

## 2020-09-26 ENCOUNTER — Encounter: Payer: Self-pay | Admitting: Physician Assistant

## 2020-09-26 ENCOUNTER — Ambulatory Visit (INDEPENDENT_AMBULATORY_CARE_PROVIDER_SITE_OTHER): Payer: 59 | Admitting: Physician Assistant

## 2020-09-26 VITALS — BP 104/70 | HR 119 | Ht 63.0 in | Wt 124.4 lb

## 2020-09-26 DIAGNOSIS — K581 Irritable bowel syndrome with constipation: Secondary | ICD-10-CM | POA: Diagnosis not present

## 2020-09-26 MED ORDER — OMEPRAZOLE 20 MG PO CPDR
20.0000 mg | DELAYED_RELEASE_CAPSULE | Freq: Every day | ORAL | 11 refills | Status: DC
Start: 2020-09-26 — End: 2020-09-26

## 2020-09-26 MED ORDER — HYOSCYAMINE SULFATE 0.125 MG SL SUBL
0.1250 mg | SUBLINGUAL_TABLET | Freq: Every day | SUBLINGUAL | 11 refills | Status: DC
Start: 2020-09-26 — End: 2021-09-09

## 2020-09-26 MED FILL — OSCIMIN 0.125 MG SUBL: 0.125 | 30 days supply | Qty: 30 | Fill #0

## 2020-09-26 MED FILL — OMEPRAZOLE 20 MG CAP: 20 | 30 days supply | Qty: 30 | Fill #0

## 2020-09-26 NOTE — Progress Notes (Signed)
Subjective:    Patient ID: Shelia Ramos, female    DOB: 02-07-1971, 49 y.o.   MRN: [id number]  HPI Shelia Ramos is a pleasant 49 year old white female, established with Dr. Lavon Ramos, last seen in 2017 who comes in today with recent increase in IBS symptoms.  She also has intermittent symptomatic GERD. Patient underwent colonoscopy in 2015 for screening which was negative with exception of internal hemorrhoids. She has long history of IBS which had been alternating in the past and improved previously with dietary modification probiotics and fiber supplementation. She says that she has been having a flare of her IBS symptoms over the past few months.  She is now having more issues with constipation and generally going 2 to 3 days without bowel movements and when she does have a bowel movement has been having pellet-like stools.  She is also noticed an increase in mucus and increase in what she describes as pelvic pain which is typical for her with IBS.  She says she has had the pelvic discomfort off and on for years and has found Levsin sublingual to be helpful.  She has not been on any recent fiber supplement.  She says she really does not drink much water and has not been watching her diet.  She will use Ex-Lax as needed but has not been requiring it regularly.  She has not noted any melena or hematochezia.  She definitely feels that her pelvic discomfort improves with good bowel movements. She has also noted some occasional nighttime reflux symptoms occasionally waking up with cough.  Generally not bothered during the daytime with heartburn or indigestion.  No complaints of dysphagia or odynophagia. Family history negative for colon cancer or polyps and IBD.  Review of Systems Pertinent positive and negative review of systems were noted in the above HPI section.  All other review of systems was otherwise negative.  Outpatient Encounter Medications as of 09/26/2020  Medication Sig  . albuterol  (PROAIR HFA) 108 (90 Base) MCG/ACT inhaler Inhale 2 puffs into the lungs every 4 (four) hours as needed for wheezing or shortness of breath.  . benztropine (COGENTIN) 0.5 MG tablet Take 1 tablet (0.5 mg total) by mouth 2 (two) times daily.  . hydrOXYzine (ATARAX/VISTARIL) 25 MG tablet Take 25 mg by mouth 2 (two) times daily as needed.   . mirtazapine (REMERON) 15 MG tablet Take 1 tablet (15 mg total) by mouth at bedtime.  . montelukast (SINGULAIR) 5 MG chewable tablet CHEW 2 TABLETS BY MOUTH EVERY EVENING.  Marland Kitchen risperiDONE (RISPERDAL) 3 MG tablet Take 1 tablet (3 mg total) by mouth at bedtime.  . beclomethasone (QVAR REDIHALER) 80 MCG/ACT inhaler Inhale 2 puffs into the lungs daily. (Patient not taking: Reported on 09/26/2020)  . Cetirizine HCl 10 MG CAPS Take 10 mg by mouth daily as needed. (Patient not taking: Reported on 09/26/2020)  . Cholecalciferol (EQL VITAMIN D3) 5000 units capsule Take 1 capsule (5,000 Units total) by mouth daily. (Patient not taking: Reported on 09/26/2020)  . Fluticasone-Umeclidin-Vilant (TRELEGY ELLIPTA) 100-62.5-25 MCG/INH AEPB Inhale 1 puff into the lungs daily. (Patient not taking: Reported on 09/26/2020)  . hyoscyamine (LEVSIN SL) 0.125 MG SL tablet Place 1 tablet (0.125 mg total) under the tongue at bedtime.  Marland Kitchen omeprazole (PRILOSEC) 20 MG capsule Take 1 capsule (20 mg total) by mouth daily.   No facility-administered encounter medications on file as of 09/26/2020.   No Known Allergies Patient Active Problem List   Diagnosis Date Noted  . Vitamin  D deficiency 05/30/2019  . Environmental and seasonal allergies 05/30/2019  . Hyperlipidemia 08/25/2018  . Low HDL (under 40) 08/25/2018  . Health education/counseling 06/02/2018  . Bipolar disorder, curr episode depressed, severe, w/psychotic features (HCC) 05/03/2017  . Bunion of great toe of left foot 10/09/2016  . IBS (irritable bowel syndrome) 10/09/2016  . Perimenopause- on Xulane 10/09/2016  . Insomnia disorder  10/09/2016  . Moderate persistent asthma-  09/28/2016  . Allergic rhinoconjunctivitis 09/28/2016  . Dysphagia 09/28/2016  . Constipation 01/03/2015  . Internal bleeding hemorrhoids 01/03/2015  . Anal bleeding 01/03/2015   Social History   Socioeconomic History  . Marital status: Married    Spouse name: Not on file  . Number of children: Not on file  . Years of education: Not on file  . Highest education level: Not on file  Occupational History  . Not on file  Tobacco Use  . Smoking status: Never Smoker  . Smokeless tobacco: Never Used  Vaping Use  . Vaping Use: Never used  Substance and Sexual Activity  . Alcohol use: No    Alcohol/week: 0.0 standard drinks  . Drug use: No  . Sexual activity: Not Currently    Birth control/protection: I.U.D.  Other Topics Concern  . Not on file  Social History Narrative  . Not on file   Social Determinants of Health   Financial Resource Strain:   . Difficulty of Paying Living Expenses: Not on file  Food Insecurity:   . Worried About Programme researcher, broadcasting/film/video in the Last Year: Not on file  . Ran Out of Food in the Last Year: Not on file  Transportation Needs:   . Lack of Transportation (Medical): Not on file  . Lack of Transportation (Non-Medical): Not on file  Physical Activity:   . Days of Exercise per Week: Not on file  . Minutes of Exercise per Session: Not on file  Stress:   . Feeling of Stress : Not on file  Social Connections:   . Frequency of Communication with Friends and Family: Not on file  . Frequency of Social Gatherings with Friends and Family: Not on file  . Attends Religious Services: Not on file  . Active Member of Clubs or Organizations: Not on file  . Attends Banker Meetings: Not on file  . Marital Status: Not on file  Intimate Partner Violence:   . Fear of Current or Ex-Partner: Not on file  . Emotionally Abused: Not on file  . Physically Abused: Not on file  . Sexually Abused: Not on file     [name]'s family history includes Allergic rhinitis in her father, paternal aunt, and paternal grandmother; Anxiety disorder in her mother; Cancer in her paternal grandmother; Depression in her mother; Heart attack in her maternal grandfather.      Objective:    Vitals:   09/26/20 0830  BP: 104/70  Pulse: (!) 119  SpO2: 98%    Physical Exam Well-developed well-nourishedWF in no acute distress.  Height, Weight,124 BMI 22  HEENT; nontraumatic normocephalic, EOMI, PER R LA, sclera anicteric. Oropharynx;not done Neck; supple, no JVD Cardiovascular; regular rate and rhythm with S1-S2, no murmur rub or gallop Pulmonary; Clear bilaterally Abdomen; soft, no focal tenderness nondistended, no palpable mass or hepatosplenomegaly, bowel sounds are active Rectal; not done today Skin; benign exam, no jaundice rash or appreciable lesions Extremities; no clubbing cyanosis or edema skin warm and dry Neuro/Psych; alert and oriented x4, grossly nonfocal mood and affect appropriate  Assessment & Plan:   #61 49 year old white female with history of IBS with alternating bowel habits in the past now presenting with flare of IBS symptoms, constipation predominant and associated with lower abdominal pain. Negative colonoscopy 2015  Patient describes pelvic pain which is typical for her with IBS exacerbations and has been present off and on for years.  #2 nocturnal GERD #3 bipolar disorder 5.  History of anxiety and depression  Plan; start Benefiber one scoop daily in a glass of water p.o. every morning Start MiraLAX 17 g in 8 ounces of water p.o. every afternoon. Patient advised to increase water intake to at least 60 ounces per day. Start omeprazole 20 mg p.o. AC dinner We discussed an antireflux regimen including n.p.o. for 2 to 3 hours prior to bedtime and elevation of the back 45 degrees to sleep. Refill hyoscyamine sublingual p.o. nightly and every 6 hours as needed for lower  abdominal pain/discomfort. Patient is asked to call back in a few weeks if symptoms have not significantly improved.  At that time may consider trial of Linzess or Amitiza. Plan follow-up colonoscopy 2025.  Jilliann Subramanian S Boston Cookson PA-C 09/26/2020   Cc: Mayer Masker, PA-C

## 2020-09-26 NOTE — Patient Instructions (Addendum)
If you are age 51 or older, your body mass index should be between 23-30. Your Body mass index is 22.04 kg/m. If this is out of the aforementioned range listed, please consider follow up with your Primary Care Provider.  If you are age 60 or younger, your body mass index should be between 19-25. Your Body mass index is 22.04 kg/m. If this is out of the aformentioned range listed, please consider follow up with your Primary Care Provider.   START Omeprazole 20 mg 1 capsule before dinner. START Hyoscyamine 0.125 mg 1 tablet at bedtime. START Miralax 17 grams in 8 ounces of water or juice at bedtime. START Benifiber once every morning  Follow  A high fiber diet and follow anti-reflux regimen.  Follow up as needed.

## 2020-09-27 NOTE — Progress Notes (Signed)
Reviewed and agree with documentation and assessment and plan. K. Veena Shevaun Lovan , MD   

## 2020-10-01 ENCOUNTER — Other Ambulatory Visit: Payer: Self-pay

## 2020-10-01 ENCOUNTER — Ambulatory Visit
Admission: RE | Admit: 2020-10-01 | Discharge: 2020-10-01 | Disposition: A | Discharge status: Home / Self Care | Payer: 59 | Source: Ambulatory Visit | Attending: Physician Assistant | Admitting: Physician Assistant

## 2020-10-01 DIAGNOSIS — Z1231 Encounter for screening mammogram for malignant neoplasm of breast: Secondary | ICD-10-CM

## 2020-10-10 MED FILL — OSCIMIN 0.125 MG SUBL: 0.125 | 30 days supply | Qty: 30 | Fill #0

## 2020-10-10 MED FILL — OMEPRAZOLE 20 MG CAP: 20 | 30 days supply | Qty: 30 | Fill #0

## 2020-10-10 MED FILL — MONTELUKAST SOD 5 MG TAB CH: 5 | 90 days supply | Qty: 180 | Fill #1

## 2020-10-10 MED FILL — TRELEGY ELLIPTA 100-62.5-25: 100-62.5-25 | 30 days supply | Qty: 60 | Fill #0

## 2020-10-11 ENCOUNTER — Other Ambulatory Visit: Payer: Self-pay | Admitting: Physician Assistant

## 2020-10-11 DIAGNOSIS — J4540 Moderate persistent asthma, uncomplicated: Secondary | ICD-10-CM

## 2020-10-11 MED ORDER — QVAR REDIHALER 80 MCG/ACT IN AERB: 2.0000 | INHALATION_SPRAY | Freq: Every day | RESPIRATORY_TRACT | 0 refills | Status: DC

## 2020-10-25 IMAGING — MG DIGITAL SCREENING BILAT W/ CAD
4 series · 4 of 4 positions shown · non-contrast
Comparison: Previous exam(s).

CLINICAL DATA: Screening.

EXAM:
DIGITAL SCREENING BILATERAL MAMMOGRAM WITH CAD

[L CC]
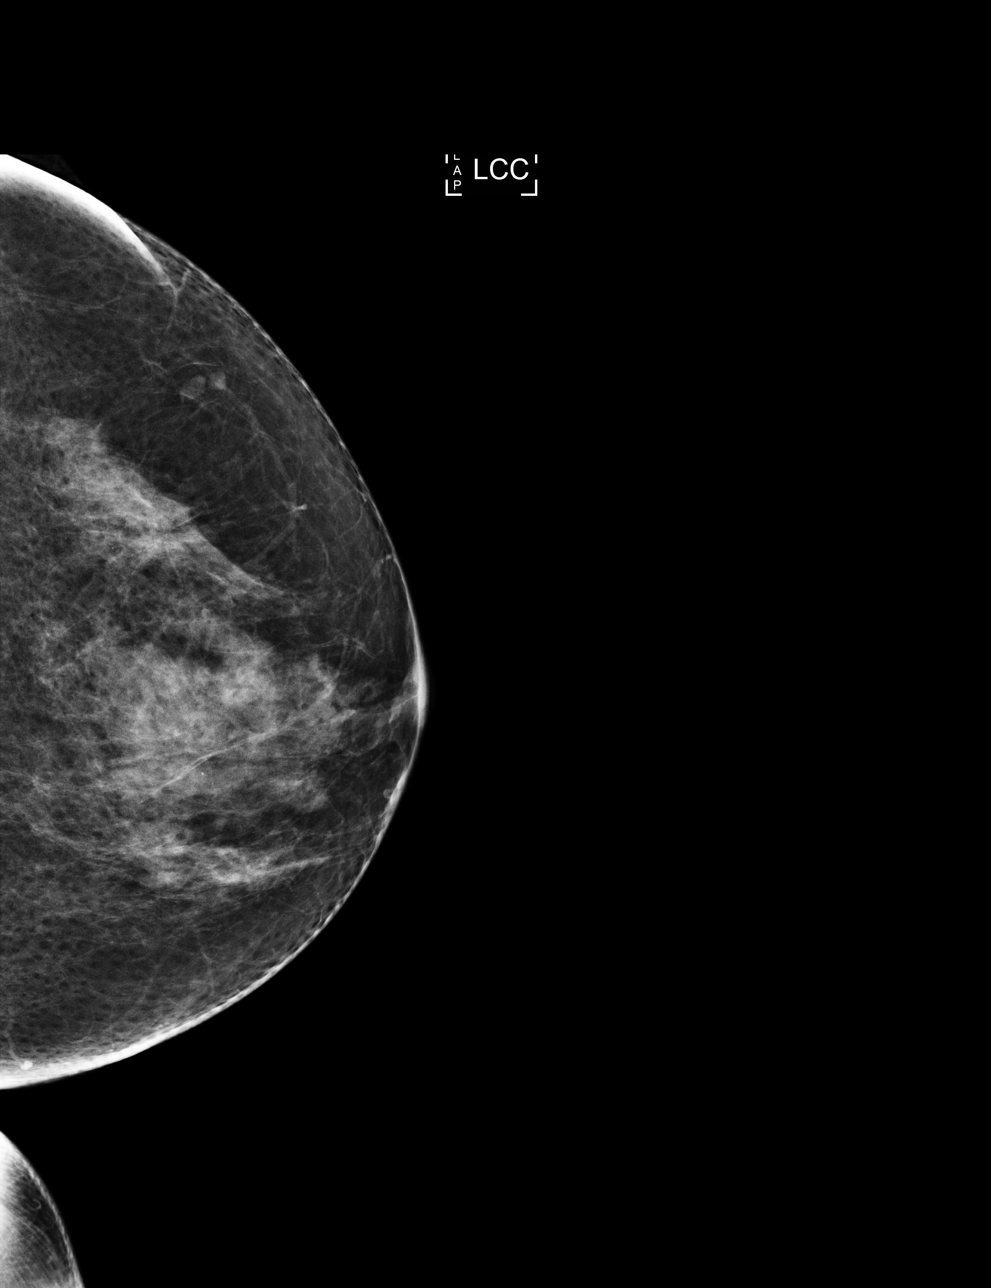

[R MLO]
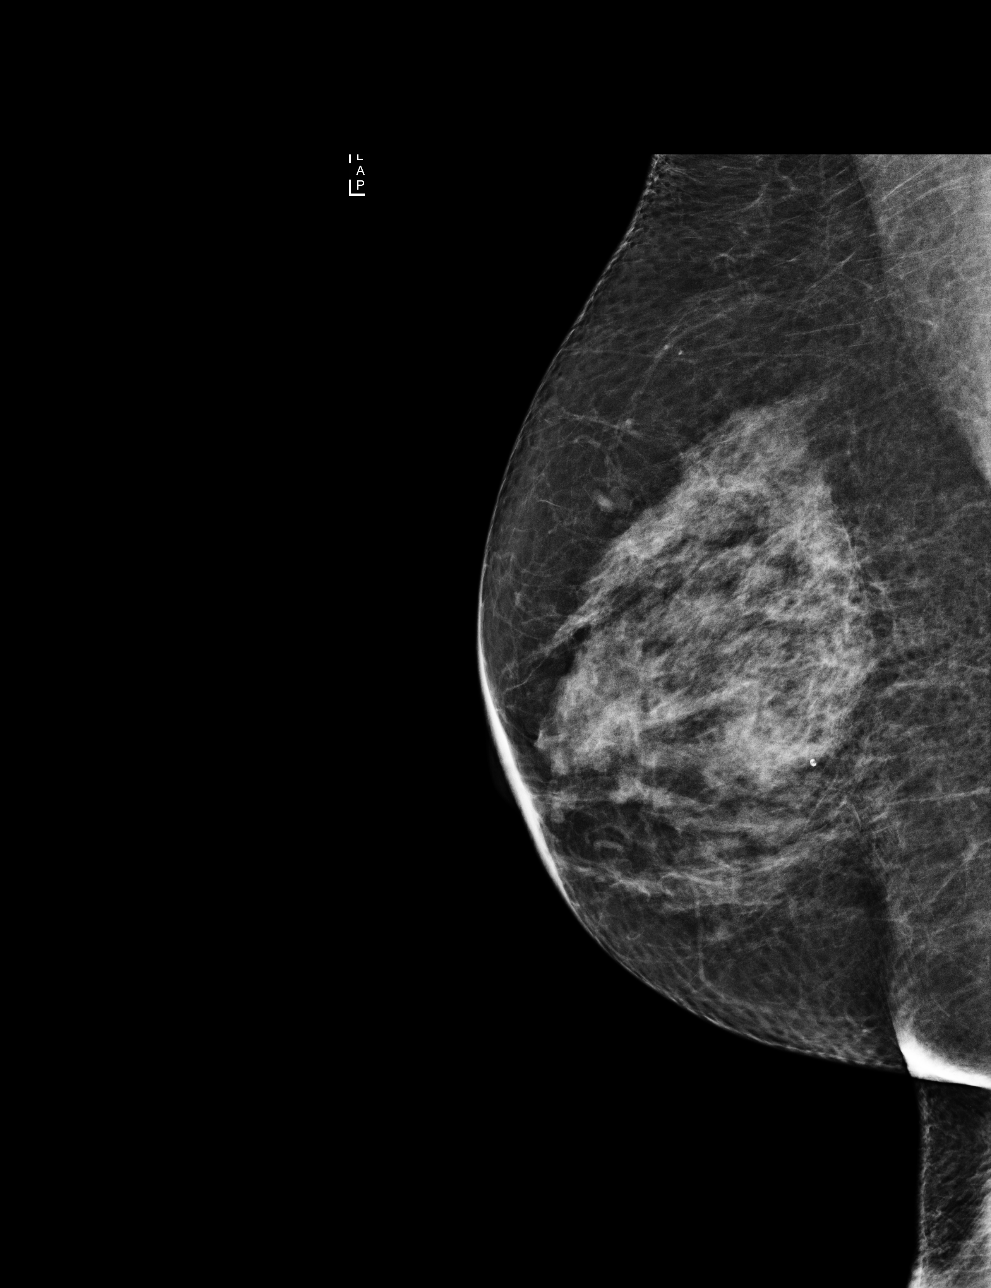

[R CC]
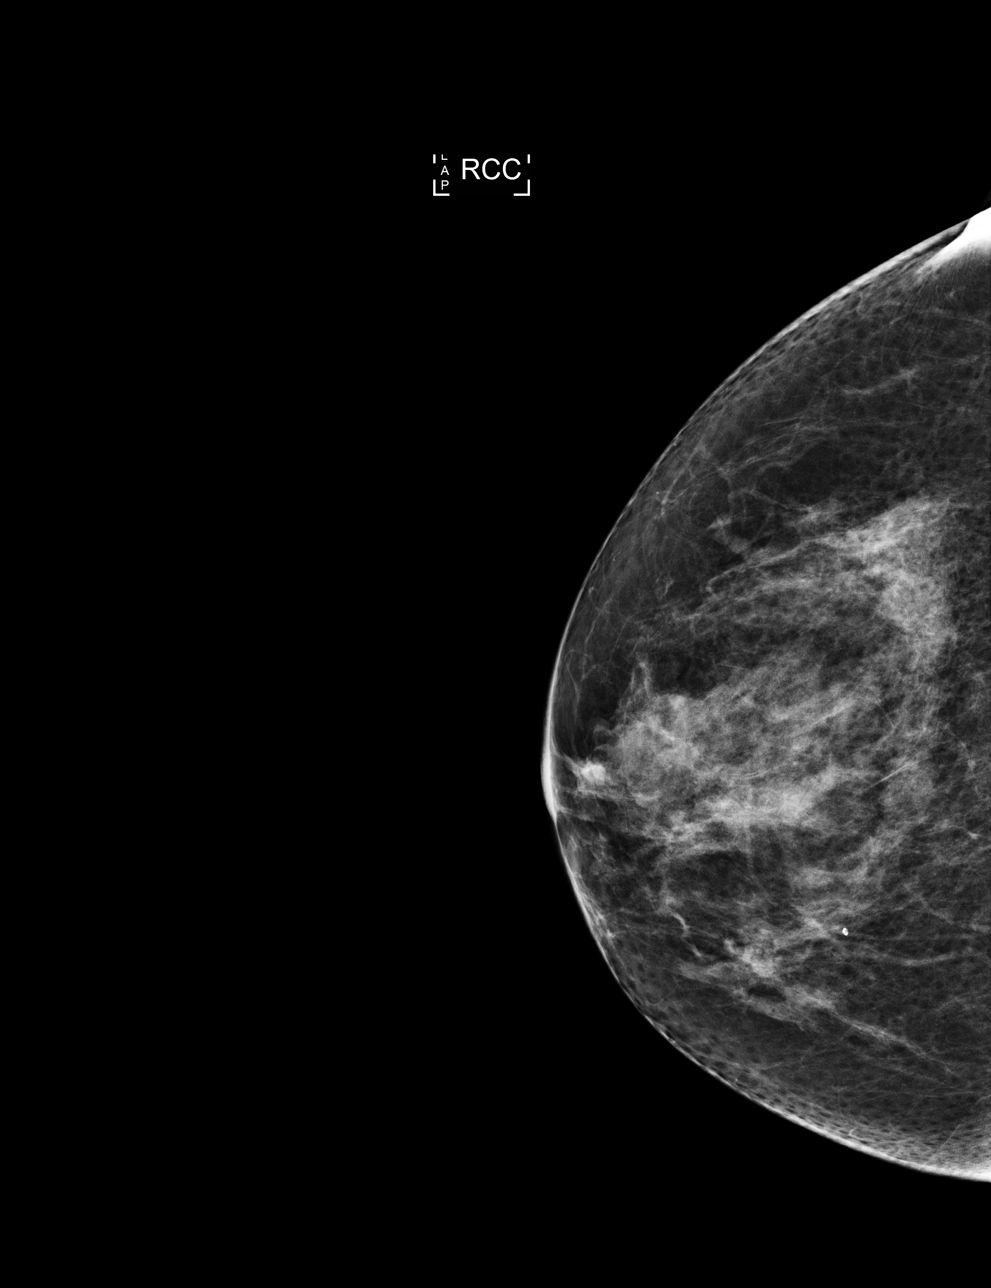

[L MLO]
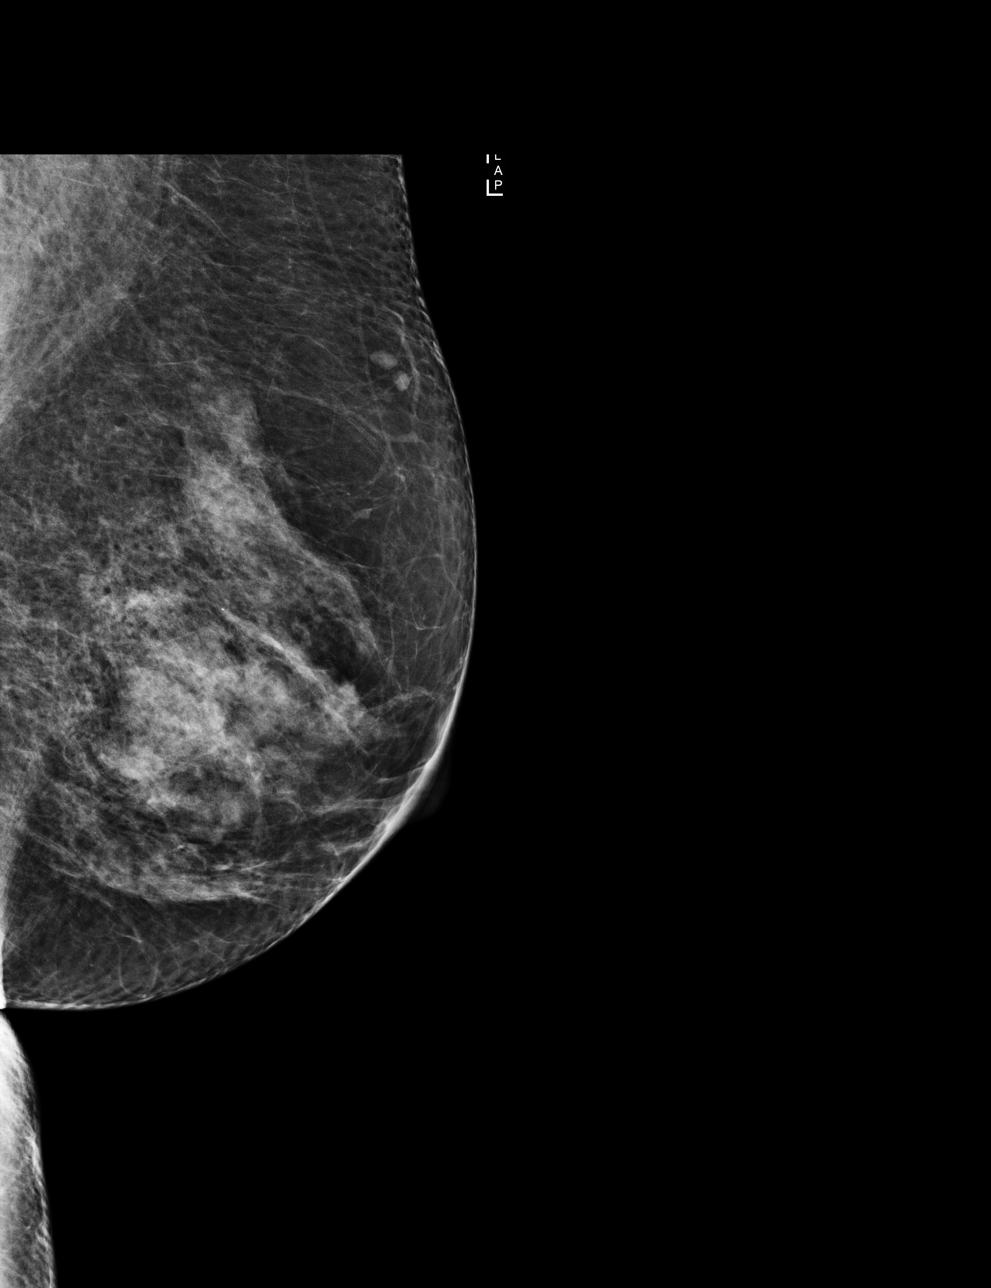

[4 of 4 positions shown; findings below may reference images not displayed]

ACR Breast Density Category c: The breast tissue is heterogeneously
dense, which may obscure small masses.
FINDINGS: There are no findings suspicious for malignancy. Images were
processed with CAD.
IMPRESSION: No mammographic evidence of malignancy. A result letter of this
screening mammogram will be mailed directly to the patient.

RECOMMENDATION:
Screening mammogram in one year. (Code:YJ-[id number])

BI-RADS CATEGORY  1: Negative.

## 2020-12-04 DIAGNOSIS — G4700 Insomnia, unspecified: Secondary | ICD-10-CM | POA: Diagnosis not present

## 2020-12-04 DIAGNOSIS — F411 Generalized anxiety disorder: Secondary | ICD-10-CM | POA: Diagnosis not present

## 2020-12-04 DIAGNOSIS — F319 Bipolar disorder, unspecified: Secondary | ICD-10-CM | POA: Diagnosis not present

## 2020-12-10 MED FILL — MIRTAZAPINE 15 MG TABS: 15 | 90 days supply | Qty: 90 | Fill #1

## 2020-12-10 MED FILL — OSCIMIN 0.125 MG SUBL: 0.125 | 30 days supply | Qty: 30 | Fill #1

## 2020-12-10 MED FILL — RISPERIDONE 3 MG TABS: 3 | 90 days supply | Qty: 90 | Fill #1

## 2020-12-24 ENCOUNTER — Ambulatory Visit: Payer: 59 | Admitting: Physician Assistant

## 2021-01-07 ENCOUNTER — Other Ambulatory Visit: Payer: Self-pay | Admitting: Physician Assistant

## 2021-01-07 DIAGNOSIS — J3089 Other allergic rhinitis: Secondary | ICD-10-CM

## 2021-01-07 DIAGNOSIS — J4540 Moderate persistent asthma, uncomplicated: Secondary | ICD-10-CM

## 2021-01-07 MED FILL — MONTELUKAST SOD 5 MG TAB CH: 5 | 90 days supply | Qty: 180 | Fill #0

## 2021-01-09 ENCOUNTER — Encounter: Payer: Self-pay | Admitting: Physician Assistant

## 2021-01-09 ENCOUNTER — Ambulatory Visit (INDEPENDENT_AMBULATORY_CARE_PROVIDER_SITE_OTHER): Payer: 59 | Admitting: Physician Assistant

## 2021-01-09 VITALS — Ht 63.0 in | Wt 130.0 lb

## 2021-01-09 DIAGNOSIS — F315 Bipolar disorder, current episode depressed, severe, with psychotic features: Secondary | ICD-10-CM

## 2021-01-09 DIAGNOSIS — J4540 Moderate persistent asthma, uncomplicated: Secondary | ICD-10-CM | POA: Diagnosis not present

## 2021-01-09 DIAGNOSIS — E785 Hyperlipidemia, unspecified: Secondary | ICD-10-CM | POA: Diagnosis not present

## 2021-01-09 NOTE — Progress Notes (Signed)
Telehealth office visit note for Shelia Masker, PA-C- at Primary Care at [facility]   I connected with current patient today by telephone and verified that I am speaking with the correct person   . Location of the patient: Home . Location of the provider: Office - This visit type was conducted due to national recommendations for restrictions regarding the COVID-19 Pandemic (e.g. social distancing) in an effort to limit this patient's exposure and mitigate transmission in our community.    - No physical exam could be performed with this format, beyond that communicated to [place] by the patient/ family members as noted.   - Additionally my office staff/ schedulers were to discuss with the patient that there may be a monetary charge related to this service, depending on their medical insurance.  My understanding is that patient understood and consented to proceed.     _________________________________________________________________________________   History of Present Illness: Patient calls in to follow up on asthma and hyperlipidemia. Has no acute concerns today.  Asthma: Patient reports asthma is controlled. Has not needed to use albuterol. Singulair helps with keeping breathing and wheezing stable. Plans to go pick up recent refill of Singulair and will ask pharmacist to show her how to use Trelegy.   Hyperlipidemia: Patient follows a vegetarian diet with no meat. Does consume whole dairy products.     GAD 7 : Generalized Anxiety Score 05/30/2019  Nervous, Anxious, on Edge 0  Control/stop worrying 0  Worry too much - different things 0  Trouble relaxing 0  Restless 0  Easily annoyed or irritable 0  Afraid - awful might happen 0  Total GAD 7 Score 0  Anxiety Difficulty Not difficult at all    Depression screen [facility] 2/9 01/09/2021 06/24/2020 05/30/2019 08/25/2018 05/26/2018  Decreased Interest 0 0 0 1 2  Down, Depressed, Hopeless 0 0 0 1 1  PHQ - 2 Score 0 0 0 2 3  Altered  sleeping 1 1 0 1 1  Tired, decreased energy 1 1 0 3 3  Change in appetite 0 0 0 0 3  Feeling bad or failure about yourself  0 0 0 0 0  Trouble concentrating 0 0 0 0 0  Moving slowly or fidgety/restless 0 0 0 0 0  Suicidal thoughts 0 0 0 0 0  PHQ-9 Score 2 2 0 6 10  Difficult doing work/chores Not difficult at all Not difficult at all Not difficult at all - Somewhat difficult      Impression and Recommendations:     1. Moderate persistent asthma-    2. Hyperlipidemia, unspecified hyperlipidemia type   3. Bipolar disorder, curr episode depressed, severe, w/psychotic features (HCC)      Moderate persistent asthma: -Controlled. -Continue current medication regimen. Advised patient to come by the office if pharmacy not able to demonstrate proper use of Trelegy. Patient verbalized understanding. -Will continue to monitor.  Hyperlipidemia: -Last lipid panel: total cholesterol 172, triglycerides 108, HDL 41, LDL 111 -Recommend to reduce saturated and trans fats such as whole milk and/or cheese. -Will continue to monitor and repeat lipid panel with CPE.  Bipolar disorder: -Followed by Psychiatry, Oneta Rack ANP -Continue current medication regimen.  - As part of my medical decision making, I reviewed the following data within the electronic MEDICAL RECORD NUMBER History obtained from pt /family, CMA notes reviewed and incorporated if applicable, Labs reviewed, Radiograph/ tests reviewed if applicable and OV notes from prior OV's with me, as  well as any other specialists she/he has seen since seeing me last, were all reviewed and used in my medical decision making process today.    - Additionally, when appropriate, discussion had with patient regarding our treatment plan, and their biases/concerns about that plan were used in my medical decision making today.    - The patient agreed with the plan and demonstrated an understanding of the instructions.   No barriers to understanding were  identified.     - The patient was advised to call back or seek an in-person evaluation if the symptoms worsen or if the condition fails to improve as anticipated.   Return in about 6 months (around 07/09/2021) for CPE and FBW .    No orders of the defined types were placed in this encounter.   No orders of the defined types were placed in this encounter.   Medications Discontinued During This Encounter  Medication Reason  . beclomethasone (QVAR REDIHALER) 80 MCG/ACT inhaler Prescription never filled       Time spent on visit including pre-visit chart review and post-visit care was 10 minutes.      The 21st Century Cures Act was signed into law in 2016 which includes the topic of electronic health records.  This provides immediate access to information in MyChart.  This includes consultation notes, operative notes, office notes, lab results and pathology reports.  If you have any questions about what you read please let us know at your next visit or call us at the office.  We are right here with you.  Note:  This note was prepared with assistance of Dragon voice recognition software. Occasional wrong-word or sound-a-like substitutions may have occurred due to the inherent limitations of voice recognition software.   __________________________________________________________________________________     Patient Care Team    Relationship Specialty Notifications Start End  Shelia Ramos, [place] PCP - General   04/28/20   Napoleon Form, MD Consulting Physician Gastroenterology  10/09/16   Asencion Islam, DPM Consulting Physician Podiatry  10/09/16   Carrington Clamp, MD Consulting Physician Obstetrics and Gynecology  10/09/16   Marcelyn Bruins, MD Consulting Physician Allergy  10/09/16   Lynden Ang, MD Referring Physician Psychiatry  10/09/16    Comment: she actually sees Oneta Rack, NP who manages her medications     -Vitals obtained; medications/  allergies reconciled;  personal medical, social, Sx etc.histories were updated by CMA, reviewed by me and are reflected in chart   Patient Active Problem List   Diagnosis Date Noted  . Vitamin D deficiency 05/30/2019  . Environmental and seasonal allergies 05/30/2019  . Hyperlipidemia 08/25/2018  . Low HDL (under 40) 08/25/2018  . Health education/counseling 06/02/2018  . Bipolar disorder, curr episode depressed, severe, w/psychotic features (HCC) 05/03/2017  . Bunion of great toe of left foot 10/09/2016  . IBS (irritable bowel syndrome) 10/09/2016  . Perimenopause- on Xulane 10/09/2016  . Insomnia disorder 10/09/2016  . Moderate persistent asthma-  09/28/2016  . Allergic rhinoconjunctivitis 09/28/2016  . Dysphagia 09/28/2016  . Constipation 01/03/2015  . Internal bleeding hemorrhoids 01/03/2015  . Anal bleeding 01/03/2015     Current Meds  Medication Sig  . albuterol (PROAIR HFA) 108 (90 Base) MCG/ACT inhaler Inhale 2 puffs into the lungs every 4 (four) hours as needed for wheezing or shortness of breath.  . benztropine (COGENTIN) 0.5 MG tablet Take 1 tablet (0.5 mg total) by mouth 2 (two) times daily.  . Fluticasone-Umeclidin-Vilant (TRELEGY ELLIPTA) 100-62.5-25 MCG/INH AEPB Inhale  1 puff into the lungs daily.  . hydrOXYzine (ATARAX/VISTARIL) 25 MG tablet Take 25 mg by mouth 2 (two) times daily as needed.   . hyoscyamine (LEVSIN SL) 0.125 MG SL tablet Place 1 tablet (0.125 mg total) under the tongue at bedtime.  . mirtazapine (REMERON) 15 MG tablet Take 1 tablet (15 mg total) by mouth at bedtime.  . montelukast (SINGULAIR) 5 MG chewable tablet CHEW 2 TABLETS BY MOUTH EVERY EVENING.  Marland Kitchen omeprazole (PRILOSEC) 20 MG capsule Take 1 capsule (20 mg total) by mouth daily.  . risperiDONE (RISPERDAL) 3 MG tablet Take 1 tablet (3 mg total) by mouth at bedtime.     Allergies:  No Known Allergies   ROS:  See above HPI for pertinent positives and negatives   Objective:   Height 5'  3" (1.6 m), weight 130 lb (59 kg).  (if some vitals are omitted, this means that patient was UNABLE to obtain them. ) General: A & O * 3; sounds in no acute distress Respiratory: speaking in full sentences, no conversational dyspnea Psych: insight appears good, mood- appears full

## 2021-01-20 MED FILL — OSCIMIN 0.125 MG SUBL: 0.125 | 30 days supply | Qty: 30 | Fill #2

## 2021-01-29 MED FILL — OSCIMIN 0.125 MG SUBL: 0.125 | 30 days supply | Qty: 30 | Fill #2

## 2021-03-05 MED FILL — MIRTAZAPINE 15 MG TABS: 15 | 90 days supply | Qty: 90 | Fill #2

## 2021-03-18 ENCOUNTER — Other Ambulatory Visit (HOSPITAL_COMMUNITY): Payer: Self-pay | Admitting: Adult Health Nurse Practitioner

## 2021-03-18 MED FILL — RISPERIDONE 3 MG TABS: 3 | 90 days supply | Qty: 90 | Fill #0

## 2021-04-09 ENCOUNTER — Other Ambulatory Visit: Payer: Self-pay | Admitting: Physician Assistant

## 2021-04-09 DIAGNOSIS — J3089 Other allergic rhinitis: Secondary | ICD-10-CM

## 2021-04-09 DIAGNOSIS — J4540 Moderate persistent asthma, uncomplicated: Secondary | ICD-10-CM

## 2021-04-10 ENCOUNTER — Other Ambulatory Visit (HOSPITAL_COMMUNITY): Payer: Self-pay

## 2021-04-10 DIAGNOSIS — F319 Bipolar disorder, unspecified: Secondary | ICD-10-CM | POA: Diagnosis not present

## 2021-04-10 DIAGNOSIS — F411 Generalized anxiety disorder: Secondary | ICD-10-CM | POA: Diagnosis not present

## 2021-04-10 DIAGNOSIS — G4700 Insomnia, unspecified: Secondary | ICD-10-CM | POA: Diagnosis not present

## 2021-04-10 MED ORDER — MONTELUKAST SODIUM 5 MG PO CHEW
CHEWABLE_TABLET | ORAL | 0 refills | Status: DC
  Filled 2021-04-10: qty 180, 90d supply, fill #0

## 2021-04-10 MED ORDER — MIRTAZAPINE 30 MG PO TABS
30.0000 mg | ORAL_TABLET | Freq: Every evening | ORAL | 2 refills | Status: DC
  Filled 2021-04-10: qty 30, 30d supply, fill #0
  Filled 2021-05-27: qty 30, 30d supply, fill #1
  Filled 2021-07-09: qty 30, 30d supply, fill #2

## 2021-04-15 ENCOUNTER — Other Ambulatory Visit (HOSPITAL_COMMUNITY): Payer: Self-pay

## 2021-04-18 ENCOUNTER — Other Ambulatory Visit (HOSPITAL_COMMUNITY): Payer: Self-pay

## 2021-04-18 MED FILL — Hyoscyamine Sulfate SL Tab 0.125 MG: SUBLINGUAL | 30 days supply | Qty: 30 | Fill #0 | Status: AC

## 2021-05-27 ENCOUNTER — Other Ambulatory Visit (HOSPITAL_COMMUNITY): Payer: Self-pay

## 2021-05-27 MED FILL — Hyoscyamine Sulfate SL Tab 0.125 MG: SUBLINGUAL | 30 days supply | Qty: 30 | Fill #1 | Status: AC

## 2021-06-18 ENCOUNTER — Other Ambulatory Visit (HOSPITAL_COMMUNITY): Payer: Self-pay

## 2021-06-18 MED FILL — Risperidone Tab 3 MG: ORAL | 90 days supply | Qty: 90 | Fill #0 | Status: AC

## 2021-06-19 ENCOUNTER — Other Ambulatory Visit (HOSPITAL_COMMUNITY): Payer: Self-pay

## 2021-07-01 ENCOUNTER — Other Ambulatory Visit (HOSPITAL_COMMUNITY): Payer: Self-pay

## 2021-07-01 MED FILL — Hyoscyamine Sulfate SL Tab 0.125 MG: SUBLINGUAL | 30 days supply | Qty: 30 | Fill #2 | Status: AC

## 2021-07-09 ENCOUNTER — Other Ambulatory Visit (HOSPITAL_COMMUNITY): Payer: Self-pay

## 2021-07-09 ENCOUNTER — Other Ambulatory Visit: Payer: Self-pay | Admitting: Physician Assistant

## 2021-07-09 DIAGNOSIS — J4540 Moderate persistent asthma, uncomplicated: Secondary | ICD-10-CM

## 2021-07-09 DIAGNOSIS — J3089 Other allergic rhinitis: Secondary | ICD-10-CM

## 2021-07-09 MED ORDER — MONTELUKAST SODIUM 5 MG PO CHEW
CHEWABLE_TABLET | ORAL | 0 refills | Status: DC
  Filled 2021-07-09: qty 180, 90d supply, fill #0

## 2021-07-10 ENCOUNTER — Other Ambulatory Visit: Payer: 59

## 2021-07-10 ENCOUNTER — Encounter: Payer: 59 | Admitting: Physician Assistant

## 2021-07-11 ENCOUNTER — Other Ambulatory Visit (HOSPITAL_COMMUNITY): Payer: Self-pay

## 2021-08-06 ENCOUNTER — Other Ambulatory Visit (HOSPITAL_COMMUNITY): Payer: Self-pay

## 2021-08-06 DIAGNOSIS — F411 Generalized anxiety disorder: Secondary | ICD-10-CM | POA: Diagnosis not present

## 2021-08-06 DIAGNOSIS — F319 Bipolar disorder, unspecified: Secondary | ICD-10-CM | POA: Diagnosis not present

## 2021-08-06 DIAGNOSIS — G4700 Insomnia, unspecified: Secondary | ICD-10-CM | POA: Diagnosis not present

## 2021-08-06 MED ORDER — MIRTAZAPINE 30 MG PO TABS
ORAL_TABLET | ORAL | 2 refills | Status: DC
  Filled 2021-08-06: qty 90, 90d supply, fill #0
  Filled 2021-11-10: qty 90, 90d supply, fill #1
  Filled 2022-02-07: qty 90, 90d supply, fill #2

## 2021-08-07 ENCOUNTER — Other Ambulatory Visit (HOSPITAL_COMMUNITY): Payer: Self-pay

## 2021-08-11 ENCOUNTER — Other Ambulatory Visit (HOSPITAL_COMMUNITY): Payer: Self-pay

## 2021-08-11 MED FILL — Hyoscyamine Sulfate SL Tab 0.125 MG: SUBLINGUAL | 30 days supply | Qty: 30 | Fill #3 | Status: AC

## 2021-08-26 ENCOUNTER — Other Ambulatory Visit: Payer: 59

## 2021-08-26 ENCOUNTER — Encounter: Payer: 59 | Admitting: Physician Assistant

## 2021-09-09 ENCOUNTER — Other Ambulatory Visit (HOSPITAL_COMMUNITY): Payer: Self-pay

## 2021-09-09 ENCOUNTER — Ambulatory Visit: Payer: 59 | Admitting: Physician Assistant

## 2021-09-09 ENCOUNTER — Encounter: Payer: Self-pay | Admitting: Physician Assistant

## 2021-09-09 VITALS — BP 106/72 | HR 110 | Ht 63.0 in | Wt 141.2 lb

## 2021-09-09 DIAGNOSIS — K5900 Constipation, unspecified: Secondary | ICD-10-CM

## 2021-09-09 DIAGNOSIS — R143 Flatulence: Secondary | ICD-10-CM | POA: Diagnosis not present

## 2021-09-09 DIAGNOSIS — K219 Gastro-esophageal reflux disease without esophagitis: Secondary | ICD-10-CM

## 2021-09-09 DIAGNOSIS — K581 Irritable bowel syndrome with constipation: Secondary | ICD-10-CM

## 2021-09-09 MED ORDER — HYOSCYAMINE SULFATE SL 0.125 MG SL SUBL
SUBLINGUAL_TABLET | SUBLINGUAL | 11 refills | Status: DC
Start: 2021-09-09 — End: 2022-08-24
  Filled 2021-09-09: qty 30, 30d supply, fill #0
  Filled 2021-10-17: qty 30, 30d supply, fill #1
  Filled 2021-11-24: qty 30, 30d supply, fill #2
  Filled 2021-12-30: qty 30, 30d supply, fill #3
  Filled 2022-01-28: qty 5, 5d supply, fill #4
  Filled 2022-01-28: qty 25, 25d supply, fill #4
  Filled 2022-03-14: qty 30, 30d supply, fill #5
  Filled 2022-04-17: qty 30, 30d supply, fill #6
  Filled 2022-05-22: qty 30, 30d supply, fill #7
  Filled 2022-06-22: qty 30, 30d supply, fill #8
  Filled 2022-07-21: qty 30, 30d supply, fill #9

## 2021-09-09 MED ORDER — OMEPRAZOLE 20 MG PO CPDR
DELAYED_RELEASE_CAPSULE | Freq: Every day | ORAL | 11 refills | Status: DC
  Filled 2021-09-09: qty 30, 30d supply, fill #0

## 2021-09-09 NOTE — Progress Notes (Signed)
Subjective:    Patient ID: Shelia Ramos, female    DOB: [birth year], 50 y.o.   MRN: [id number]  HPI [name] is a pleasant 50 year old female, established with Dr. Lavon Paganini who comes in today for routine follow-up.  Patient has history of IBS, GERD, and bipolar disorder. She was last seen here in September 2021 for IBS with constipation. She had undergone colonoscopy in 2015 which was a negative exam.  At the time of her last office visit she was placed on Benefiber 1 dose daily in a glass of water and MiraLAX 17 g in 8 ounces of water daily.  She was also taking omeprazole 20 mg daily for GERD and using Levsin sublingual as needed. She says that her constipation symptoms have not been too bad and she had gotten off track with using the Benefiber and MiraLAX.  She says she frequently just forgets to take.  She may go 2 days between bowel movements.  She says she generally has a fair amount of gas and bloating.  She has been using omeprazole 20 mg as needed for heartburn and indigestion.  She does take the Levsin on a regular basis and in the evenings and feels that helps her IBS symptoms. She feels that she has more gas usually in the evenings, on further discussion she is usually drinking diet sodas throughout the day including in the evenings.   Review of Systems. Pertinent positive and negative review of systems were noted in the above HPI section.  All other review of systems was otherwise negative.   Outpatient Encounter Medications as of 09/09/2021  Medication Sig   albuterol (PROAIR HFA) 108 (90 Base) MCG/ACT inhaler Inhale 2 puffs into the lungs every 4 (four) hours as needed for wheezing or shortness of breath.   benztropine (COGENTIN) 0.5 MG tablet Take 1 tablet (0.5 mg total) by mouth 2 (two) times daily.   Cetirizine HCl 10 MG CAPS Take 10 mg by mouth daily as needed.   hydrOXYzine (ATARAX/VISTARIL) 25 MG tablet Take 25 mg by mouth 2 (two) times daily as needed.    mirtazapine  (REMERON) 30 MG tablet Take 1/2 - 1 tablet by mouth at bedtime   montelukast (SINGULAIR) 5 MG chewable tablet CHEW 2 TABLETS BY MOUTH EVERY EVENING.   risperiDONE (RISPERDAL) 3 MG tablet TAKE 1 TABLET BY MOUTH EVERY EVENING WITH DINNER.   [DISCONTINUED] Hyoscyamine Sulfate SL 0.125 MG SUBL PLACE 1 TABLET UNDER THE TONGUE AT BEDTIME.   [DISCONTINUED] omeprazole (PRILOSEC) 20 MG capsule TAKE 1 CAPSULE BY MOUTH DAILY   Hyoscyamine Sulfate SL 0.125 MG SUBL PLACE 1 TABLET UNDER THE TONGUE AT BEDTIME.   omeprazole (PRILOSEC) 20 MG capsule TAKE 1 CAPSULE BY MOUTH DAILY   [DISCONTINUED] benztropine (COGENTIN) 0.5 MG tablet TAKE 1 TABLET BY MOUTH TWICE DAILY   [DISCONTINUED] Cholecalciferol (EQL VITAMIN D3) 5000 units capsule Take 1 capsule (5,000 Units total) by mouth daily. (Patient not taking: Reported on 01/09/2021)   [DISCONTINUED] hyoscyamine (LEVSIN SL) 0.125 MG SL tablet Place 1 tablet (0.125 mg total) under the tongue at bedtime.   [DISCONTINUED] mirtazapine (REMERON) 15 MG tablet Take 1 tablet (15 mg total) by mouth at bedtime.   [DISCONTINUED] mirtazapine (REMERON) 15 MG tablet TAKE 1 TABLET BY MOUTH AT BEDTIME   [DISCONTINUED] risperiDONE (RISPERDAL) 3 MG tablet Take 1 tablet (3 mg total) by mouth at bedtime.   [DISCONTINUED] risperiDONE (RISPERDAL) 3 MG tablet TAKE 1 TABLET BY MOUTH EVERY EVENING WITH DINNER   No facility-administered  encounter medications on file as of 09/09/2021.   No Known Allergies Patient Active Problem List   Diagnosis Date Noted   Vitamin D deficiency 05/30/2019   Environmental and seasonal allergies 05/30/2019   Hyperlipidemia 08/25/2018   Low HDL (under 40) 08/25/2018   Health education/counseling 06/02/2018   Bipolar disorder, curr episode depressed, severe, w/psychotic features (HCC) 05/03/2017   Bunion of great toe of left foot 10/09/2016   IBS (irritable bowel syndrome) 10/09/2016   Perimenopause- on Xulane 10/09/2016   Insomnia disorder 10/09/2016    Moderate persistent asthma-  09/28/2016   Allergic rhinoconjunctivitis 09/28/2016   Dysphagia 09/28/2016   Constipation 01/03/2015   Internal bleeding hemorrhoids 01/03/2015   Anal bleeding 01/03/2015   Social History   Socioeconomic History   Marital status: Married    Spouse name: Not on file   Number of children: 1   Years of education: Not on file   Highest education level: Not on file  Occupational History   Not on file  Tobacco Use   Smoking status: Never   Smokeless tobacco: Never  Vaping Use   Vaping Use: Never used  Substance and Sexual Activity   Alcohol use: No    Alcohol/week: 0.0 standard drinks   Drug use: No   Sexual activity: Not Currently    Birth control/protection: I.U.D.  Other Topics Concern   Not on file  Social History Narrative   Not on file   Social Determinants of Health   Financial Resource Strain: Not on file  Food Insecurity: Not on file  Transportation Needs: Not on file  Physical Activity: Not on file  Stress: Not on file  Social Connections: Not on file  Intimate Partner Violence: Not on file    Ms. Stammen's family history includes Allergic rhinitis in her father, paternal aunt, and paternal grandmother; Anxiety disorder in her mother; Cancer in her paternal grandmother; Depression in her mother; Heart attack in her maternal grandfather.      Objective:    Vitals:   09/09/21 0839  BP: 106/72  Pulse: (!) 110  SpO2: 97%    Physical Exam Well-developed well-nourished WF  in no acute distress.  Height, MCEYEM,336 BMI25  HEENT; nontraumatic normocephalic, EOMI, PE R LA, sclera anicteric. Oropharynx;not examined today Neck; supple, no JVD Cardiovascular; regular rate and rhythm with S1-S2, no murmur rub or gallop Pulmonary; Clear bilaterally Abdomen; soft, nontender, nondistended, no palpable mass or hepatosplenomegaly, bowel sounds are active Rectal;not done  Skin; benign exam, no jaundice rash or appreciable  lesions Extremities; no clubbing cyanosis or edema skin warm and dry Neuro/Psych; alert and oriented x4, grossly nonfocal mood and affect appropriate        Assessment & Plan:   #20 50 year old white female with history of IBS/constipation predominant.  Patient has been stable over the past year, has not been using MiraLAX and Benefiber on a regular basis and is continued to have mild constipation.  She also has complaints of bloating and gas likely secondary to dietary contributors. 2.  Colon cancer screening-negative colonoscopy 2015-will be due for follow-up 2025 3.  GERD mild-continue as needed use of low-dose omeprazole  Plan; patient is encouraged to restart Benefiber 2 tablespoons daily in a glass of water or juice, and MiraLAX 17 g in 8 ounces of water in the evening. Continue omeprazole 20 mg as needed heartburn/indigestion. Will refill Levsin sublingual 1 p.o. daily to twice daily as needed.  Currently using at at bedtime. We discussed a low low gas  diet, and specifically decreasing intake of carbonated beverages and artificial sweeteners which are likely contributing to her chronic gas and bloating. Patient will follow-up with Dr. Lavon Paganini  or myself in 1 year or sooner as needed  Monicka Cyran Oswald Hillock PA-C 09/09/2021   Cc: Mayer Masker, PA-C

## 2021-09-09 NOTE — Progress Notes (Signed)
Reviewed and agree with documentation and assessment and plan. K. Veena Bryonna Sundby , MD   

## 2021-09-09 NOTE — Patient Instructions (Addendum)
If you are age 52 or younger, your body mass index should be between 19-25. Your Body mass index is 25.02 kg/m. If this is out of the aformentioned range listed, please consider follow up with your Primary Care Provider.  __________________________________________________________  The  GI providers would like to encourage you to use [facility] to communicate with providers for non-urgent requests or questions.  Due to long hold times on the telephone, sending your provider a message by [facility] may be a faster and more efficient way to get a response.  Please allow 48 business hours for a response.  Please remember that this is for non-urgent requests.   We have sent in refills of Omeprazole and Hyoscyamine to your pharmacy.  Restart Benefiber 1 dose every morning and Miralax 1 capful in 8 ounces of water or juice every evening.  Follow a low gas diet.  Follow up in year or sooner if needed.  Thank you for entrusting me with your care and choosing [facility].  Amy Esterwood, PA-C

## 2021-09-12 ENCOUNTER — Other Ambulatory Visit: Payer: Self-pay

## 2021-09-12 ENCOUNTER — Other Ambulatory Visit (HOSPITAL_COMMUNITY): Payer: Self-pay

## 2021-09-15 ENCOUNTER — Other Ambulatory Visit: Payer: Self-pay

## 2021-09-15 ENCOUNTER — Other Ambulatory Visit (HOSPITAL_COMMUNITY): Payer: Self-pay

## 2021-09-16 ENCOUNTER — Other Ambulatory Visit (HOSPITAL_COMMUNITY): Payer: Self-pay

## 2021-09-17 ENCOUNTER — Other Ambulatory Visit (HOSPITAL_COMMUNITY): Payer: Self-pay

## 2021-09-17 ENCOUNTER — Other Ambulatory Visit: Payer: Self-pay

## 2021-09-17 MED ORDER — RISPERIDONE 3 MG PO TABS
3.0000 mg | ORAL_TABLET | Freq: Every evening | ORAL | 2 refills | Status: DC
  Filled 2021-09-17: qty 90, 90d supply, fill #0
  Filled 2021-12-19: qty 10, 10d supply, fill #1
  Filled 2021-12-19: qty 80, 80d supply, fill #1
  Filled 2022-03-25: qty 90, 90d supply, fill #2

## 2021-09-18 ENCOUNTER — Other Ambulatory Visit: Payer: Self-pay

## 2021-09-18 ENCOUNTER — Ambulatory Visit (INDEPENDENT_AMBULATORY_CARE_PROVIDER_SITE_OTHER): Payer: 59 | Admitting: Physician Assistant

## 2021-09-18 ENCOUNTER — Encounter: Payer: Self-pay | Admitting: Physician Assistant

## 2021-09-18 ENCOUNTER — Other Ambulatory Visit (HOSPITAL_COMMUNITY): Payer: Self-pay

## 2021-09-18 VITALS — BP 90/61 | HR 85 | Temp 98.6°F | Ht 63.0 in | Wt 140.8 lb

## 2021-09-18 DIAGNOSIS — E785 Hyperlipidemia, unspecified: Secondary | ICD-10-CM

## 2021-09-18 DIAGNOSIS — Z13228 Encounter for screening for other metabolic disorders: Secondary | ICD-10-CM | POA: Diagnosis not present

## 2021-09-18 DIAGNOSIS — Z1321 Encounter for screening for nutritional disorder: Secondary | ICD-10-CM

## 2021-09-18 DIAGNOSIS — Z23 Encounter for immunization: Secondary | ICD-10-CM

## 2021-09-18 DIAGNOSIS — Z1329 Encounter for screening for other suspected endocrine disorder: Secondary | ICD-10-CM | POA: Diagnosis not present

## 2021-09-18 DIAGNOSIS — E786 Lipoprotein deficiency: Secondary | ICD-10-CM

## 2021-09-18 DIAGNOSIS — Z130 Encounter for screening for diseases of the blood and blood-forming organs and certain disorders involving the immune mechanism: Secondary | ICD-10-CM

## 2021-09-18 DIAGNOSIS — Z0000 Encounter for general adult medical examination without abnormal findings: Secondary | ICD-10-CM

## 2021-09-18 DIAGNOSIS — E559 Vitamin D deficiency, unspecified: Secondary | ICD-10-CM

## 2021-09-18 MED ORDER — BENZTROPINE MESYLATE 0.5 MG PO TABS
ORAL_TABLET | ORAL | 1 refills | Status: DC
  Filled 2021-09-18: qty 180, 90d supply, fill #0
  Filled 2022-03-14: qty 180, 90d supply, fill #1

## 2021-09-18 NOTE — Patient Instructions (Signed)
Preventive Care 40-52 Years Old, Female Preventive care refers to lifestyle choices and visits with your health care provider that can promote health and wellness. This includes: A yearly physical exam. This is also called an annual wellness visit. Regular dental and eye exams. Immunizations. Screening for certain conditions. Healthy lifestyle choices, such as: Eating a healthy diet. Getting regular exercise. Not using drugs or products that contain nicotine and tobacco. Limiting alcohol use. What can I expect for my preventive care visit? Physical exam Your health care provider will check your: Height and weight. These may be used to calculate your BMI (body mass index). BMI is a measurement that tells if you are at a healthy weight. Heart rate and blood pressure. Body temperature. Skin for abnormal spots. Counseling Your health care provider may ask you questions about your: Past medical problems. Family's medical history. Alcohol, tobacco, and drug use. Emotional well-being. Home life and relationship well-being. Sexual activity. Diet, exercise, and sleep habits. Work and work environment. Access to firearms. Method of birth control. Menstrual cycle. Pregnancy history. What immunizations do I need? Vaccines are usually given at various ages, according to a schedule. Your health care provider will recommend vaccines for you based on your age, medical history, and lifestyle or other factors, such as travel or where you work. What tests do I need? Blood tests Lipid and cholesterol levels. These may be checked every 5 years, or more often if you are over 50 years old. Hepatitis C test. Hepatitis B test. Screening Lung cancer screening. You may have this screening every year starting at age 55 if you have a 30-pack-year history of smoking and currently smoke or have quit within the past 15 years. Colorectal cancer screening. All adults should have this screening starting at  age 50 and continuing until age 75. Your health care provider may recommend screening at age 45 if you are at increased risk. You will have tests every 1-10 years, depending on your results and the type of screening test. Diabetes screening. This is done by checking your blood sugar (glucose) after you have not eaten for a while (fasting). You may have this done every 1-3 years. Mammogram. This may be done every 1-2 years. Talk with your health care provider about when you should start having regular mammograms. This may depend on whether you have a family history of breast cancer. BRCA-related cancer screening. This may be done if you have a family history of breast, ovarian, tubal, or peritoneal cancers. Pelvic exam and Pap test. This may be done every 3 years starting at age 21. Starting at age 30, this may be done every 5 years if you have a Pap test in combination with an HPV test. Other tests STD (sexually transmitted disease) testing, if you are at risk. Bone density scan. This is done to screen for osteoporosis. You may have this scan if you are at high risk for osteoporosis. Talk with your health care provider about your test results, treatment options, and if necessary, the need for more tests. Follow these instructions at home: Eating and drinking  Eat a diet that includes fresh fruits and vegetables, whole grains, lean protein, and low-fat dairy products. Take vitamin and mineral supplements as recommended by your health care provider. Do not drink alcohol if: Your health care provider tells you not to drink. You are pregnant, may be pregnant, or are planning to become pregnant. If you drink alcohol: Limit how much you have to 0-1 drink a day. Be   aware of how much alcohol is in your drink. In the [place] one drink equals one 12 oz bottle of beer (355 mL), one 5 oz glass of wine (148 mL), or one 1 oz glass of hard liquor (44 mL). Lifestyle Take daily care of your teeth and  gums. Brush your teeth every morning and night with fluoride toothpaste. Floss one time each day. Stay active. Exercise for at least 30 minutes 5 or more days each week. Do not use any products that contain nicotine or tobacco, such as cigarettes, e-cigarettes, and chewing tobacco. If you need help quitting, ask your health care provider. Do not use drugs. If you are sexually active, practice safe sex. Use a condom or other form of protection to prevent STIs (sexually transmitted infections). If you do not wish to become pregnant, use a form of birth control. If you plan to become pregnant, see your health care provider for a prepregnancy visit. If told by your health care provider, take low-dose aspirin daily starting at age 63. Find healthy ways to cope with stress, such as: Meditation, yoga, or listening to music. Journaling. Talking to a trusted person. Spending time with friends and family. Safety Always wear your seat belt while driving or riding in a vehicle. Do not drive: If you have been drinking alcohol. Do not ride with someone who has been drinking. When you are tired or distracted. While texting. Wear a helmet and other protective equipment during sports activities. If you have firearms in your house, make sure you follow all gun safety procedures. What's next? Visit your health care provider once a year for an annual wellness visit. Ask your health care provider how often you should have your eyes and teeth checked. Stay up to date on all vaccines. This information is not intended to replace advice given to you by your health care provider. Make sure you discuss any questions you have with your health care provider. Document Revised: 02/21/2021 Document Reviewed: 08/25/2018 Elsevier Patient Education  2022 [employer].

## 2021-09-18 NOTE — Progress Notes (Signed)
Subjective:     Shelia Ramos is a 50 y.o. female and is here for a comprehensive physical exam. The patient reports no problems.  Social History   Socioeconomic History   Marital status: Married    Spouse name: Not on file   Number of children: 1   Years of education: Not on file   Highest education level: Not on file  Occupational History   Not on file  Tobacco Use   Smoking status: Never   Smokeless tobacco: Never  Vaping Use   Vaping Use: Never used  Substance and Sexual Activity   Alcohol use: No    Alcohol/week: 0.0 standard drinks   Drug use: No   Sexual activity: Not Currently    Birth control/protection: I.U.D.  Other Topics Concern   Not on file  Social History Narrative   Not on file   Social Determinants of Health   Financial Resource Strain: Not on file  Food Insecurity: Not on file  Transportation Needs: Not on file  Physical Activity: Not on file  Stress: Not on file  Social Connections: Not on file  Intimate Partner Violence: Not on file   Health Maintenance  Topic Date Due   PAP SMEAR-Modifier  01/14/2019   Zoster Vaccines- Shingrix (2 of 2) 11/13/2021   COVID-19 Vaccine (3 - Booster for Pfizer series) 12/22/2021   MAMMOGRAM  10/01/2022   COLONOSCOPY (Pts 45-22yrs Insurance coverage will need to be confirmed)  11/01/2024   TETANUS/TDAP  08/25/2028   INFLUENZA VACCINE  Completed   Hepatitis C Screening  Completed   HIV Screening  Completed   HPV VACCINES  Aged Out    The following portions of the patient's history were reviewed and updated as appropriate: allergies, current medications, past family history, past medical history, past social history, past surgical history, and problem list.  Review of Systems Pertinent items noted in HPI and remainder of comprehensive ROS otherwise negative.   Objective:    BP 90/61   Pulse 85   Temp 98.6 F (37 C)   Ht 5\' 3"  (1.6 m)   Wt 140 lb 12.8 oz (63.9 kg)   SpO2 98%   BMI 24.94 kg/m   General appearance: alert, cooperative, and no distress Head: Normocephalic, without obvious abnormality, atraumatic Eyes: conjunctivae/corneas clear. PERRL, EOM's intact. Fundi benign. Ears: normal TM's and external ear canals both ears Nose: Nares normal. Septum midline. Mucosa normal. No drainage or sinus tenderness. Throat: lips, mucosa, and tongue normal; teeth and gums normal Neck: no adenopathy, no carotid bruit, no JVD, supple, symmetrical, trachea midline, and thyroid: normal to inspection and palpation Back: symmetric, no curvature. ROM normal. No CVA tenderness. Lungs: clear to auscultation bilaterally Heart: regular rate and rhythm, S1, S2 normal, no murmur, click, rub or gallop Abdomen: soft, non-tender; bowel sounds normal; no masses,  no organomegaly Extremities: extremities normal, atraumatic, no cyanosis or edema Pulses: 2+ and symmetric Skin: Skin color, texture, turgor normal. No rashes or lesions Lymph nodes: Cervical adenopathy: normal and Supraclavicular adenopathy: normal Neurologic: Grossly normal    Assessment:    Healthy female exam.      Plan:  -Will obtain fasting labs. -Patient is followed by OB/GYN for female exam, plans to follow up. -UTD mammogram and colonoscopy.  -Agreeable to influenza and Shingrix vaccine. -Continue with heart healthy diet. Recommend to increase physical activity and stay well hydrated. -Continue current medication regimen.  -Follow up in 6 months for Wt, second Shingrix vaccine, HLD  See  After Visit Summary for Counseling Recommendations

## 2021-09-19 ENCOUNTER — Other Ambulatory Visit (HOSPITAL_COMMUNITY): Payer: Self-pay

## 2021-09-19 LAB — COMPREHENSIVE METABOLIC PANEL
ALT: 7 IU/L (ref 0–32)
AST: 12 IU/L (ref 0–40)
Albumin/Globulin Ratio: 1.5 (ref 1.2–2.2)
Albumin: 4.1 g/dL (ref 3.8–4.9)
Alkaline Phosphatase: 70 IU/L (ref 44–121)
BUN/Creatinine Ratio: 12 (ref 9–23)
BUN: 12 mg/dL (ref 6–24)
Bilirubin Total: 0.3 mg/dL (ref 0.0–1.2)
CO2: 23 mmol/L (ref 20–29)
Calcium: 8.9 mg/dL (ref 8.7–10.2)
Chloride: 102 mmol/L (ref 96–106)
Creatinine, Ser: 1.04 mg/dL — ABNORMAL HIGH (ref 0.57–1.00)
Globulin, Total: 2.8 g/dL (ref 1.5–4.5)
Glucose: 90 mg/dL (ref 65–99)
Potassium: 4.3 mmol/L (ref 3.5–5.2)
Sodium: 140 mmol/L (ref 134–144)
Total Protein: 6.9 g/dL (ref 6.0–8.5)
eGFR: 65 mL/min/{1.73_m2} (ref >59)

## 2021-09-19 LAB — LIPID PANEL
Chol/HDL Ratio: 4.2 ratio (ref 0.0–4.4)
Cholesterol, Total: 181 mg/dL (ref 100–199)
HDL: 43 mg/dL (ref >39)
LDL Chol Calc (NIH): 118 mg/dL — ABNORMAL HIGH (ref 0–99)
Triglycerides: 111 mg/dL (ref 0–149)
VLDL Cholesterol Cal: 20 mg/dL (ref 5–40)

## 2021-09-19 LAB — CBC WITH DIFFERENTIAL/PLATELET
Basophils Absolute: 0 10*3/uL (ref 0.0–0.2)
Basos: 1 %
EOS (ABSOLUTE): 0.2 10*3/uL (ref 0.0–0.4)
Eos: 3 %
Hematocrit: 40.6 % (ref 34.0–46.6)
Hemoglobin: 13.3 g/dL (ref 11.1–15.9)
Immature Grans (Abs): 0 10*3/uL (ref 0.0–0.1)
Immature Granulocytes: 0 %
Lymphocytes Absolute: 1.7 10*3/uL (ref 0.7–3.1)
Lymphs: 28 %
MCH: 29.5 pg (ref 26.6–33.0)
MCHC: 32.8 g/dL (ref 31.5–35.7)
MCV: 90 fL (ref 79–97)
Monocytes Absolute: 0.4 10*3/uL (ref 0.1–0.9)
Monocytes: 7 %
Neutrophils Absolute: 3.6 10*3/uL (ref 1.4–7.0)
Neutrophils: 61 %
Platelets: 332 10*3/uL (ref 150–450)
RBC: 4.51 x10E6/uL (ref 3.77–5.28)
RDW: 13.5 % (ref 11.7–15.4)
WBC: 6 10*3/uL (ref 3.4–10.8)

## 2021-09-19 LAB — VITAMIN D 25 HYDROXY (VIT D DEFICIENCY, FRACTURES): Vit D, 25-Hydroxy: 30.1 ng/mL (ref 30.0–100.0)

## 2021-09-19 LAB — TSH: TSH: 0.856 u[IU]/mL (ref 0.450–4.500)

## 2021-09-19 LAB — HEMOGLOBIN A1C
Est. average glucose Bld gHb Est-mCnc: 114 mg/dL
Hgb A1c MFr Bld: 5.6 % (ref 4.8–5.6)

## 2021-10-07 ENCOUNTER — Other Ambulatory Visit: Payer: Self-pay | Admitting: Physician Assistant

## 2021-10-07 ENCOUNTER — Other Ambulatory Visit (HOSPITAL_COMMUNITY): Payer: Self-pay

## 2021-10-07 DIAGNOSIS — J4540 Moderate persistent asthma, uncomplicated: Secondary | ICD-10-CM

## 2021-10-07 DIAGNOSIS — J3089 Other allergic rhinitis: Secondary | ICD-10-CM

## 2021-10-07 MED ORDER — MONTELUKAST SODIUM 5 MG PO CHEW
CHEWABLE_TABLET | ORAL | 0 refills | Status: DC
  Filled 2021-10-07: qty 180, 90d supply, fill #0

## 2021-10-09 ENCOUNTER — Other Ambulatory Visit (HOSPITAL_COMMUNITY): Payer: Self-pay

## 2021-10-17 ENCOUNTER — Other Ambulatory Visit (HOSPITAL_COMMUNITY): Payer: Self-pay

## 2021-11-10 ENCOUNTER — Other Ambulatory Visit (HOSPITAL_COMMUNITY): Payer: Self-pay

## 2021-11-11 ENCOUNTER — Other Ambulatory Visit (HOSPITAL_COMMUNITY): Payer: Self-pay

## 2021-11-11 DIAGNOSIS — F411 Generalized anxiety disorder: Secondary | ICD-10-CM | POA: Diagnosis not present

## 2021-11-11 DIAGNOSIS — F319 Bipolar disorder, unspecified: Secondary | ICD-10-CM | POA: Diagnosis not present

## 2021-11-11 DIAGNOSIS — G4700 Insomnia, unspecified: Secondary | ICD-10-CM | POA: Diagnosis not present

## 2021-11-11 MED ORDER — HYDROXYZINE PAMOATE 25 MG PO CAPS
25.0000 mg | ORAL_CAPSULE | Freq: Two times a day (BID) | ORAL | 2 refills | Status: DC | PRN
  Filled 2021-11-11: qty 60, 30d supply, fill #0

## 2021-11-12 ENCOUNTER — Other Ambulatory Visit (HOSPITAL_COMMUNITY): Payer: Self-pay

## 2021-11-24 ENCOUNTER — Other Ambulatory Visit (HOSPITAL_COMMUNITY): Payer: Self-pay

## 2021-12-19 ENCOUNTER — Other Ambulatory Visit (HOSPITAL_COMMUNITY): Payer: Self-pay

## 2021-12-23 ENCOUNTER — Other Ambulatory Visit (HOSPITAL_COMMUNITY): Payer: Self-pay

## 2021-12-30 ENCOUNTER — Other Ambulatory Visit (HOSPITAL_COMMUNITY): Payer: Self-pay

## 2022-01-15 ENCOUNTER — Other Ambulatory Visit (HOSPITAL_COMMUNITY): Payer: Self-pay

## 2022-01-15 ENCOUNTER — Other Ambulatory Visit: Payer: Self-pay | Admitting: Physician Assistant

## 2022-01-15 DIAGNOSIS — J3089 Other allergic rhinitis: Secondary | ICD-10-CM

## 2022-01-15 DIAGNOSIS — J4540 Moderate persistent asthma, uncomplicated: Secondary | ICD-10-CM

## 2022-01-15 MED ORDER — MONTELUKAST SODIUM 5 MG PO CHEW
CHEWABLE_TABLET | ORAL | 1 refills | Status: DC
Start: 2022-01-15 — End: 2022-07-16
  Filled 2022-01-15: qty 180, 90d supply, fill #0
  Filled 2022-04-17: qty 180, 90d supply, fill #1

## 2022-01-28 ENCOUNTER — Other Ambulatory Visit: Payer: Self-pay

## 2022-01-28 ENCOUNTER — Encounter: Payer: Self-pay | Admitting: Nurse Practitioner

## 2022-01-28 ENCOUNTER — Ambulatory Visit (INDEPENDENT_AMBULATORY_CARE_PROVIDER_SITE_OTHER): Payer: 59 | Admitting: Nurse Practitioner

## 2022-01-28 ENCOUNTER — Other Ambulatory Visit (HOSPITAL_COMMUNITY): Payer: Self-pay

## 2022-01-28 VITALS — Temp 100.0°F | Ht 63.0 in | Wt 140.0 lb

## 2022-01-28 DIAGNOSIS — J069 Acute upper respiratory infection, unspecified: Secondary | ICD-10-CM | POA: Insufficient documentation

## 2022-01-28 DIAGNOSIS — U071 COVID-19: Secondary | ICD-10-CM | POA: Diagnosis not present

## 2022-01-28 DIAGNOSIS — R051 Acute cough: Secondary | ICD-10-CM | POA: Diagnosis not present

## 2022-01-28 MED ORDER — MOLNUPIRAVIR EUA 200MG CAPSULE
4.0000 | ORAL_CAPSULE | Freq: Two times a day (BID) | ORAL | 0 refills | Status: AC
  Filled 2022-01-28: qty 40, 5d supply, fill #0

## 2022-01-28 MED ORDER — HYDROCOD POLI-CHLORPHE POLI ER 10-8 MG/5ML PO SUER
5.0000 mL | Freq: Two times a day (BID) | ORAL | 0 refills | Status: DC | PRN
  Filled 2022-01-28: qty 115, 12d supply, fill #0

## 2022-01-28 NOTE — Progress Notes (Signed)
Virtual Visit via Telephone Note  I connected with Shelia Ramos on 01/28/22 at  1:50 PM EST by telephone and verified that I am speaking with the correct person using two identifiers.  Location: Patient: home Provider:  primary care at [facility]     I discussed the limitations, risks, security and privacy concerns of performing an evaluation and management service by telephone and the availability of in person appointments. I also discussed with the patient that there may be a patient responsible charge related to this service. The patient expressed understanding and agreed to proceed.   History of Present Illness: The patient states that she started having chills, cough, and nasal congestion yesterday. Cough kept her awake all night last night. She had fever of 100. She also has a headache. She denies nausea, vomiting, or diarrhea. She denies sore throat. She states that she was tested for COVID 19 at work and result was positive. She states that her husband tested positive for COVID 19 on Monday. She has a COVID risk score of 1.    Observations/Objective:  The patient is alert and oriented. She is pleasant and answers all questions appropriately. Breathing is non-labored. She is in no acute distress at this time.  She sounds nasally congested. She has a loose sounding cough.   Today's Vitals   01/28/22 1341  Temp: 100 F (37.8 C)  Weight: 140 lb (63.5 kg)  Height: 5\' 3"  (1.6 m)   Body mass index is 24.8 kg/m.    Assessment and Plan: 1. Upper respiratory tract infection due to COVID-19 virus Start molnupiravir - take 800mg  twice daily for next five days. Recommend she take OTC zinc, vitamin d, and vitamin c every day. Can also use otc medications as needed and as indicated for symptom management. Isolation and quarantine instructions were reviewed. Patient voiced understanding and agreement.  - molnupiravir EUA (LAGEVRIO) 200 mg CAPS capsule; Take 4 capsules (800  mg total) by mouth 2 (two) times daily for 5 days.  Dispense: 40 capsule; Refill: 0  2. Acute cough Prescribed tussionex cough suppressant to take twice daily as needed for cough. Recommended she use at night and when at home only, as this medication can cause significant drowsiness and dissiness.   - chlorpheniramine-HYDROcodone (TUSSIONEX PENNKINETIC ER) 10-8 MG/5ML; Take 5 mLs by mouth every 12 (twelve) hours as needed for cough.  Dispense: 115 mL; Refill: 0   Follow Up Instructions:    I discussed the assessment and treatment plan with the patient. The patient was provided an opportunity to ask questions and all were answered. The patient agreed with the plan and demonstrated an understanding of the instructions.   The patient was advised to call back or seek an in-person evaluation if the symptoms worsen or if the condition fails to improve as anticipated.  I provided 10 minutes of non-face-to-face time during this encounter.   , NP

## 2022-01-29 ENCOUNTER — Other Ambulatory Visit (HOSPITAL_COMMUNITY): Payer: Self-pay

## 2022-01-30 ENCOUNTER — Other Ambulatory Visit (HOSPITAL_COMMUNITY): Payer: Self-pay

## 2022-02-07 ENCOUNTER — Other Ambulatory Visit (HOSPITAL_COMMUNITY): Payer: Self-pay

## 2022-03-14 ENCOUNTER — Other Ambulatory Visit (HOSPITAL_COMMUNITY): Payer: Self-pay

## 2022-03-16 ENCOUNTER — Other Ambulatory Visit (HOSPITAL_COMMUNITY): Payer: Self-pay

## 2022-03-16 ENCOUNTER — Encounter: Payer: Self-pay | Admitting: Physician Assistant

## 2022-03-17 ENCOUNTER — Other Ambulatory Visit (HOSPITAL_COMMUNITY): Payer: Self-pay

## 2022-03-18 ENCOUNTER — Other Ambulatory Visit (HOSPITAL_COMMUNITY): Payer: Self-pay

## 2022-03-18 NOTE — Progress Notes (Signed)
?Established patient visit ? ? ?Patient: Shelia Ramos   DOB: 1971/05/05   51 y.o. Female  MRN: [id number] ?Visit Date: 03/19/2022 ? ?Chief Complaint  ?Patient presents with  ? Follow-up  ?  Mood  ? Hyperlipidemia  ? ?Subjective  ?  ?HPI ?HPI   ? ? Follow-up   ? Additional comments: Mood ? ?  ?  ?Last edited by Aron Baba, CMA on 03/19/2022  8:30 AM.  ?  ?  ?Patient presents for follow up on hyperlipidemia, weight and second shingles vaccine. Patient report still having a cough post Covid which is worse at night and interferes with sleep.  Cough is mostly dry. No fever, shortness of breath. Does report mild wheezing.  ? ?HLD: Pt managing with diet. Does not eat red meat or fried foods. Follows vegetarian diet. ? ?Weight: Patient has gained some weight. Reports ? ?Mood: Followed by psychiatry. Reports does have ups and downs with her mood. Does report some intermittent stress. Taking medications as directed.  ? ? ? ?  03/19/2022  ?  9:05 AM 01/28/2022  ?  1:42 PM 09/18/2021  ?  8:43 AM 01/09/2021  ?  4:01 PM 06/24/2020  ?  1:52 PM  ?Depression screen PHQ 2/9  ?Decreased Interest 0 1 0 0 0  ?Down, Depressed, Hopeless 0 0 0 0 0  ?PHQ - 2 Score 0 1 0 0 0  ?Altered sleeping 0 1 1 1 1   ?Tired, decreased energy 0 1 0 1 1  ?Change in appetite 0 0 0 0 0  ?Feeling bad or failure about yourself  0 0 0 0 0  ?Trouble concentrating 0 0 0 0 0  ?Moving slowly or fidgety/restless 0 0 0 0 0  ?Suicidal thoughts 0 0 0 0 0  ?PHQ-9 Score 0 3 1 2 2   ?Difficult doing work/chores Not difficult at all   Not difficult at all Not difficult at all  ? ? ?  03/19/2022  ?  9:05 AM 01/28/2022  ?  1:44 PM 09/18/2021  ?  8:44 AM 05/30/2019  ?  3:31 PM  ?GAD 7 : Generalized Anxiety Score  ?Nervous, Anxious, on Edge 0 0 0 0  ?Control/stop worrying 0 0 0 0  ?Worry too much - different things 0 0 0 0  ?Trouble relaxing 0 0 0 0  ?Restless 0 0 0 0  ?Easily annoyed or irritable 0 0 0 0  ?Afraid - awful might happen 0 0 0 0  ?Total GAD 7 Score 0 0 0 0   ?Anxiety Difficulty Not difficult at all   Not difficult at all  ? ? ? ?Medications: ?Outpatient Medications Prior to Visit  ?Medication Sig  ? benztropine (COGENTIN) 0.5 MG tablet Take 1 tablet by mouth 2 times a day  ? Cetirizine HCl 10 MG CAPS Take 10 mg by mouth daily as needed.  ? hydrOXYzine (ATARAX/VISTARIL) 25 MG tablet Take 25 mg by mouth 2 (two) times daily as needed.   ? hydrOXYzine (VISTARIL) 25 MG capsule Take 1 capsule (25 mg total) by mouth 2 (two) times daily as needed.  ? Hyoscyamine Sulfate SL 0.125 MG SUBL PLACE 1 TABLET UNDER THE TONGUE AT BEDTIME.  ? levonorgestrel (MIRENA, 52 MG,) 20 MCG/DAY IUD Mirena 20 mcg/24 hours (8 yrs) 52 mg intrauterine device ? Take 1 device by intrauterine route.  ? mirtazapine (REMERON) 30 MG tablet Take 1/2 - 1 tablet by mouth at bedtime  ? montelukast (SINGULAIR) 5 MG chewable  tablet CHEW 2 TABLETS BY MOUTH EVERY EVENING.  ? omeprazole (PRILOSEC) 20 MG capsule TAKE 1 CAPSULE BY MOUTH DAILY  ? risperiDONE (RISPERDAL) 3 MG tablet Take 1 tablet (3 mg total) by mouth every evening with dinner  ? [DISCONTINUED] albuterol (PROAIR HFA) 108 (90 Base) MCG/ACT inhaler Inhale 2 puffs into the lungs every 4 (four) hours as needed for wheezing or shortness of breath.  ? [DISCONTINUED] benztropine (COGENTIN) 0.5 MG tablet Take 1 tablet (0.5 mg total) by mouth 2 (two) times daily.  ? [DISCONTINUED] chlorpheniramine-HYDROcodone (TUSSIONEX PENNKINETIC ER) 10-8 MG/5ML Take 5 mLs by mouth every 12 (twelve) hours as needed for cough.  ? [DISCONTINUED] risperiDONE (RISPERDAL) 3 MG tablet TAKE 1 TABLET BY MOUTH EVERY EVENING WITH DINNER.  ? ?No facility-administered medications prior to visit.  ? ? ?Review of Systems ?Review of Systems:  ?A fourteen system review of systems was performed and found to be positive as per HPI. ? ? ?  Objective  ?  ?BP 96/66   Pulse (!) 114   Temp 98.3 ?F (36.8 ?C)   Ht 5' 3"  (1.6 m)   Wt 146 lb (66.2 kg)   SpO2 98%   BMI 25.86 kg/m?  ?BP Readings  from Last 3 Encounters:  ?03/19/22 96/66  ?09/18/21 90/61  ?09/09/21 106/72  ? ?Wt Readings from Last 3 Encounters:  ?03/19/22 146 lb (66.2 kg)  ?01/28/22 140 lb (63.5 kg)  ?09/18/21 140 lb 12.8 oz (63.9 kg)  ? ? ?Physical Exam  ?General:  Well Developed, well nourished, appropriate for stated age.  ?Neuro:  Alert and oriented,  extra-ocular muscles intact  ?HEENT:  Normocephalic, atraumatic, neck supple  ?Skin:  no gross rash, warm, pink. ?Cardiac:  RRR, S1 S2 ?Respiratory: CTA B/L w/o wheezing, crackles or rales. ?Vascular:  Ext warm, no cyanosis apprec.; cap RF less 2 sec. ?Psych:  No HI/SI, judgement and insight good, Euthymic mood. Full Affect. ? ? ?No results found for any visits on 03/19/22. ? Assessment & Plan  ?  ? ? ?Problem List Items Addressed This Visit   ? ?  ? Respiratory  ? Moderate persistent asthma-  (Chronic)  ?  -Stable. No adventitious sounds on exam. ?-Continue albuterol as needed. ?-Will continue to monitor. ?  ?  ? Relevant Medications  ? albuterol (PROAIR HFA) 108 (90 Base) MCG/ACT inhaler  ?  ? Other  ? Bipolar disorder, curr episode depressed, severe, w/psychotic features ([place])  ?  -Followed by psychiatry. No SI/HI. ?-Recommend to continue current medication regimen. ?  ?  ? Hyperlipidemia - Primary  ?  -Will repeat lipid panel today. Last LDL 118. ?-Continue heart healthy diet. ?-Will continue to monitor. ?  ?  ? Relevant Orders  ? Lipid Profile  ? Comp Met (CMET)  ? Low HDL (under 40)  ?  -Last HDL 43, discussed increasing exercise. ?-Will repeat lipid panel today. ?-Will continue to monitor. ?  ?  ? Relevant Orders  ? Comp Met (CMET)  ? ?Other Visit Diagnoses   ? ? Need for shingles vaccine      ? Relevant Orders  ? Varicella-zoster vaccine IM (Completed)  ? Post-COVID chronic cough      ? Relevant Medications  ? chlorpheniramine-HYDROcodone (TUSSIONEX PENNKINETIC ER) 10-8 MG/5ML  ? Overweight with body mass index (BMI) of 25 to 25.9 in adult      ? ?  ? ?Overweight with body mass index  (BMI) of 25 to 25.9 in adult: ?-Associated with hyperlipidemia and  Vit D deficiency. ?-Discussed with patient diet and lifestyle changes.  ? ?Post-COVID chronic cough: ?-Reassurance provided. Will provide one time refill of Tussionex.  ? ? ?Return in about 6 months (around 09/19/2022) for CPE and FBW.  ?   ? ? ? ?Lorrene Reid, PA-C  ?[facility] ?[phone] (phone) ?[phone] (fax) ? ?[facility] ?

## 2022-03-19 ENCOUNTER — Ambulatory Visit: Payer: 59 | Admitting: Physician Assistant

## 2022-03-19 ENCOUNTER — Other Ambulatory Visit: Payer: Self-pay

## 2022-03-19 ENCOUNTER — Other Ambulatory Visit (HOSPITAL_COMMUNITY): Payer: Self-pay

## 2022-03-19 ENCOUNTER — Encounter: Payer: Self-pay | Admitting: Physician Assistant

## 2022-03-19 VITALS — BP 96/66 | HR 114 | Temp 98.3°F | Ht 63.0 in | Wt 146.0 lb

## 2022-03-19 DIAGNOSIS — E785 Hyperlipidemia, unspecified: Secondary | ICD-10-CM

## 2022-03-19 DIAGNOSIS — Z23 Encounter for immunization: Secondary | ICD-10-CM

## 2022-03-19 DIAGNOSIS — F315 Bipolar disorder, current episode depressed, severe, with psychotic features: Secondary | ICD-10-CM

## 2022-03-19 DIAGNOSIS — U099 Post covid-19 condition, unspecified: Secondary | ICD-10-CM

## 2022-03-19 DIAGNOSIS — R053 Chronic cough: Secondary | ICD-10-CM | POA: Diagnosis not present

## 2022-03-19 DIAGNOSIS — J4540 Moderate persistent asthma, uncomplicated: Secondary | ICD-10-CM

## 2022-03-19 DIAGNOSIS — E786 Lipoprotein deficiency: Secondary | ICD-10-CM

## 2022-03-19 DIAGNOSIS — E663 Overweight: Secondary | ICD-10-CM | POA: Diagnosis not present

## 2022-03-19 DIAGNOSIS — Z6825 Body mass index (BMI) 25.0-25.9, adult: Secondary | ICD-10-CM

## 2022-03-19 MED ORDER — ALBUTEROL SULFATE HFA 108 (90 BASE) MCG/ACT IN AERS
2.0000 | INHALATION_SPRAY | RESPIRATORY_TRACT | 1 refills | Status: DC | PRN
  Filled 2022-03-19: qty 18, 16d supply, fill #0

## 2022-03-19 MED ORDER — HYDROCOD POLI-CHLORPHE POLI ER 10-8 MG/5ML PO SUER
5.0000 mL | Freq: Every evening | ORAL | 0 refills | Status: DC | PRN
  Filled 2022-03-19: qty 70, 14d supply, fill #0

## 2022-03-19 NOTE — Assessment & Plan Note (Addendum)
>>  ASSESSMENT AND PLAN FOR HYPERLIPIDEMIA WRITTEN ON 03/19/2022  9:05 AM BY Sinai Mahany, PA-C  -Will repeat lipid panel today. Last LDL 118. -Continue heart healthy diet. -Will continue to monitor.  >>ASSESSMENT AND PLAN FOR LOW HDL (UNDER 40) WRITTEN ON 1/61/0960  9:06 AM BY Summer Parthasarathy, PA-C  -Last HDL 43, discussed increasing exercise. -Will repeat lipid panel today. -Will continue to monitor.

## 2022-03-19 NOTE — Assessment & Plan Note (Signed)
-  Followed by psychiatry. No SI/HI. ?-Recommend to continue current medication regimen. ?

## 2022-03-19 NOTE — Assessment & Plan Note (Signed)
-  Last HDL 43, discussed increasing exercise. ?-Will repeat lipid panel today. ?-Will continue to monitor. ?

## 2022-03-19 NOTE — Assessment & Plan Note (Signed)
-  Stable. No adventitious sounds on exam. ?-Continue albuterol as needed. ?-Will continue to monitor. ?

## 2022-03-20 LAB — COMPREHENSIVE METABOLIC PANEL
ALT: 9 IU/L (ref 0–32)
AST: 12 IU/L (ref 0–40)
Albumin/Globulin Ratio: 1.4 (ref 1.2–2.2)
Albumin: 4.1 g/dL (ref 3.8–4.9)
Alkaline Phosphatase: 74 IU/L (ref 44–121)
BUN/Creatinine Ratio: 12 (ref 9–23)
BUN: 12 mg/dL (ref 6–24)
Bilirubin Total: 0.2 mg/dL (ref 0.0–1.2)
CO2: 23 mmol/L (ref 20–29)
Calcium: 9.7 mg/dL (ref 8.7–10.2)
Chloride: 104 mmol/L (ref 96–106)
Creatinine, Ser: 0.97 mg/dL (ref 0.57–1.00)
Globulin, Total: 2.9 g/dL (ref 1.5–4.5)
Glucose: 103 mg/dL — ABNORMAL HIGH (ref 70–99)
Potassium: 4.1 mmol/L (ref 3.5–5.2)
Sodium: 140 mmol/L (ref 134–144)
Total Protein: 7 g/dL (ref 6.0–8.5)
eGFR: 70 mL/min/{1.73_m2} (ref >59)

## 2022-03-20 LAB — LIPID PANEL
Chol/HDL Ratio: 4.2 ratio (ref 0.0–4.4)
Cholesterol, Total: 195 mg/dL (ref 100–199)
HDL: 46 mg/dL (ref >39)
LDL Chol Calc (NIH): 135 mg/dL — ABNORMAL HIGH (ref 0–99)
Triglycerides: 77 mg/dL (ref 0–149)
VLDL Cholesterol Cal: 14 mg/dL (ref 5–40)

## 2022-03-25 ENCOUNTER — Other Ambulatory Visit (HOSPITAL_COMMUNITY): Payer: Self-pay

## 2022-04-07 DIAGNOSIS — F411 Generalized anxiety disorder: Secondary | ICD-10-CM | POA: Diagnosis not present

## 2022-04-07 DIAGNOSIS — F319 Bipolar disorder, unspecified: Secondary | ICD-10-CM | POA: Diagnosis not present

## 2022-04-07 DIAGNOSIS — G4700 Insomnia, unspecified: Secondary | ICD-10-CM | POA: Diagnosis not present

## 2022-04-14 ENCOUNTER — Other Ambulatory Visit (HOSPITAL_COMMUNITY): Payer: Self-pay

## 2022-04-14 DIAGNOSIS — Z124 Encounter for screening for malignant neoplasm of cervix: Secondary | ICD-10-CM | POA: Diagnosis not present

## 2022-04-14 DIAGNOSIS — Z01419 Encounter for gynecological examination (general) (routine) without abnormal findings: Secondary | ICD-10-CM | POA: Diagnosis not present

## 2022-04-14 MED ORDER — ESTRADIOL 0.5 MG PO TABS
ORAL_TABLET | ORAL | 4 refills | Status: DC
  Filled 2022-04-14: qty 90, 90d supply, fill #0

## 2022-04-17 ENCOUNTER — Other Ambulatory Visit (HOSPITAL_COMMUNITY): Payer: Self-pay

## 2022-04-18 ENCOUNTER — Other Ambulatory Visit (HOSPITAL_COMMUNITY): Payer: Self-pay

## 2022-04-22 ENCOUNTER — Ambulatory Visit (INDEPENDENT_AMBULATORY_CARE_PROVIDER_SITE_OTHER): Payer: 59

## 2022-04-22 ENCOUNTER — Encounter: Payer: Self-pay | Admitting: Podiatry

## 2022-04-22 ENCOUNTER — Ambulatory Visit: Payer: 59 | Admitting: Podiatry

## 2022-04-22 DIAGNOSIS — M21611 Bunion of right foot: Secondary | ICD-10-CM

## 2022-04-22 DIAGNOSIS — L84 Corns and callosities: Secondary | ICD-10-CM

## 2022-04-22 DIAGNOSIS — M216X2 Other acquired deformities of left foot: Secondary | ICD-10-CM | POA: Diagnosis not present

## 2022-04-22 DIAGNOSIS — M21619 Bunion of unspecified foot: Secondary | ICD-10-CM

## 2022-04-22 LAB — HM PAP SMEAR: HM Pap smear: NEGATIVE

## 2022-04-22 NOTE — Progress Notes (Signed)
?  Subjective:  ?Patient ID: Shelia Ramos, female    DOB: 1971-06-10,   MRN: [id number] ? ?Chief Complaint  ?Patient presents with  ? Bunions  ?   ?Right foot bunion pain  ? ? ?51 y.o. female presents for concern of right foot bunion pain. Relates in the past she has had this bunion operated on twice and has failed both times. Relates she has some pain that is off and on and will throb. Denies pain today. Relates when in pain nothing really helps . Denies any other pedal complaints. Denies n/v/f/c.  ? ?Past Medical History:  ?Diagnosis Date  ? Allergy   ? SEASONAL  ? Anxiety   ? Asthma   ? AS A CHILD  ? Depression   ? IBS (irritable bowel syndrome)   ? Seasonal allergies   ? ? ?Objective:  ?Physical Exam: ?Vascular: DP/PT pulses 2/4 bilateral. CFT <3 seconds. Normal hair growth on digits. No edema.  ?Skin. No lacerations or abrasions bilateral feet.  ?Musculoskeletal: MMT 5/5 bilateral lower extremities in DF, PF, Inversion and Eversion. Deceased ROM in DF of ankle joint. 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  ?Neurological: Sensation intact to light touch.  ? ?Assessment:  ? ?1. Bunion   ? ? ? ?Plan:  ?Patient was evaluated and treated and all questions answered. ?-Xrays reviewed. Reatined hardware from previous austin aiken bunionectomy and lapidus bunionectomy as well as weil. Recurrence of bunion noted.  ?-Discussed HAV and treatment options;conservative and surgical management; risks, benefits, alternatives discussed. All patient's questions answered. ?-Discussed padding and wide shoe gear.   ?-Recommend continue with good supportive shoes and inserts.  ?-Discussed surgical options. Recommended she will likely need fusion of her first MPJ joint. However I would hold off on any surgery until pain limits daily activity and cannot tolerate anymore.  ?-Patient to return to office as needed or sooner if condition  worsens. ? ? ?Lorenda Peck, DPM  ? ? ?

## 2022-04-23 ENCOUNTER — Other Ambulatory Visit (HOSPITAL_COMMUNITY): Payer: Self-pay

## 2022-04-23 ENCOUNTER — Telehealth: Payer: Self-pay | Admitting: Physician Assistant

## 2022-04-23 ENCOUNTER — Other Ambulatory Visit: Payer: Self-pay

## 2022-04-23 DIAGNOSIS — U099 Post covid-19 condition, unspecified: Secondary | ICD-10-CM

## 2022-04-23 DIAGNOSIS — R053 Chronic cough: Secondary | ICD-10-CM

## 2022-04-23 MED ORDER — HYDROCOD POLI-CHLORPHE POLI ER 10-8 MG/5ML PO SUER
5.0000 mL | Freq: Every evening | ORAL | 0 refills | Status: DC | PRN
  Filled 2022-04-23: qty 70, 14d supply, fill #0

## 2022-04-23 NOTE — Telephone Encounter (Signed)
Patient is requesting cough medication to be called in. States she is coughing a lot at night. Please advise. [phone] ?

## 2022-04-23 NOTE — Telephone Encounter (Signed)
Called the patient and was unable to reach her.  ?

## 2022-05-05 ENCOUNTER — Other Ambulatory Visit (HOSPITAL_COMMUNITY): Payer: Self-pay

## 2022-05-05 MED ORDER — MIRTAZAPINE 30 MG PO TABS
ORAL_TABLET | ORAL | 1 refills | Status: DC
  Filled 2022-05-05: qty 90, 90d supply, fill #0
  Filled 2022-08-08: qty 90, 90d supply, fill #1

## 2022-05-06 ENCOUNTER — Other Ambulatory Visit (HOSPITAL_COMMUNITY): Payer: Self-pay

## 2022-05-07 ENCOUNTER — Other Ambulatory Visit (HOSPITAL_COMMUNITY): Payer: Self-pay

## 2022-05-08 ENCOUNTER — Other Ambulatory Visit (HOSPITAL_COMMUNITY): Payer: Self-pay

## 2022-05-21 ENCOUNTER — Institutional Professional Consult (permissible substitution): Payer: 59 | Admitting: Internal Medicine

## 2022-05-22 ENCOUNTER — Other Ambulatory Visit (HOSPITAL_COMMUNITY): Payer: Self-pay

## 2022-05-29 DIAGNOSIS — Z1231 Encounter for screening mammogram for malignant neoplasm of breast: Secondary | ICD-10-CM | POA: Diagnosis not present

## 2022-05-29 LAB — HM MAMMOGRAPHY

## 2022-06-22 ENCOUNTER — Other Ambulatory Visit (HOSPITAL_COMMUNITY): Payer: Self-pay

## 2022-06-23 ENCOUNTER — Other Ambulatory Visit (HOSPITAL_COMMUNITY): Payer: Self-pay

## 2022-06-23 MED ORDER — RISPERIDONE 3 MG PO TABS
3.0000 mg | ORAL_TABLET | Freq: Every evening | ORAL | 2 refills | Status: DC
  Filled 2022-06-23: qty 90, 90d supply, fill #0
  Filled 2022-09-22: qty 90, 90d supply, fill #1
  Filled 2022-12-29: qty 90, 90d supply, fill #2

## 2022-07-01 DIAGNOSIS — F411 Generalized anxiety disorder: Secondary | ICD-10-CM | POA: Diagnosis not present

## 2022-07-01 DIAGNOSIS — F319 Bipolar disorder, unspecified: Secondary | ICD-10-CM | POA: Diagnosis not present

## 2022-07-01 DIAGNOSIS — G4700 Insomnia, unspecified: Secondary | ICD-10-CM | POA: Diagnosis not present

## 2022-07-16 ENCOUNTER — Other Ambulatory Visit (HOSPITAL_COMMUNITY): Payer: Self-pay

## 2022-07-16 ENCOUNTER — Other Ambulatory Visit: Payer: Self-pay | Admitting: Physician Assistant

## 2022-07-16 DIAGNOSIS — J3089 Other allergic rhinitis: Secondary | ICD-10-CM

## 2022-07-16 DIAGNOSIS — J4540 Moderate persistent asthma, uncomplicated: Secondary | ICD-10-CM

## 2022-07-16 MED ORDER — MONTELUKAST SODIUM 5 MG PO CHEW
CHEWABLE_TABLET | ORAL | 1 refills | Status: DC
  Filled 2022-07-16: qty 180, 90d supply, fill #0

## 2022-07-21 ENCOUNTER — Other Ambulatory Visit (HOSPITAL_COMMUNITY): Payer: Self-pay

## 2022-08-08 ENCOUNTER — Other Ambulatory Visit (HOSPITAL_COMMUNITY): Payer: Self-pay

## 2022-08-24 ENCOUNTER — Other Ambulatory Visit (HOSPITAL_COMMUNITY): Payer: Self-pay

## 2022-08-24 ENCOUNTER — Ambulatory Visit: Payer: 59 | Admitting: Physician Assistant

## 2022-08-24 ENCOUNTER — Encounter: Payer: Self-pay | Admitting: Physician Assistant

## 2022-08-24 VITALS — BP 100/68 | HR 108 | Ht 63.0 in | Wt 143.2 lb

## 2022-08-24 DIAGNOSIS — K219 Gastro-esophageal reflux disease without esophagitis: Secondary | ICD-10-CM

## 2022-08-24 DIAGNOSIS — K581 Irritable bowel syndrome with constipation: Secondary | ICD-10-CM

## 2022-08-24 MED ORDER — OMEPRAZOLE 20 MG PO CPDR
DELAYED_RELEASE_CAPSULE | Freq: Every day | ORAL | 11 refills | Status: AC
  Filled 2022-08-24: qty 30, 30d supply, fill #0

## 2022-08-24 MED ORDER — HYOSCYAMINE SULFATE SL 0.125 MG SL SUBL
SUBLINGUAL_TABLET | SUBLINGUAL | 11 refills | Status: AC
  Filled 2022-08-24: qty 30, 30d supply, fill #0
  Filled 2022-09-26: qty 30, 30d supply, fill #1
  Filled 2022-11-05: qty 30, 30d supply, fill #2
  Filled 2022-12-12: qty 30, 30d supply, fill #3
  Filled 2023-01-11: qty 30, 30d supply, fill #4
  Filled 2023-02-08: qty 30, 30d supply, fill #5
  Filled 2023-03-20: qty 30, 30d supply, fill #6
  Filled 2023-04-23: qty 30, 30d supply, fill #7

## 2022-08-24 NOTE — Patient Instructions (Signed)
If you are age 53 or younger, your body mass index should be between 19-25. Your Body mass index is 25.37 kg/m. If this is out of the aformentioned range listed, please consider follow up with your Primary Care Provider.  ________________________________________________________  The [facility] providers would like to encourage you to use [facility] to communicate with providers for non-urgent requests or questions.  Due to long hold times on the telephone, sending your provider a message by [facility] may be a faster and more efficient way to get a response.  Please allow 48 business hours for a response.  Please remember that this is for non-urgent requests.  _______________________________________________________  We have sent refills of of your Omeprazole and Hyoscyamine to your pharmacy.  Try Benefiber daily as directed.  You may also try Miralax 1 capful in 8 ounces of water or juice as needed for constipation.  Follow up in 1 year or sooner as needed.  Thank you for entrusting me with your care and choosing [facility].  Amy Esterwood, PA-C

## 2022-08-24 NOTE — Progress Notes (Signed)
Subjective:    Patient ID: Shelia Ramos, female    DOB: 29-Mar-1971, 51 y.o.   MRN: [id number]  HPI Shelia Ramos is a pleasant 51 year old white female, established with Dr. Lavon Paganini and also known to myself.  She was last seen in September 2022 and comes in today for routine annual follow-up.  She has been seen for chronic GERD, and IBS constipation predominant. She had undergone colonoscopy in 2015 which was negative and plan is for 10-year interval follow-up. She says she has been doing well over the past year.  She has a prescription for omeprazole 20 mg which she says she usually takes at nighttime and only if needed for heartburn and indigestion which usually occurs after dietary indiscretion.  In early is not bothered by daytime symptoms, and has no complaints of dysphagia or odynophagia. No current complaints of abdominal pain She also uses Levsin sublingual usually at bedtime for abdominal cramping.  She admits she has not been faithful with keeping up with fiber supplementation or using MiraLAX and is still struggling with constipation which is not severe.  She sometimes has hard stools and says that she will usually go a couple of days between bowel movements.  No complaints of melena or hematochezia.  Review of Systems Pertinent positive and negative review of systems were noted in the above HPI section.  All other review of systems was otherwise negative.   Outpatient Encounter Medications as of 08/24/2022  Medication Sig   albuterol (PROAIR HFA) 108 (90 Base) MCG/ACT inhaler Inhale 2 puffs into the lungs every 4 (four) hours as needed for wheezing or shortness of breath.   benztropine (COGENTIN) 0.5 MG tablet Take 1 tablet by mouth 2 times a day   Cetirizine HCl 10 MG CAPS Take 10 mg by mouth daily as needed.   chlorpheniramine-HYDROcodone (TUSSIONEX PENNKINETIC ER) 10-8 MG/5ML Take 5 mLs by mouth at bedtime as needed for cough.   estradiol (ESTRACE) 0.5 MG tablet Take 1 tablet by  mouth every day   hydrOXYzine (ATARAX/VISTARIL) 25 MG tablet Take 25 mg by mouth 2 (two) times daily as needed.    hydrOXYzine (VISTARIL) 25 MG capsule Take 1 capsule (25 mg total) by mouth 2 (two) times daily as needed.   levonorgestrel (MIRENA, 52 MG,) 20 MCG/DAY IUD Mirena 20 mcg/24 hours (8 yrs) 52 mg intrauterine device  Take 1 device by intrauterine route.   mirtazapine (REMERON) 30 MG tablet Take 1/2 - 1 tablet by mouth at bedtime   montelukast (SINGULAIR) 5 MG chewable tablet CHEW 2 TABLETS BY MOUTH EVERY EVENING.   risperiDONE (RISPERDAL) 3 MG tablet Take 1 tablet (3 mg total) by mouth every evening with dinner   [DISCONTINUED] Hyoscyamine Sulfate SL 0.125 MG SUBL PLACE 1 TABLET UNDER THE TONGUE AT BEDTIME.   [DISCONTINUED] omeprazole (PRILOSEC) 20 MG capsule TAKE 1 CAPSULE BY MOUTH DAILY   Hyoscyamine Sulfate SL 0.125 MG SUBL PLACE 1 TABLET UNDER THE TONGUE AT BEDTIME.   omeprazole (PRILOSEC) 20 MG capsule TAKE 1 CAPSULE BY MOUTH DAILY   No facility-administered encounter medications on file as of 08/24/2022.   No Known Allergies Patient Active Problem List   Diagnosis Date Noted   Upper respiratory tract infection due to COVID-19 virus 01/28/2022   Acute cough 01/28/2022   Vitamin D deficiency 05/30/2019   Environmental and seasonal allergies 05/30/2019   Hyperlipidemia 08/25/2018   Low HDL (under 40) 08/25/2018   Health education/counseling 06/02/2018   Bipolar disorder, curr episode depressed, severe, w/psychotic  features (HCC) 05/03/2017   Bunion of great toe of left foot 10/09/2016   IBS (irritable bowel syndrome) 10/09/2016   Perimenopause- on Xulane 10/09/2016   Insomnia disorder 10/09/2016   Moderate persistent asthma-  09/28/2016   Allergic rhinoconjunctivitis 09/28/2016   Dysphagia 09/28/2016   Constipation 01/03/2015   Internal bleeding hemorrhoids 01/03/2015   Anal bleeding 01/03/2015   Social History   Socioeconomic History   Marital status: Married     Spouse name: Not on file   Number of children: 1   Years of education: Not on file   Highest education level: Not on file  Occupational History   Not on file  Tobacco Use   Smoking status: Never   Smokeless tobacco: Never  Vaping Use   Vaping Use: Never used  Substance and Sexual Activity   Alcohol use: No    Alcohol/week: 0.0 standard drinks of alcohol   Drug use: No   Sexual activity: Not Currently    Birth control/protection: I.U.D.  Other Topics Concern   Not on file  Social History Narrative   Not on file   Social Determinants of Health   Financial Resource Strain: Not on file  Food Insecurity: Not on file  Transportation Needs: Not on file  Physical Activity: Not on file  Stress: Not on file  Social Connections: Not on file  Intimate Partner Violence: Not on file    Shelia Ramos's family history includes Allergic rhinitis in her father, paternal aunt, and paternal grandmother; Anxiety disorder in her mother; Cancer in her paternal grandmother; Depression in her mother; Heart attack in her maternal grandfather.      Objective:    Vitals:   08/24/22 0943  BP: 100/68  Pulse: (!) 108    Physical Exam Well-developed well-nourished WF in no acute distress.  Height, Weight, 143  BMI 25.3  HEENT; nontraumatic normocephalic, EOMI, PE R LA, sclera anicteric. Oropharynx;not done Neck; supple, no JVD Cardiovascular; regular rate and rhythm with S1-S2, no murmur rub or gallop Pulmonary; Clear bilaterally Abdomen; soft, nontender, nondistended, no palpable mass or hepatosplenomegaly, bowel sounds are active Rectal;not done today  Skin; benign exam, no jaundice rash or appreciable lesions Extremities; no clubbing cyanosis or edema skin warm and dry Neuro/Psych; alert and oriented x4, grossly nonfocal mood and affect appropriate        Assessment & Plan:   #23 51 year old white female with history of IBS-C, with mild constipation. We had previously discussed use of  Benefiber on a daily basis, and MiraLAX 17 g in 8 ounces of water as needed for constipation.  She has not been using either of these regularly. Does use Levsin sublingual most nights to avoid abdominal cramping.  #2 GERD-does well with as needed use of omeprazole 20 mg, and usually has symptoms related to dietary indiscretion, no chronic daily symptoms at present and no dysphagia or odynophagia. #3 Colon cancer screening-up-to-date with negative colonoscopy 2015 indicated for 10-year interval follow-up #4.  Bipolar disorder #5 asthma  Plan; refill omeprazole 20 mg p.o. every afternoon, advise she take in the evening rather than nightly as she is using it on a as needed basis Refill Levsin sublingual dissolve 1 on tongue every 6 hours as needed for abdominal cramping, generally using nightly Start Benefiber 1 dose daily in a glass of water or juice, and start MiraLAX 17 g in 8 ounces of water as needed if no bowel movement in 2 days  Patient will follow-up in 1 year, or sooner  as needed with Dr. Lavon Paganini or myself.  Ritvik Mczeal S Erasmus Bistline PA-C 08/24/2022   Cc: Mayer Masker, PA-C

## 2022-08-26 ENCOUNTER — Other Ambulatory Visit (HOSPITAL_COMMUNITY): Payer: Self-pay

## 2022-08-29 ENCOUNTER — Other Ambulatory Visit (HOSPITAL_COMMUNITY): Payer: Self-pay

## 2022-09-22 ENCOUNTER — Other Ambulatory Visit (HOSPITAL_COMMUNITY): Payer: Self-pay

## 2022-09-26 ENCOUNTER — Other Ambulatory Visit (HOSPITAL_COMMUNITY): Payer: Self-pay

## 2022-10-01 ENCOUNTER — Other Ambulatory Visit (HOSPITAL_COMMUNITY): Payer: Self-pay

## 2022-10-02 ENCOUNTER — Other Ambulatory Visit (HOSPITAL_COMMUNITY): Payer: Self-pay

## 2022-10-05 ENCOUNTER — Other Ambulatory Visit (HOSPITAL_COMMUNITY): Payer: Self-pay

## 2022-10-07 DIAGNOSIS — G4700 Insomnia, unspecified: Secondary | ICD-10-CM | POA: Diagnosis not present

## 2022-10-07 DIAGNOSIS — F319 Bipolar disorder, unspecified: Secondary | ICD-10-CM | POA: Diagnosis not present

## 2022-10-07 DIAGNOSIS — F411 Generalized anxiety disorder: Secondary | ICD-10-CM | POA: Diagnosis not present

## 2022-10-08 ENCOUNTER — Other Ambulatory Visit (HOSPITAL_COMMUNITY): Payer: Self-pay

## 2022-10-08 MED ORDER — BENZTROPINE MESYLATE 0.5 MG PO TABS
0.5000 mg | ORAL_TABLET | Freq: Two times a day (BID) | ORAL | 3 refills | Status: DC
  Filled 2022-10-08: qty 180, 90d supply, fill #0
  Filled 2023-04-23: qty 180, 90d supply, fill #1

## 2022-10-09 ENCOUNTER — Other Ambulatory Visit (HOSPITAL_COMMUNITY): Payer: Self-pay

## 2022-10-30 ENCOUNTER — Other Ambulatory Visit (HOSPITAL_COMMUNITY): Payer: Self-pay

## 2022-10-30 ENCOUNTER — Ambulatory Visit (INDEPENDENT_AMBULATORY_CARE_PROVIDER_SITE_OTHER): Payer: 59 | Admitting: Physician Assistant

## 2022-10-30 ENCOUNTER — Encounter: Payer: Self-pay | Admitting: Physician Assistant

## 2022-10-30 VITALS — BP 111/77 | HR 98 | Resp 18 | Ht 63.0 in | Wt 143.4 lb

## 2022-10-30 DIAGNOSIS — Z6825 Body mass index (BMI) 25.0-25.9, adult: Secondary | ICD-10-CM

## 2022-10-30 DIAGNOSIS — H6121 Impacted cerumen, right ear: Secondary | ICD-10-CM

## 2022-10-30 DIAGNOSIS — E785 Hyperlipidemia, unspecified: Secondary | ICD-10-CM | POA: Diagnosis not present

## 2022-10-30 DIAGNOSIS — E559 Vitamin D deficiency, unspecified: Secondary | ICD-10-CM | POA: Diagnosis not present

## 2022-10-30 DIAGNOSIS — J4540 Moderate persistent asthma, uncomplicated: Secondary | ICD-10-CM | POA: Diagnosis not present

## 2022-10-30 DIAGNOSIS — Z0000 Encounter for general adult medical examination without abnormal findings: Secondary | ICD-10-CM

## 2022-10-30 DIAGNOSIS — E663 Overweight: Secondary | ICD-10-CM | POA: Diagnosis not present

## 2022-10-30 DIAGNOSIS — J3089 Other allergic rhinitis: Secondary | ICD-10-CM | POA: Diagnosis not present

## 2022-10-30 MED ORDER — MONTELUKAST SODIUM 5 MG PO CHEW
CHEWABLE_TABLET | ORAL | 1 refills | Status: DC
  Filled 2022-10-30: qty 180, 90d supply, fill #0
  Filled 2023-02-08: qty 180, 90d supply, fill #1

## 2022-10-30 NOTE — Progress Notes (Signed)
Complete physical exam   Patient: Shelia Ramos   DOB: 1971-12-09   51 y.o. Female  MRN: [id number] Visit Date: 10/30/2022   Chief Complaint  Patient presents with   Annual Exam   Subjective    CAMRIN LAPRE is a 51 y.o. female who presents today for a complete physical exam.  She reports consuming a  vegetarian  diet. The patient does not participate in regular exercise at present. She generally feels fairly well. She does not have additional problems to discuss today.     Past Medical History:  Diagnosis Date   Allergy    SEASONAL   Anxiety    Asthma    AS A CHILD   Depression    IBS (irritable bowel syndrome)    Seasonal allergies    Past Surgical History:  Procedure Laterality Date   BUNIONECTOMY Right    x2   COLPOSCOPY     Social History   Socioeconomic History   Marital status: Married    Spouse name: Not on file   Number of children: 1   Years of education: Not on file   Highest education level: Not on file  Occupational History   Not on file  Tobacco Use   Smoking status: Never   Smokeless tobacco: Never  Vaping Use   Vaping Use: Never used  Substance and Sexual Activity   Alcohol use: No    Alcohol/week: 0.0 standard drinks of alcohol   Drug use: No   Sexual activity: Not Currently    Birth control/protection: I.U.D.  Other Topics Concern   Not on file  Social History Narrative   Not on file   Social Determinants of Health   Financial Resource Strain: Not on file  Food Insecurity: Not on file  Transportation Needs: Not on file  Physical Activity: Not on file  Stress: Not on file  Social Connections: Not on file  Intimate Partner Violence: Not on file     Medications: Outpatient Medications Prior to Visit  Medication Sig   albuterol (PROAIR HFA) 108 (90 Base) MCG/ACT inhaler Inhale 2 puffs into the lungs every 4 (four) hours as needed for wheezing or shortness of breath.   benztropine (COGENTIN) 0.5 MG tablet Take 1  tablet (0.5 mg total) by mouth 2 (two) times daily.   Cetirizine HCl 10 MG CAPS Take 10 mg by mouth daily as needed.   estradiol (ESTRACE) 0.5 MG tablet Take 1 tablet by mouth every day   hydrOXYzine (ATARAX/VISTARIL) 25 MG tablet Take 25 mg by mouth 2 (two) times daily as needed.    hydrOXYzine (VISTARIL) 25 MG capsule Take 1 capsule (25 mg total) by mouth 2 (two) times daily as needed.   Hyoscyamine Sulfate SL 0.125 MG SUBL PLACE 1 TABLET UNDER THE TONGUE AT BEDTIME.   levonorgestrel (MIRENA, 52 MG,) 20 MCG/DAY IUD Mirena 20 mcg/24 hours (8 yrs) 52 mg intrauterine device  Take 1 device by intrauterine route.   mirtazapine (REMERON) 30 MG tablet Take 1/2 - 1 tablet by mouth at bedtime   omeprazole (PRILOSEC) 20 MG capsule TAKE 1 CAPSULE BY MOUTH DAILY   risperiDONE (RISPERDAL) 3 MG tablet Take 1 tablet (3 mg total) by mouth every evening with dinner   [DISCONTINUED] chlorpheniramine-HYDROcodone (TUSSIONEX PENNKINETIC ER) 10-8 MG/5ML Take 5 mLs by mouth at bedtime as needed for cough.   [DISCONTINUED] montelukast (SINGULAIR) 5 MG chewable tablet CHEW 2 TABLETS BY MOUTH EVERY EVENING.   No facility-administered medications prior to  visit.    Review of Systems Review of Systems:  A fourteen system review of systems was performed and found to be positive as per HPI.  Last CBC Lab Results  Component Value Date   WBC 6.0 09/18/2021   HGB 13.3 09/18/2021   HCT 40.6 09/18/2021   MCV 90 09/18/2021   MCH 29.5 09/18/2021   RDW 13.5 09/18/2021   PLT 332 16/09/9603   Last metabolic panel Lab Results  Component Value Date   GLUCOSE 103 (H) 03/19/2022   NA 140 03/19/2022   K 4.1 03/19/2022   CL 104 03/19/2022   CO2 23 03/19/2022   BUN 12 03/19/2022   CREATININE 0.97 03/19/2022   EGFR 70 03/19/2022   CALCIUM 9.7 03/19/2022   PROT 7.0 03/19/2022   ALBUMIN 4.1 03/19/2022   LABGLOB 2.9 03/19/2022   AGRATIO 1.4 03/19/2022   BILITOT 0.2 03/19/2022   ALKPHOS 74 03/19/2022   AST 12  03/19/2022   ALT 9 03/19/2022   ANIONGAP 11 04/30/2017   Last lipids Lab Results  Component Value Date   CHOL 195 03/19/2022   HDL 46 03/19/2022   LDLCALC 135 (H) 03/19/2022   TRIG 77 03/19/2022   CHOLHDL 4.2 03/19/2022   Last hemoglobin A1c Lab Results  Component Value Date   HGBA1C 5.6 09/18/2021   Last thyroid functions Lab Results  Component Value Date   TSH 0.856 09/18/2021   Last vitamin D Lab Results  Component Value Date   VD25OH 30.1 09/18/2021      Objective    BP 111/77 (BP Location: Left Arm, Patient Position: Sitting, Cuff Size: Normal)   Pulse 98   Resp 18   Ht _0  (1.6 m)   Wt 143 lb 6.4 oz (65 kg)   SpO2 97%   BMI 25.40 kg/m  BP Readings from Last 3 Encounters:  10/30/22 111/77  08/24/22 100/68  03/19/22 96/66   Wt Readings from Last 3 Encounters:  10/30/22 143 lb 6.4 oz (65 kg)  08/24/22 143 lb 3.2 oz (65 kg)  03/19/22 146 lb (66.2 kg)      Physical Exam   General Appearance:       Alert, cooperative, in no acute distress, appears stated age   Head:    Normocephalic, without obvious abnormality, atraumatic  Eyes:    PERRL, conjunctiva/corneas clear, EOM's intact, fundi    benign, both eyes  Ears:    Normal TM's and external ear canal left ear; cerumen impaction of right ear  Nose:   Nares normal, septum midline, mucosa normal, no drainage    or sinus tenderness  Throat:   Lips, mucosa, and tongue normal; teeth and gums normal  Neck:   Supple, symmetrical, trachea midline, no adenopathy;    thyroid:  no enlargement/tenderness/nodules; no JVD  Back:     Symmetric, no curvature, ROM normal, no CVA tenderness  Lungs:     Clear to auscultation bilaterally, respirations unlabored  Chest Wall:    No tenderness or deformity   Heart:    Normal heart rate. Normal rhythm. No murmurs, rubs, or gallops.   Breast Exam:    deferred  Abdomen:     Soft, non-tender, bowel sounds active all four quadrants,    no masses, no organomegaly  Pelvic:     deferred  Extremities:   All extremities are intact. No cyanosis or edema  Pulses:   2+ and symmetric all extremities  Skin:   Skin color, texture, turgor normal, no rashes  or suspicious lesions  Lymph nodes:   Cervical, supraclavicular nodes normal  Neurologic:   CNII-XII grossly intact.     Last depression screening scores    10/30/2022    8:39 AM 03/19/2022    9:05 AM 01/28/2022    1:42 PM  PHQ 2/9 Scores  PHQ - 2 Score 0 0 1  PHQ- 9 Score 0 0 3   Last fall risk screening    10/30/2022    8:38 AM  [place] in the past year? 0  Number falls in past yr: 0  Injury with Fall? 0     No results found for any visits on 10/30/22.  Assessment & Plan    Routine Health Maintenance and Physical Exam  Exercise Activities and Dietary recommendations -Discussed heart healthy diet low in fat and carbohydrates. Recommend moderate exercise 150 mins/wk.  Immunization History  Administered Date(s) Administered   Influenza,inj,Quad PF,6+ Mos 10/09/2016, 09/18/2021   Influenza-Unspecified 09/27/2016   PFIZER(Purple Top)SARS-COV-2 Vaccination 07/01/2021, 07/22/2021   Tdap 08/25/2018   Zoster Recombinat (Shingrix) 09/18/2021, 03/19/2022    Health Maintenance  Topic Date Due   PAP SMEAR-Modifier  01/14/2019   COVID-19 Vaccine (3 - Pfizer series) 09/16/2021   INFLUENZA VACCINE  07/28/2022   MAMMOGRAM  10/01/2022   COLONOSCOPY (Pts 45-8yr Insurance coverage will need to be confirmed)  11/01/2024   TETANUS/TDAP  08/25/2028   Hepatitis C Screening  Completed   HIV Screening  Completed   Zoster Vaccines- Shingrix  Completed   HPV VACCINES  Aged Out    Discussed health benefits of physical activity, and encouraged her to engage in regular exercise appropriate for her age and condition.  Problem List Items Addressed This Visit       Respiratory   Moderate persistent asthma-  (Chronic)   Relevant Medications   montelukast (SINGULAIR) 5 MG chewable tablet     Other    Hyperlipidemia   Relevant Orders   Lipid panel   Amb ref to Medical Nutrition Therapy-MNT   Vitamin D deficiency   Relevant Orders   Vitamin D (25 hydroxy)   Environmental and seasonal allergies   Relevant Medications   montelukast (SINGULAIR) 5 MG chewable tablet   Other Visit Diagnoses     Healthcare maintenance    -  Primary   Relevant Orders   Hemoglobin A1c   Lipid panel   TSH   CBC with Differential/Platelet   Comprehensive metabolic panel   Vitamin D (25 hydroxy)   Overweight with body mass index (BMI) of 25 to 25.9 in adult       Relevant Orders   Amb ref to Medical Nutrition Therapy-MNT   Impacted cerumen of right ear          Will obtain routine fasting labs. Patient wants to work on weight loss with diet and lifestyle interventions, agreeable to referral to nutritionist.  Patient obtained flu vaccine through work, advised to forward record/date. [facility] will [facility] most recent [facility]ogram. UTD colonoscopy, Tdap. Has completed Shingrix. Advised to use debrox ear drops for right ear cerumen impaction, follow-up for ear irrigation if needed. Follow-up in 6 months for reg OV- asthma, allergies, mood, wt.   Return in about 6 months (around 04/30/2023) for asthma, allergies, mood.       MLorrene Reid PA-C  [phone](phone) [phone](fax)  [place]

## 2022-10-30 NOTE — Patient Instructions (Signed)
Preventive Care 40-53 Years Old, Female Preventive care refers to lifestyle choices and visits with your health care provider that can promote health and wellness. Preventive care visits are also called wellness exams. What can I expect for my preventive care visit? Counseling Your health care provider may ask you questions about your: Medical history, including: Past medical problems. Family medical history. Pregnancy history. Current health, including: Menstrual cycle. Method of birth control. Emotional well-being. Home life and relationship well-being. Sexual activity and sexual health. Lifestyle, including: Alcohol, nicotine or tobacco, and drug use. Access to firearms. Diet, exercise, and sleep habits. Work and work environment. Sunscreen use. Safety issues such as seatbelt and bike helmet use. Physical exam Your health care provider will check your: Height and weight. These may be used to calculate your BMI (body mass index). BMI is a measurement that tells if you are at a healthy weight. Waist circumference. This measures the distance around your waistline. This measurement also tells if you are at a healthy weight and may help predict your risk of certain diseases, such as type 2 diabetes and high blood pressure. Heart rate and blood pressure. Body temperature. Skin for abnormal spots. What immunizations do I need?  Vaccines are usually given at various ages, according to a schedule. Your health care provider will recommend vaccines for you based on your age, medical history, and lifestyle or other factors, such as travel or where you work. What tests do I need? Screening Your health care provider may recommend screening tests for certain conditions. This may include: Lipid and cholesterol levels. Diabetes screening. This is done by checking your blood sugar (glucose) after you have not eaten for a while (fasting). Pelvic exam and Pap test. Hepatitis B test. Hepatitis C  test. HIV (human immunodeficiency virus) test. STI (sexually transmitted infection) testing, if you are at risk. Lung cancer screening. Colorectal cancer screening. Mammogram. Talk with your health care provider about when you should start having regular mammograms. This may depend on whether you have a family history of breast cancer. BRCA-related cancer screening. This may be done if you have a family history of breast, ovarian, tubal, or peritoneal cancers. Bone density scan. This is done to screen for osteoporosis. Talk with your health care provider about your test results, treatment options, and if necessary, the need for more tests. Follow these instructions at home: Eating and drinking  Eat a diet that includes fresh fruits and vegetables, whole grains, lean protein, and low-fat dairy products. Take vitamin and mineral supplements as recommended by your health care provider. Do not drink alcohol if: Your health care provider tells you not to drink. You are pregnant, may be pregnant, or are planning to become pregnant. If you drink alcohol: Limit how much you have to 0-1 drink a day. Know how much alcohol is in your drink. In the [place] one drink equals one 12 oz bottle of beer (355 mL), one 5 oz glass of wine (148 mL), or one 1 oz glass of hard liquor (44 mL). Lifestyle Brush your teeth every morning and night with fluoride toothpaste. Floss one time each day. Exercise for at least 30 minutes 5 or more days each week. Do not use any products that contain nicotine or tobacco. These products include cigarettes, chewing tobacco, and vaping devices, such as e-cigarettes. If you need help quitting, ask your health care provider. Do not use drugs. If you are sexually active, practice safe sex. Use a condom or other form of protection to   prevent STIs. If you do not wish to become pregnant, use a form of birth control. If you plan to become pregnant, see your health care provider for a  prepregnancy visit. Take aspirin only as told by your health care provider. Make sure that you understand how much to take and what form to take. Work with your health care provider to find out whether it is safe and beneficial for you to take aspirin daily. Find healthy ways to manage stress, such as: Meditation, yoga, or listening to music. Journaling. Talking to a trusted person. Spending time with friends and family. Minimize exposure to UV radiation to reduce your risk of skin cancer. Safety Always wear your seat belt while driving or riding in a vehicle. Do not drive: If you have been drinking alcohol. Do not ride with someone who has been drinking. When you are tired or distracted. While texting. If you have been using any mind-altering substances or drugs. Wear a helmet and other protective equipment during sports activities. If you have firearms in your house, make sure you follow all gun safety procedures. Seek help if you have been physically or sexually abused. What's next? Visit your health care provider once a year for an annual wellness visit. Ask your health care provider how often you should have your eyes and teeth checked. Stay up to date on all vaccines. This information is not intended to replace advice given to you by your health care provider. Make sure you discuss any questions you have with your health care provider. Document Revised: 06/11/2021 Document Reviewed: 06/11/2021 Elsevier Patient Education  [place].

## 2022-10-31 LAB — COMPREHENSIVE METABOLIC PANEL
ALT: 9 IU/L (ref 0–32)
AST: 11 IU/L (ref 0–40)
Albumin/Globulin Ratio: 1.3 (ref 1.2–2.2)
Albumin: 3.8 g/dL (ref 3.8–4.9)
Alkaline Phosphatase: 66 IU/L (ref 44–121)
BUN/Creatinine Ratio: 9 (ref 9–23)
BUN: 11 mg/dL (ref 6–24)
Bilirubin Total: 0.2 mg/dL (ref 0.0–1.2)
CO2: 24 mmol/L (ref 20–29)
Calcium: 8.9 mg/dL (ref 8.7–10.2)
Chloride: 105 mmol/L (ref 96–106)
Creatinine, Ser: 1.16 mg/dL — ABNORMAL HIGH (ref 0.57–1.00)
Globulin, Total: 3 g/dL (ref 1.5–4.5)
Glucose: 94 mg/dL (ref 70–99)
Potassium: 4.2 mmol/L (ref 3.5–5.2)
Sodium: 139 mmol/L (ref 134–144)
Total Protein: 6.8 g/dL (ref 6.0–8.5)
eGFR: 56 mL/min/{1.73_m2} — ABNORMAL LOW (ref >59)

## 2022-10-31 LAB — CBC WITH DIFFERENTIAL/PLATELET
Basophils Absolute: 0 10*3/uL (ref 0.0–0.2)
Basos: 0 %
EOS (ABSOLUTE): 0.3 10*3/uL (ref 0.0–0.4)
Eos: 4 %
Hematocrit: 36.5 % (ref 34.0–46.6)
Hemoglobin: 12.2 g/dL (ref 11.1–15.9)
Immature Grans (Abs): 0 10*3/uL (ref 0.0–0.1)
Immature Granulocytes: 0 %
Lymphocytes Absolute: 1.9 10*3/uL (ref 0.7–3.1)
Lymphs: 28 %
MCH: 28.8 pg (ref 26.6–33.0)
MCHC: 33.4 g/dL (ref 31.5–35.7)
MCV: 86 fL (ref 79–97)
Monocytes Absolute: 0.4 10*3/uL (ref 0.1–0.9)
Monocytes: 5 %
Neutrophils Absolute: 4.4 10*3/uL (ref 1.4–7.0)
Neutrophils: 63 %
Platelets: 350 10*3/uL (ref 150–450)
RBC: 4.24 x10E6/uL (ref 3.77–5.28)
RDW: 13.2 % (ref 11.7–15.4)
WBC: 6.9 10*3/uL (ref 3.4–10.8)

## 2022-10-31 LAB — HEMOGLOBIN A1C
Est. average glucose Bld gHb Est-mCnc: 114 mg/dL
Hgb A1c MFr Bld: 5.6 % (ref 4.8–5.6)

## 2022-10-31 LAB — LIPID PANEL
Chol/HDL Ratio: 4.3 ratio (ref 0.0–4.4)
Cholesterol, Total: 158 mg/dL (ref 100–199)
HDL: 37 mg/dL — ABNORMAL LOW (ref >39)
LDL Chol Calc (NIH): 98 mg/dL (ref 0–99)
Triglycerides: 129 mg/dL (ref 0–149)
VLDL Cholesterol Cal: 23 mg/dL (ref 5–40)

## 2022-10-31 LAB — VITAMIN D 25 HYDROXY (VIT D DEFICIENCY, FRACTURES): Vit D, 25-Hydroxy: 24.5 ng/mL — ABNORMAL LOW (ref 30.0–100.0)

## 2022-10-31 LAB — TSH: TSH: 1.14 u[IU]/mL (ref 0.450–4.500)

## 2022-11-02 ENCOUNTER — Other Ambulatory Visit (HOSPITAL_COMMUNITY): Payer: Self-pay

## 2022-11-03 ENCOUNTER — Encounter: Payer: Self-pay | Admitting: Physician Assistant

## 2022-11-04 ENCOUNTER — Other Ambulatory Visit (HOSPITAL_COMMUNITY): Payer: Self-pay

## 2022-11-04 MED ORDER — MIRTAZAPINE 30 MG PO TABS
15.0000 mg | ORAL_TABLET | Freq: Every day | ORAL | 1 refills | Status: DC
  Filled 2022-11-04: qty 90, 90d supply, fill #0
  Filled 2023-02-08: qty 90, 90d supply, fill #1

## 2022-11-05 ENCOUNTER — Other Ambulatory Visit (HOSPITAL_COMMUNITY): Payer: Self-pay

## 2022-12-12 ENCOUNTER — Other Ambulatory Visit (HOSPITAL_COMMUNITY): Payer: Self-pay

## 2022-12-18 ENCOUNTER — Encounter: Payer: 59 | Admitting: Dietician

## 2022-12-29 ENCOUNTER — Other Ambulatory Visit (HOSPITAL_COMMUNITY): Payer: Self-pay

## 2023-01-11 ENCOUNTER — Other Ambulatory Visit (HOSPITAL_COMMUNITY): Payer: Self-pay

## 2023-02-08 ENCOUNTER — Other Ambulatory Visit (HOSPITAL_COMMUNITY): Payer: Self-pay

## 2023-02-18 DIAGNOSIS — F411 Generalized anxiety disorder: Secondary | ICD-10-CM | POA: Diagnosis not present

## 2023-02-18 DIAGNOSIS — G4700 Insomnia, unspecified: Secondary | ICD-10-CM | POA: Diagnosis not present

## 2023-02-18 DIAGNOSIS — F319 Bipolar disorder, unspecified: Secondary | ICD-10-CM | POA: Diagnosis not present

## 2023-03-20 ENCOUNTER — Other Ambulatory Visit (HOSPITAL_COMMUNITY): Payer: Self-pay

## 2023-03-29 ENCOUNTER — Other Ambulatory Visit (HOSPITAL_COMMUNITY): Payer: Self-pay

## 2023-03-31 ENCOUNTER — Other Ambulatory Visit (HOSPITAL_COMMUNITY): Payer: Self-pay

## 2023-03-31 MED ORDER — RISPERIDONE 3 MG PO TABS
3.0000 mg | ORAL_TABLET | Freq: Every evening | ORAL | 2 refills | Status: DC
  Filled 2023-03-31: qty 90, 90d supply, fill #0
  Filled 2023-07-03: qty 30, 30d supply, fill #1
  Filled 2023-07-31: qty 30, 30d supply, fill #2
  Filled 2023-09-02: qty 30, 30d supply, fill #3
  Filled 2023-10-04 (×2): qty 30, 30d supply, fill #4
  Filled 2023-11-09: qty 30, 30d supply, fill #5
  Filled 2024-03-16: qty 30, 30d supply, fill #6

## 2023-04-23 ENCOUNTER — Other Ambulatory Visit (HOSPITAL_COMMUNITY): Payer: Self-pay

## 2023-04-30 ENCOUNTER — Other Ambulatory Visit (HOSPITAL_COMMUNITY): Payer: Self-pay

## 2023-04-30 ENCOUNTER — Ambulatory Visit: Payer: Commercial Managed Care - PPO | Admitting: Family Medicine

## 2023-04-30 ENCOUNTER — Encounter: Payer: Self-pay | Admitting: Family Medicine

## 2023-04-30 VITALS — BP 113/74 | HR 100 | Ht 63.0 in | Wt 148.1 lb

## 2023-04-30 DIAGNOSIS — J3089 Other allergic rhinitis: Secondary | ICD-10-CM

## 2023-04-30 DIAGNOSIS — H6121 Impacted cerumen, right ear: Secondary | ICD-10-CM | POA: Diagnosis not present

## 2023-04-30 DIAGNOSIS — F5101 Primary insomnia: Secondary | ICD-10-CM | POA: Diagnosis not present

## 2023-04-30 DIAGNOSIS — J4540 Moderate persistent asthma, uncomplicated: Secondary | ICD-10-CM

## 2023-04-30 DIAGNOSIS — H1010 Acute atopic conjunctivitis, unspecified eye: Secondary | ICD-10-CM

## 2023-04-30 DIAGNOSIS — J309 Allergic rhinitis, unspecified: Secondary | ICD-10-CM

## 2023-04-30 DIAGNOSIS — F315 Bipolar disorder, current episode depressed, severe, with psychotic features: Secondary | ICD-10-CM | POA: Diagnosis not present

## 2023-04-30 MED ORDER — CARBAMIDE PEROXIDE 6.5 % OT SOLN
5.0000 [drp] | Freq: Two times a day (BID) | OTIC | 0 refills | Status: DC
Start: 2023-04-30 — End: 2024-09-05
  Filled 2023-04-30: qty 15, 30d supply, fill #0

## 2023-04-30 MED ORDER — ALBUTEROL SULFATE HFA 108 (90 BASE) MCG/ACT IN AERS
2.0000 | INHALATION_SPRAY | RESPIRATORY_TRACT | 1 refills | Status: DC | PRN
Start: 2023-04-30 — End: 2023-11-05
  Filled 2023-04-30: qty 6.7, 15d supply, fill #0

## 2023-04-30 MED ORDER — MONTELUKAST SODIUM 5 MG PO CHEW
CHEWABLE_TABLET | ORAL | 1 refills | Status: DC
Start: 2023-04-30 — End: 2023-11-05
  Filled 2023-04-30: qty 180, 90d supply, fill #0
  Filled 2023-08-03: qty 180, 90d supply, fill #1

## 2023-04-30 NOTE — Assessment & Plan Note (Signed)
Stable, lungs CTA bilaterally.  Continue montelukast 5 mg daily and rescue albuterol inhaler as needed.  Will continue to monitor.

## 2023-04-30 NOTE — Assessment & Plan Note (Signed)
Continue montelukast 5 mg daily.  I recommended that she try Claritin during allergy season for better control of allergic rhinosinusitis and rhinoconjunctivitis.  Patient is agreeable to trial of Claritin.  She may also try Zyrtec or Allegra if Claritin is ineffective.  If these medications are not effective, then may consider adding fluticasone spray.  Patient verbalized understanding and is agreeable with this plan.

## 2023-04-30 NOTE — Patient Instructions (Signed)
Try taking Claritin daily for your allergies.  If that does not work super well, you can also try switching to Zyrtec or Allegra since sometimes one works better than the others for some people.  I have sent in a prescription for eardrops to help with breaking up the earwax.  If you do want to look for it over-the-counter, the brand name is Debrox.  You can ask the pharmacist for help finding this if it is difficult to locate.  It was nice to meet you today, I will see you for your annual physical in about 6 months!

## 2023-04-30 NOTE — Assessment & Plan Note (Signed)
Followed by psychiatry.  PHQ-9 score of 2, GAD-7 score of 0.  Continue risperidone 3 mg daily with Cogentin 0.5 mg twice daily for risperidone side effects.  Continue hydroxyzine 25 mg up to twice daily as needed.  Will continue to monitor.

## 2023-04-30 NOTE — Progress Notes (Signed)
Established Patient Office Visit  Subjective   Patient ID: Shelia Ramos, female    DOB: 13-Sep-1971  Age: 52 y.o. MRN: [id number]  Chief Complaint  Patient presents with   Allergies   Asthma    HPI Shelia Ramos is a 52 y.o. female presenting today for follow up of allergies, asthma, mood. Allergies: Patient has been struggling with allergies especially nasal congestion.  She is also had a dry cough for several months.  She is not taking any allergy medication right now, though she does take montelukast 5 mg daily. Asthma: Well-controlled.  Taking montelukast 5 mg daily, she does have an albuterol rescue inhaler but she only uses it about once a month. Mood: Patient is here to follow up for bipolar disorder and insomnia, currently managing with risperidone 3 mg daily, hydroxyzine 25 mg up to twice daily as needed, mirtazapine 30 mg nightly at bedtime.  She also takes Cogentin 0.5 mg twice daily for risperidone side effects.. Taking medication without side effects, reports excellent compliance with treatment. Denies mood changes or SI/HI. She feels mood is stable since last visit.  She sees behavioral health for counseling and psychiatric medication management. Denies chest pain, difficulty concentrating, dizziness, fatigue, insomnia, irritability, palpitations, panic attacks, racing thoughts, SOB, sweating. Denies anhedonia, depressed mood, difficulty concentrating, fatigue, feelings of worthlessness/guilt, hopelessness, hypersomnia, impaired memory, insomnia, psychomotor agitation, psychomotor retardation, recurrent thoughts of death, weight changes.     05/25/23    8:36 AM 10/30/2022    8:39 AM 03/19/2022    9:05 AM  Depression screen PHQ 2/9  Decreased Interest 0 0 0  Down, Depressed, Hopeless 0 0 0  PHQ - 2 Score 0 0 0  Altered sleeping 1 0 0  Tired, decreased energy 1 0 0  Change in appetite 0 0 0  Feeling bad or failure about yourself  0 0 0  Trouble concentrating 0 0  0  Moving slowly or fidgety/restless 0 0 0  Suicidal thoughts 0 0 0  PHQ-9 Score 2 0 0  Difficult doing work/chores Not difficult at all Not difficult at all Not difficult at all       10/30/2022    8:39 AM 03/19/2022    9:05 AM 01/28/2022    1:44 PM 09/18/2021    8:44 AM  GAD 7 : Generalized Anxiety Score  Nervous, Anxious, on Edge 0 0 0 0  Control/stop worrying 0 0 0 0  Worry too much - different things 0 0 0 0  Trouble relaxing 0 0 0 0  Restless 0 0 0 0  Easily annoyed or irritable 0 0 0 0  Afraid - awful might happen 0 0 0 0  Total GAD 7 Score 0 0 0 0  Anxiety Difficulty Not difficult at all Not difficult at all     ROS Negative unless otherwise noted in HPI   Objective:     BP 113/74   Pulse 100   Ht 5\' 3"  (1.6 m)   Wt 148 lb 1.9 oz (67.2 kg)   SpO2 98%   BMI 26.24 kg/m   Physical Exam Constitutional:      General: She is not in acute distress.    Appearance: Normal appearance. She is not ill-appearing.  HENT:     Head: Normocephalic and atraumatic.     Right Ear: Tympanic membrane, ear canal and external ear normal. There is impacted cerumen.     Left Ear: Tympanic membrane, ear canal and external ear  normal.     Nose: Nose normal. No congestion or rhinorrhea.     Mouth/Throat:     Mouth: Mucous membranes are moist.     Pharynx: Oropharynx is clear. No oropharyngeal exudate or posterior oropharyngeal erythema.  Eyes:     General:        Right eye: No discharge.        Left eye: No discharge.     Extraocular Movements: Extraocular movements intact.     Conjunctiva/sclera: Conjunctivae normal.     Pupils: Pupils are equal, round, and reactive to light.  Cardiovascular:     Rate and Rhythm: Normal rate and regular rhythm.     Pulses: Normal pulses.     Heart sounds: No murmur heard.    No friction rub. No gallop.  Pulmonary:     Effort: Pulmonary effort is normal. No respiratory distress.     Breath sounds: No wheezing, rhonchi or rales.  Musculoskeletal:      Cervical back: Normal range of motion.  Skin:    General: Skin is warm and dry.  Neurological:     Mental Status: She is alert and oriented to person, place, and time.     Cranial Nerves: No cranial nerve deficit.  Psychiatric:        Mood and Affect: Mood normal.      Assessment & Plan:  Allergic rhinoconjunctivitis Assessment & Plan: Continue montelukast 5 mg daily.  I recommended that she try Claritin during allergy season for better control of allergic rhinosinusitis and rhinoconjunctivitis.  Patient is agreeable to trial of Claritin.  She may also try Zyrtec or Allegra if Claritin is ineffective.  If these medications are not effective, then may consider adding fluticasone spray.  Patient verbalized understanding and is agreeable with this plan.  Orders: -     CBC with Differential/Platelet; Future -     Comprehensive metabolic panel; Future -     Montelukast Sodium; CHEW 2 TABLETS BY MOUTH EVERY EVENING.  Dispense: 180 tablet; Refill: 1  Moderate persistent asthma-  Assessment & Plan: Stable, lungs CTA bilaterally.  Continue montelukast 5 mg daily and rescue albuterol inhaler as needed.  Will continue to monitor.  Orders: -     CBC with Differential/Platelet; Future -     Comprehensive metabolic panel; Future -     Montelukast Sodium; CHEW 2 TABLETS BY MOUTH EVERY EVENING.  Dispense: 180 tablet; Refill: 1 -     Albuterol Sulfate HFA; Inhale 2 puffs into the lungs every 4 (four) hours as needed for wheezing or shortness of breath.  Dispense: 18 g; Refill: 1  Bipolar disorder, curr episode depressed, severe, w/psychotic features [facility]) Assessment & Plan: Followed by psychiatry.  PHQ-9 score of 2, GAD-7 score of 0.  Continue risperidone 3 mg daily with Cogentin 0.5 mg twice daily for risperidone side effects.  Continue hydroxyzine 25 mg up to twice daily as needed.  Will continue to monitor.  Orders: -     CBC with Differential/Platelet; Future -     Comprehensive  metabolic panel; Future  Primary insomnia Assessment & Plan: Still struggles with insomnia occasionally, but generally well-controlled with current medication.  Continue mirtazapine 30 mg daily at bedtime.   Moderate persistent asthma, uncomplicated-    Assessment & Plan: Stable, lungs CTA bilaterally.  Continue montelukast 5 mg daily and rescue albuterol inhaler as needed.  Will continue to monitor.  Orders: -     CBC with Differential/Platelet; Future -  Comprehensive metabolic panel; Future -     Montelukast Sodium; CHEW 2 TABLETS BY MOUTH EVERY EVENING.  Dispense: 180 tablet; Refill: 1 -     Albuterol Sulfate HFA; Inhale 2 puffs into the lungs every 4 (four) hours as needed for wheezing or shortness of breath.  Dispense: 18 g; Refill: 1  Environmental and seasonal allergies-  -     Montelukast Sodium; CHEW 2 TABLETS BY MOUTH EVERY EVENING.  Dispense: 180 tablet; Refill: 1  Impacted cerumen of right ear -     Carbamide Peroxide; Place 5 drops into the right ear 2 (two) times daily.  Dispense: 15 mL; Refill: 0  Recommended Debrox drops for excessive cerumen in right ear.  Provided prescription and recommended that she ask the pharmacist where to find the over-the-counter drops if necessary in the future.  Return in about 6 months (around 10/31/2023) for annual physical, fasting blood work 1 week before.    Melida Quitter, PA

## 2023-04-30 NOTE — Assessment & Plan Note (Signed)
Still struggles with insomnia occasionally, but generally well-controlled with current medication.  Continue mirtazapine 30 mg daily at bedtime.

## 2023-05-01 LAB — COMPREHENSIVE METABOLIC PANEL
ALT: 9 IU/L (ref 0–32)
AST: 12 IU/L (ref 0–40)
Albumin/Globulin Ratio: 1.4 (ref 1.2–2.2)
Albumin: 4 g/dL (ref 3.8–4.9)
Alkaline Phosphatase: 71 IU/L (ref 44–121)
BUN/Creatinine Ratio: 13 (ref 9–23)
BUN: 14 mg/dL (ref 6–24)
Bilirubin Total: 0.2 mg/dL (ref 0.0–1.2)
CO2: 21 mmol/L (ref 20–29)
Calcium: 9.6 mg/dL (ref 8.7–10.2)
Chloride: 103 mmol/L (ref 96–106)
Creatinine, Ser: 1.08 mg/dL — ABNORMAL HIGH (ref 0.57–1.00)
Globulin, Total: 2.9 g/dL (ref 1.5–4.5)
Glucose: 102 mg/dL — ABNORMAL HIGH (ref 70–99)
Potassium: 4.4 mmol/L (ref 3.5–5.2)
Sodium: 139 mmol/L (ref 134–144)
Total Protein: 6.9 g/dL (ref 6.0–8.5)
eGFR: 61 mL/min/{1.73_m2} (ref >59)

## 2023-05-01 LAB — CBC WITH DIFFERENTIAL/PLATELET
Basophils Absolute: 0 10*3/uL (ref 0.0–0.2)
Basos: 1 %
EOS (ABSOLUTE): 0.4 10*3/uL (ref 0.0–0.4)
Eos: 6 %
Hematocrit: 39.3 % (ref 34.0–46.6)
Hemoglobin: 12.2 g/dL (ref 11.1–15.9)
Immature Grans (Abs): 0 10*3/uL (ref 0.0–0.1)
Immature Granulocytes: 0 %
Lymphocytes Absolute: 1.8 10*3/uL (ref 0.7–3.1)
Lymphs: 26 %
MCH: 27.4 pg (ref 26.6–33.0)
MCHC: 31 g/dL — ABNORMAL LOW (ref 31.5–35.7)
MCV: 88 fL (ref 79–97)
Monocytes Absolute: 0.4 10*3/uL (ref 0.1–0.9)
Monocytes: 6 %
Neutrophils Absolute: 4.2 10*3/uL (ref 1.4–7.0)
Neutrophils: 61 %
Platelets: 346 10*3/uL (ref 150–450)
RBC: 4.46 x10E6/uL (ref 3.77–5.28)
RDW: 14.6 % (ref 11.7–15.4)
WBC: 6.9 10*3/uL (ref 3.4–10.8)

## 2023-05-18 ENCOUNTER — Other Ambulatory Visit (HOSPITAL_COMMUNITY): Payer: Self-pay

## 2023-05-19 ENCOUNTER — Other Ambulatory Visit (HOSPITAL_COMMUNITY): Payer: Self-pay

## 2023-05-19 MED ORDER — MIRTAZAPINE 30 MG PO TABS
15.0000 mg | ORAL_TABLET | Freq: Every day | ORAL | 2 refills | Status: DC
  Filled 2023-05-19: qty 90, 90d supply, fill #0
  Filled 2023-08-19: qty 90, 90d supply, fill #1
  Filled 2023-11-24: qty 90, 90d supply, fill #2

## 2023-05-22 ENCOUNTER — Other Ambulatory Visit (HOSPITAL_COMMUNITY): Payer: Self-pay

## 2023-06-03 ENCOUNTER — Other Ambulatory Visit (HOSPITAL_COMMUNITY): Payer: Self-pay

## 2023-06-08 ENCOUNTER — Other Ambulatory Visit (HOSPITAL_COMMUNITY): Payer: Self-pay

## 2023-06-08 MED ORDER — HYOSCYAMINE SULFATE 0.125 MG SL SUBL
0.1250 mg | SUBLINGUAL_TABLET | Freq: Every evening | SUBLINGUAL | 3 refills | Status: AC
  Filled 2023-06-08: qty 30, 30d supply, fill #0
  Filled 2023-07-10: qty 30, 30d supply, fill #1
  Filled 2023-08-03 (×2): qty 30, 30d supply, fill #2

## 2023-06-23 DIAGNOSIS — G4700 Insomnia, unspecified: Secondary | ICD-10-CM | POA: Diagnosis not present

## 2023-06-23 DIAGNOSIS — F319 Bipolar disorder, unspecified: Secondary | ICD-10-CM | POA: Diagnosis not present

## 2023-06-23 DIAGNOSIS — F411 Generalized anxiety disorder: Secondary | ICD-10-CM | POA: Diagnosis not present

## 2023-07-03 ENCOUNTER — Other Ambulatory Visit (HOSPITAL_COMMUNITY): Payer: Self-pay

## 2023-07-10 ENCOUNTER — Other Ambulatory Visit (HOSPITAL_COMMUNITY): Payer: Self-pay

## 2023-07-20 ENCOUNTER — Other Ambulatory Visit (HOSPITAL_COMMUNITY): Payer: Self-pay

## 2023-07-31 ENCOUNTER — Other Ambulatory Visit (HOSPITAL_COMMUNITY): Payer: Self-pay

## 2023-08-02 ENCOUNTER — Other Ambulatory Visit (HOSPITAL_COMMUNITY): Payer: Self-pay

## 2023-08-03 ENCOUNTER — Other Ambulatory Visit (HOSPITAL_COMMUNITY): Payer: Self-pay

## 2023-08-19 ENCOUNTER — Other Ambulatory Visit (HOSPITAL_COMMUNITY): Payer: Self-pay

## 2023-09-02 ENCOUNTER — Other Ambulatory Visit (HOSPITAL_COMMUNITY): Payer: Self-pay

## 2023-09-03 ENCOUNTER — Other Ambulatory Visit (HOSPITAL_COMMUNITY): Payer: Self-pay

## 2023-10-04 ENCOUNTER — Other Ambulatory Visit (HOSPITAL_COMMUNITY): Payer: Self-pay

## 2023-10-06 ENCOUNTER — Other Ambulatory Visit (HOSPITAL_COMMUNITY): Payer: Self-pay

## 2023-10-08 ENCOUNTER — Other Ambulatory Visit (HOSPITAL_COMMUNITY): Payer: Self-pay

## 2023-10-08 DIAGNOSIS — N3941 Urge incontinence: Secondary | ICD-10-CM | POA: Diagnosis not present

## 2023-10-08 DIAGNOSIS — Z01419 Encounter for gynecological examination (general) (routine) without abnormal findings: Secondary | ICD-10-CM | POA: Diagnosis not present

## 2023-10-08 DIAGNOSIS — Z1231 Encounter for screening mammogram for malignant neoplasm of breast: Secondary | ICD-10-CM | POA: Diagnosis not present

## 2023-10-08 MED ORDER — ESTRADIOL 0.1 MG/24HR TD PTTW
1.0000 | MEDICATED_PATCH | TRANSDERMAL | 4 refills | Status: AC
  Filled 2023-10-08: qty 24, 84d supply, fill #0

## 2023-10-20 ENCOUNTER — Other Ambulatory Visit (HOSPITAL_COMMUNITY): Payer: Self-pay

## 2023-10-20 DIAGNOSIS — F319 Bipolar disorder, unspecified: Secondary | ICD-10-CM | POA: Diagnosis not present

## 2023-10-20 DIAGNOSIS — G4700 Insomnia, unspecified: Secondary | ICD-10-CM | POA: Diagnosis not present

## 2023-10-20 DIAGNOSIS — F411 Generalized anxiety disorder: Secondary | ICD-10-CM | POA: Diagnosis not present

## 2023-10-21 ENCOUNTER — Other Ambulatory Visit (HOSPITAL_COMMUNITY): Payer: Self-pay

## 2023-10-21 MED ORDER — RISPERIDONE 3 MG PO TABS
3.0000 mg | ORAL_TABLET | Freq: Every day | ORAL | 0 refills | Status: DC
  Filled 2023-10-21 – 2023-12-11 (×2): qty 90, 90d supply, fill #0

## 2023-10-29 ENCOUNTER — Other Ambulatory Visit: Payer: Self-pay

## 2023-10-29 ENCOUNTER — Other Ambulatory Visit: Payer: Commercial Managed Care - PPO

## 2023-10-29 DIAGNOSIS — H1010 Acute atopic conjunctivitis, unspecified eye: Secondary | ICD-10-CM

## 2023-10-29 DIAGNOSIS — J4540 Moderate persistent asthma, uncomplicated: Secondary | ICD-10-CM

## 2023-10-29 DIAGNOSIS — J309 Allergic rhinitis, unspecified: Secondary | ICD-10-CM | POA: Diagnosis not present

## 2023-10-29 DIAGNOSIS — F315 Bipolar disorder, current episode depressed, severe, with psychotic features: Secondary | ICD-10-CM | POA: Diagnosis not present

## 2023-10-30 LAB — CBC WITH DIFFERENTIAL/PLATELET
Basophils Absolute: 0 10*3/uL (ref 0.0–0.2)
Basos: 0 %
EOS (ABSOLUTE): 0.7 10*3/uL — ABNORMAL HIGH (ref 0.0–0.4)
Eos: 8 %
Hematocrit: 36.3 % (ref 34.0–46.6)
Hemoglobin: 11.3 g/dL (ref 11.1–15.9)
Immature Grans (Abs): 0 10*3/uL (ref 0.0–0.1)
Immature Granulocytes: 0 %
Lymphocytes Absolute: 2.4 10*3/uL (ref 0.7–3.1)
Lymphs: 31 %
MCH: 27.1 pg (ref 26.6–33.0)
MCHC: 31.1 g/dL — ABNORMAL LOW (ref 31.5–35.7)
MCV: 87 fL (ref 79–97)
Monocytes Absolute: 0.6 10*3/uL (ref 0.1–0.9)
Monocytes: 7 %
Neutrophils Absolute: 4.1 10*3/uL (ref 1.4–7.0)
Neutrophils: 54 %
Platelets: 383 10*3/uL (ref 150–450)
RBC: 4.17 x10E6/uL (ref 3.77–5.28)
RDW: 15 % (ref 11.7–15.4)
WBC: 7.8 10*3/uL (ref 3.4–10.8)

## 2023-10-30 LAB — COMPREHENSIVE METABOLIC PANEL
ALT: 11 [IU]/L (ref 0–32)
AST: 13 [IU]/L (ref 0–40)
Albumin: 3.8 g/dL (ref 3.8–4.9)
Alkaline Phosphatase: 72 [IU]/L (ref 44–121)
BUN/Creatinine Ratio: 9 (ref 9–23)
BUN: 10 mg/dL (ref 6–24)
Bilirubin Total: 0.3 mg/dL (ref 0.0–1.2)
CO2: 22 mmol/L (ref 20–29)
Calcium: 8.5 mg/dL — ABNORMAL LOW (ref 8.7–10.2)
Chloride: 105 mmol/L (ref 96–106)
Creatinine, Ser: 1.11 mg/dL — ABNORMAL HIGH (ref 0.57–1.00)
Globulin, Total: 3.1 g/dL (ref 1.5–4.5)
Glucose: 101 mg/dL — ABNORMAL HIGH (ref 70–99)
Potassium: 4.5 mmol/L (ref 3.5–5.2)
Sodium: 140 mmol/L (ref 134–144)
Total Protein: 6.9 g/dL (ref 6.0–8.5)
eGFR: 59 mL/min/{1.73_m2} — ABNORMAL LOW (ref >59)

## 2023-11-05 ENCOUNTER — Other Ambulatory Visit (HOSPITAL_COMMUNITY): Payer: Self-pay

## 2023-11-05 ENCOUNTER — Encounter: Payer: Self-pay | Admitting: Family Medicine

## 2023-11-05 ENCOUNTER — Ambulatory Visit (INDEPENDENT_AMBULATORY_CARE_PROVIDER_SITE_OTHER): Payer: Commercial Managed Care - PPO | Admitting: Family Medicine

## 2023-11-05 ENCOUNTER — Other Ambulatory Visit: Payer: Self-pay

## 2023-11-05 VITALS — BP 89/61 | HR 92 | Resp 18 | Ht 63.0 in | Wt 150.0 lb

## 2023-11-05 DIAGNOSIS — H1010 Acute atopic conjunctivitis, unspecified eye: Secondary | ICD-10-CM

## 2023-11-05 DIAGNOSIS — E559 Vitamin D deficiency, unspecified: Secondary | ICD-10-CM

## 2023-11-05 DIAGNOSIS — F315 Bipolar disorder, current episode depressed, severe, with psychotic features: Secondary | ICD-10-CM

## 2023-11-05 DIAGNOSIS — Z0000 Encounter for general adult medical examination without abnormal findings: Secondary | ICD-10-CM | POA: Diagnosis not present

## 2023-11-05 DIAGNOSIS — J4540 Moderate persistent asthma, uncomplicated: Secondary | ICD-10-CM | POA: Diagnosis not present

## 2023-11-05 DIAGNOSIS — J309 Allergic rhinitis, unspecified: Secondary | ICD-10-CM

## 2023-11-05 DIAGNOSIS — J3089 Other allergic rhinitis: Secondary | ICD-10-CM | POA: Diagnosis not present

## 2023-11-05 DIAGNOSIS — E782 Mixed hyperlipidemia: Secondary | ICD-10-CM | POA: Diagnosis not present

## 2023-11-05 MED ORDER — ALBUTEROL SULFATE HFA 108 (90 BASE) MCG/ACT IN AERS
2.0000 | INHALATION_SPRAY | RESPIRATORY_TRACT | 1 refills | Status: DC | PRN
  Filled 2023-11-05: qty 6.7, 15d supply, fill #0

## 2023-11-05 MED ORDER — MONTELUKAST SODIUM 5 MG PO CHEW
10.0000 mg | CHEWABLE_TABLET | Freq: Every evening | ORAL | 1 refills | Status: DC
  Filled 2023-11-05: qty 180, 90d supply, fill #0
  Filled 2024-02-11: qty 180, 90d supply, fill #1

## 2023-11-05 NOTE — Progress Notes (Signed)
Complete physical exam  Patient: Shelia Ramos   DOB: 03/05/1971   52 y.o. Female  MRN: [id number]  Subjective:    Chief Complaint  Patient presents with   Annual Exam    MARIAPAZ Ramos is a 52 y.o. female who presents today for a complete physical exam. She reports consuming a  vegetarian  diet. The patient does not participate in regular exercise at present. She generally feels well. She reports sleeping poorly but is working with her psychiatrist. She is making efforts to lose weight.    Most recent fall risk assessment:    04/30/2023    8:35 AM  Fall Risk   Falls in the past year? 0  Number falls in past yr: 0  Injury with Fall? 0  Risk for fall due to : No Fall Risks  Follow up Falls evaluation completed     Most recent depression and anxiety screenings:    11/05/2023    8:31 AM 04/30/2023    8:36 AM  PHQ 2/9 Scores  PHQ - 2 Score 0 0  PHQ- 9 Score 0 2      11/05/2023    8:31 AM 10/30/2022    8:39 AM 03/19/2022    9:05 AM 01/28/2022    1:44 PM  GAD 7 : Generalized Anxiety Score  Nervous, Anxious, on Edge 0 0 0 0  Control/stop worrying 0 0 0 0  Worry too much - different things 0 0 0 0  Trouble relaxing 0 0 0 0  Restless 0 0 0 0  Easily annoyed or irritable 0 0 0 0  Afraid - awful might happen 0 0 0 0  Total GAD 7 Score 0 0 0 0  Anxiety Difficulty Not difficult at all Not difficult at all Not difficult at all     Patient Active Problem List   Diagnosis Date Noted   Vitamin D deficiency 05/30/2019   Environmental and seasonal allergies 05/30/2019   Mixed hyperlipidemia 08/25/2018   Bipolar disorder, curr episode depressed, severe, w/psychotic features (HCC) 05/03/2017   Bunion of great toe of left foot 10/09/2016   IBS (irritable bowel syndrome) 10/09/2016   Perimenopause- on Xulane 10/09/2016   Insomnia disorder 10/09/2016   Moderate persistent asthma-  09/28/2016   Allergic rhinoconjunctivitis 09/28/2016    Past Surgical History:  Procedure  Laterality Date   BUNIONECTOMY Right    x2   COLPOSCOPY     Social History   Tobacco Use   Smoking status: Never   Smokeless tobacco: Never  Vaping Use   Vaping status: Never Used  Substance Use Topics   Alcohol use: No    Alcohol/week: 0.0 standard drinks of alcohol   Drug use: No   Family History  Problem Relation Age of Onset   Anxiety disorder Mother    Depression Mother    Allergic rhinitis Father    Heart attack Maternal Grandfather    Allergic rhinitis Paternal Grandmother    Cancer Paternal Grandmother        stomach   Allergic rhinitis Paternal Aunt    Angioedema Neg Hx    Asthma Neg Hx    Atopy Neg Hx    Eczema Neg Hx    Immunodeficiency Neg Hx    Urticaria Neg Hx    Pancreatic cancer Neg Hx    Liver disease Neg Hx    Rectal cancer Neg Hx    Colon cancer Neg Hx    Esophageal cancer Neg Hx  No Known Allergies   Patient Care Team: Melida Quitter, PA as PCP - General (Family Medicine) Napoleon Form, MD as Consulting Physician (Gastroenterology) Asencion Islam, DPM as Consulting Physician (Podiatry) Carrington Clamp, MD as Consulting Physician (Obstetrics and Gynecology) Marcelyn Bruins, MD as Consulting Physician (Allergy) Lynden Ang, MD as Referring Physician (Psychiatry)   Outpatient Medications Prior to Visit  Medication Sig   benztropine (COGENTIN) 0.5 MG tablet Take 1 tablet (0.5 mg total) by mouth 2 (two) times daily.   carbamide peroxide (DEBROX) 6.5 % OTIC solution Place 5 drops into the right ear 2 times daily.   estradiol (ESTRACE) 0.5 MG tablet Take 1 tablet by mouth every day   estradiol (VIVELLE-DOT) 0.1 MG/24HR patch Place 1 patch (0.1 mg total) onto the skin 2 (two) times a week.   hydrOXYzine (VISTARIL) 25 MG capsule Take 1 capsule (25 mg total) by mouth 2 (two) times daily as needed.   hyoscyamine (LEVSIN SL) 0.125 MG SL tablet Place 1 tablet (0.125 mg) under the tongue at bedtime.   levonorgestrel (MIRENA,  52 MG,) 20 MCG/DAY IUD Mirena 20 mcg/24 hours (8 yrs) 52 mg intrauterine device  Take 1 device by intrauterine route.   mirtazapine (REMERON) 30 MG tablet Take 0.5-1 tablets (15-30 mg total) by mouth at bedtime.   risperiDONE (RISPERDAL) 3 MG tablet Take 1 tablet (3 mg total) by mouth every evening with dinner.   risperiDONE (RISPERDAL) 3 MG tablet Take 1 tablet (3 mg total) by mouth at bedtime.   [DISCONTINUED] albuterol (PROAIR HFA) 108 (90 Base) MCG/ACT inhaler Inhale 2 puffs into the lungs every 4 hours as needed for wheezing or shortness of breath.   [DISCONTINUED] montelukast (SINGULAIR) 5 MG chewable tablet CHEW 2 TABLETS BY MOUTH EVERY EVENING.   Hyoscyamine Sulfate SL 0.125 MG SUBL PLACE 1 TABLET UNDER THE TONGUE AT BEDTIME.   omeprazole (PRILOSEC) 20 MG capsule TAKE 1 CAPSULE BY MOUTH DAILY   No facility-administered medications prior to visit.    Review of Systems  Constitutional:  Negative for chills, fever and malaise/fatigue.  HENT:  Negative for congestion and hearing loss.        Right ear clogged for a few days, has resolved  Eyes:  Negative for blurred vision and double vision.  Respiratory:  Negative for cough and shortness of breath.   Cardiovascular:  Negative for chest pain, palpitations and leg swelling.  Gastrointestinal:  Negative for abdominal pain, constipation, diarrhea and heartburn.  Genitourinary:  Negative for frequency and urgency.  Musculoskeletal:  Negative for myalgias and neck pain.  Neurological:  Negative for headaches.  Endo/Heme/Allergies:  Negative for polydipsia.  Psychiatric/Behavioral:  Negative for depression. The patient is not nervous/anxious.       Objective:    BP (!) 89/61 (BP Location: Left Arm, Patient Position: Sitting, Cuff Size: Normal)   Pulse 92   Resp 18   Ht 5\' 3"  (1.6 m)   Wt 150 lb (68 kg)   SpO2 94%   BMI 26.57 kg/m    Physical Exam Constitutional:      General: She is not in acute distress.    Appearance:  Normal appearance.  HENT:     Head: Normocephalic and atraumatic.     Right Ear: External ear normal. There is impacted cerumen.     Left Ear: Tympanic membrane, ear canal and external ear normal.     Nose: Nose normal.     Mouth/Throat:     Mouth: Mucous membranes are  moist.     Pharynx: No oropharyngeal exudate or posterior oropharyngeal erythema.  Eyes:     Extraocular Movements: Extraocular movements intact.     Conjunctiva/sclera: Conjunctivae normal.     Pupils: Pupils are equal, round, and reactive to light.     Comments: Wearing glasses  Neck:     Thyroid: No thyroid mass, thyromegaly or thyroid tenderness.  Cardiovascular:     Rate and Rhythm: Normal rate and regular rhythm.     Heart sounds: Normal heart sounds. No murmur heard.    No friction rub. No gallop.  Pulmonary:     Effort: Pulmonary effort is normal. No respiratory distress.     Breath sounds: Normal breath sounds. No wheezing, rhonchi or rales.  Abdominal:     General: Abdomen is flat. Bowel sounds are normal. There is no distension.     Palpations: There is no mass.     Tenderness: There is no abdominal tenderness. There is no guarding.  Musculoskeletal:        General: Normal range of motion.     Cervical back: Normal range of motion and neck supple.  Lymphadenopathy:     Cervical: No cervical adenopathy.  Skin:    General: Skin is warm and dry.  Neurological:     Mental Status: She is alert and oriented to person, place, and time.     Cranial Nerves: No cranial nerve deficit.     Motor: No weakness.     Deep Tendon Reflexes: Reflexes normal.  Psychiatric:        Mood and Affect: Mood normal.       Assessment & Plan:    Routine Health Maintenance and Physical Exam  Immunization History  Administered Date(s) Administered   Influenza,inj,Quad PF,6+ Mos 10/09/2016, 09/18/2021   Influenza-Unspecified 09/27/2016, 10/13/2023   PFIZER(Purple Top)SARS-COV-2 Vaccination 07/01/2021, 07/22/2021   Tdap  08/25/2018   Zoster Recombinant(Shingrix) 09/18/2021, 03/19/2022    Health Maintenance  Topic Date Due   COVID-19 Vaccine (3 - 2023-24 season) 08/29/2023   MAMMOGRAM  05/29/2024   Colonoscopy  11/01/2024   Cervical Cancer Screening (HPV/Pap Cotest)  04/22/2025   DTaP/Tdap/Td (2 - Td or Tdap) 08/25/2028   INFLUENZA VACCINE  Completed   Hepatitis C Screening  Completed   HIV Screening  Completed   Zoster Vaccines- Shingrix  Completed   HPV VACCINES  Aged Out    Reviewed most recent labs including CBC, CMP, lipid panel, A1C, TSH, and vitamin D. All within normal limits/stable from last check other than creatinine increased to 1.11 from 1.08, GFR 59, calcium 8.5 but corrected calcium within normal range at 8.7.  Recheck renal function in 3 months and adjust management according to results.  Discussed increasing hydration with goal of at least 64 ounces daily.  Discussed health benefits of physical activity, and encouraged her to engage in regular exercise appropriate for her age and condition.  Wellness examination  Allergic rhinoconjunctivitis Assessment & Plan: Continue montelukast 5 mg daily.  Add Claritin during allergy season for better control of allergic rhinosinusitis and rhinoconjunctivitis.  Orders: -     Montelukast Sodium; Chew 2 tablets (10 mg total) by mouth every evening.  Dispense: 180 tablet; Refill: 1  Moderate persistent asthma, uncomplicated-    Assessment & Plan: Stable, lungs CTA bilaterally.  Continue montelukast 5 mg daily and rescue albuterol inhaler as needed.  Will continue to monitor.  Orders: -     Montelukast Sodium; Chew 2 tablets (10 mg total) by mouth  every evening.  Dispense: 180 tablet; Refill: 1 -     Albuterol Sulfate HFA; Inhale 2 puffs into the lungs every 4 hours as needed for wheezing or shortness of breath.  Dispense: 6.7 g; Refill: 1  Environmental and seasonal allergies-  -     Montelukast Sodium; Chew 2 tablets (10 mg total) by mouth  every evening.  Dispense: 180 tablet; Refill: 1  Bipolar disorder, curr episode depressed, severe, w/psychotic features [facility]) Assessment & Plan: Followed by psychiatry.  PHQ-9 score of 0, GAD-7 score of 10.  Continue risperidone 3 mg daily with Cogentin 0.5 mg twice daily for risperidone side effects as prescribed by psychiatry.  Continue hydroxyzine 25 mg up to twice daily as needed.  Will continue to monitor.   Mixed hyperlipidemia Assessment & Plan: Recheck lipid panel with labs in 3 months.   Recommended Debrox otic drops for cerumen of right ear, patient plans to pick up over-the-counter.  Also discussed strategies for weight management, provided resources including podcast and online nutrition information.  Return in about 3 months (around 02/05/2024) for fasting blood work; 6 months follow up for asthma, weight management, renal labs.     Melida Quitter, PA

## 2023-11-05 NOTE — Assessment & Plan Note (Signed)
Followed by psychiatry.  PHQ-9 score of 0, GAD-7 score of 10.  Continue risperidone 3 mg daily with Cogentin 0.5 mg twice daily for risperidone side effects as prescribed by psychiatry.  Continue hydroxyzine 25 mg up to twice daily as needed.  Will continue to monitor.

## 2023-11-05 NOTE — Assessment & Plan Note (Signed)
Stable, lungs CTA bilaterally.  Continue montelukast 5 mg daily and rescue albuterol inhaler as needed.  Will continue to monitor.

## 2023-11-05 NOTE — Assessment & Plan Note (Signed)
Recheck lipid panel with labs in 3 months.

## 2023-11-05 NOTE — Patient Instructions (Addendum)
FLUID INTAKE: -Aim for at least 64 oz fluid each day  EARS: -Debrox (carbamide peroxide) ear drops  WEIGHT MANAGEMENT: -Podcast: The Obesity Guide with Matthea Rentea -Nutrition information: Leane Call [email or web address]  TikTok:b https://www.tiktok.com/@simplyhealthyrd ?lang=en @simplyhealthyrd  Instagram: http://www.[email or web address].info/ [email or web address]  Website: [email or web address]   SUPPLEMENT: -Vitamin B12 (on its own or in a multivitamin)

## 2023-11-05 NOTE — Assessment & Plan Note (Signed)
Continue montelukast 5 mg daily.  Add Claritin during allergy season for better control of allergic rhinosinusitis and rhinoconjunctivitis.

## 2023-11-09 ENCOUNTER — Other Ambulatory Visit (HOSPITAL_COMMUNITY): Payer: Self-pay

## 2023-11-09 ENCOUNTER — Other Ambulatory Visit: Payer: Self-pay

## 2023-11-10 ENCOUNTER — Other Ambulatory Visit (HOSPITAL_COMMUNITY): Payer: Self-pay

## 2023-11-10 MED ORDER — BENZTROPINE MESYLATE 0.5 MG PO TABS
0.5000 mg | ORAL_TABLET | Freq: Two times a day (BID) | ORAL | 3 refills | Status: DC
  Filled 2023-11-10: qty 180, 90d supply, fill #0
  Filled 2024-05-08: qty 180, 90d supply, fill #1
  Filled 2024-10-13: qty 180, 90d supply, fill #2

## 2023-11-12 ENCOUNTER — Other Ambulatory Visit (HOSPITAL_COMMUNITY): Payer: Self-pay

## 2023-11-17 ENCOUNTER — Other Ambulatory Visit: Payer: Self-pay

## 2023-11-24 ENCOUNTER — Other Ambulatory Visit (HOSPITAL_COMMUNITY): Payer: Self-pay

## 2023-12-11 ENCOUNTER — Other Ambulatory Visit (HOSPITAL_COMMUNITY): Payer: Self-pay

## 2023-12-14 ENCOUNTER — Other Ambulatory Visit (HOSPITAL_COMMUNITY): Payer: Self-pay

## 2024-02-03 ENCOUNTER — Encounter: Payer: Self-pay | Admitting: Family Medicine

## 2024-02-11 ENCOUNTER — Other Ambulatory Visit (HOSPITAL_COMMUNITY): Payer: Self-pay

## 2024-02-11 ENCOUNTER — Other Ambulatory Visit: Payer: Commercial Managed Care - PPO

## 2024-02-11 DIAGNOSIS — F315 Bipolar disorder, current episode depressed, severe, with psychotic features: Secondary | ICD-10-CM

## 2024-02-11 DIAGNOSIS — E559 Vitamin D deficiency, unspecified: Secondary | ICD-10-CM | POA: Diagnosis not present

## 2024-02-11 DIAGNOSIS — E782 Mixed hyperlipidemia: Secondary | ICD-10-CM

## 2024-02-12 LAB — BASIC METABOLIC PANEL
BUN/Creatinine Ratio: 9 (ref 9–23)
BUN: 10 mg/dL (ref 6–24)
CO2: 21 mmol/L (ref 20–29)
Calcium: 8.4 mg/dL — ABNORMAL LOW (ref 8.7–10.2)
Chloride: 104 mmol/L (ref 96–106)
Creatinine, Ser: 1.12 mg/dL — ABNORMAL HIGH (ref 0.57–1.00)
Glucose: 101 mg/dL — ABNORMAL HIGH (ref 70–99)
Potassium: 4.4 mmol/L (ref 3.5–5.2)
Sodium: 138 mmol/L (ref 134–144)
eGFR: 58 mL/min/{1.73_m2} — ABNORMAL LOW (ref >59)

## 2024-02-12 LAB — LIPID PANEL
Chol/HDL Ratio: 5.2 {ratio} — ABNORMAL HIGH (ref 0.0–4.4)
Cholesterol, Total: 177 mg/dL (ref 100–199)
HDL: 34 mg/dL — ABNORMAL LOW (ref >39)
LDL Chol Calc (NIH): 120 mg/dL — ABNORMAL HIGH (ref 0–99)
Triglycerides: 126 mg/dL (ref 0–149)
VLDL Cholesterol Cal: 23 mg/dL (ref 5–40)

## 2024-02-12 LAB — VITAMIN D 25 HYDROXY (VIT D DEFICIENCY, FRACTURES): Vit D, 25-Hydroxy: 22.2 ng/mL — ABNORMAL LOW (ref 30.0–100.0)

## 2024-02-15 ENCOUNTER — Encounter: Payer: Self-pay | Admitting: Family Medicine

## 2024-02-15 ENCOUNTER — Other Ambulatory Visit (HOSPITAL_COMMUNITY): Payer: Self-pay

## 2024-02-15 ENCOUNTER — Other Ambulatory Visit: Payer: Self-pay | Admitting: Family Medicine

## 2024-02-15 DIAGNOSIS — E559 Vitamin D deficiency, unspecified: Secondary | ICD-10-CM

## 2024-02-15 MED ORDER — VITAMIN D (ERGOCALCIFEROL) 1.25 MG (50000 UNIT) PO CAPS
50000.0000 [IU] | ORAL_CAPSULE | ORAL | 0 refills | Status: DC
  Filled 2024-02-15: qty 12, 84d supply, fill #0

## 2024-02-21 ENCOUNTER — Other Ambulatory Visit (HOSPITAL_COMMUNITY): Payer: Self-pay

## 2024-02-23 ENCOUNTER — Other Ambulatory Visit: Payer: Self-pay

## 2024-02-23 ENCOUNTER — Other Ambulatory Visit (HOSPITAL_COMMUNITY): Payer: Self-pay

## 2024-02-23 DIAGNOSIS — F411 Generalized anxiety disorder: Secondary | ICD-10-CM | POA: Diagnosis not present

## 2024-02-23 DIAGNOSIS — F319 Bipolar disorder, unspecified: Secondary | ICD-10-CM | POA: Diagnosis not present

## 2024-02-23 DIAGNOSIS — G4700 Insomnia, unspecified: Secondary | ICD-10-CM | POA: Diagnosis not present

## 2024-02-23 MED ORDER — MIRTAZAPINE 30 MG PO TABS
15.0000 mg | ORAL_TABLET | Freq: Every day | ORAL | 2 refills | Status: DC
  Filled 2024-02-23: qty 90, 90d supply, fill #0
  Filled 2024-05-19: qty 90, 90d supply, fill #1

## 2024-03-02 ENCOUNTER — Ambulatory Visit: Payer: Self-pay | Admitting: Family Medicine

## 2024-03-02 NOTE — Telephone Encounter (Signed)
 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 Pt inquired about what medication OTC she could take for possible covid. RN advised to call back if she had further questions.  Reason for Disposition  General information question, no triage required and triager able to answer question  Answer Assessment - Initial Assessment Questions 1. REASON FOR CALL or QUESTION: "What is your reason for calling today?" or "How can I best help you?" or "What question do you have that I can help answer?"     Which OTC medication is best  Protocols used: Information Only Call - No Triage-A-AH

## 2024-03-16 ENCOUNTER — Other Ambulatory Visit (HOSPITAL_COMMUNITY): Payer: Self-pay

## 2024-04-12 ENCOUNTER — Other Ambulatory Visit (HOSPITAL_COMMUNITY): Payer: Self-pay

## 2024-04-13 ENCOUNTER — Other Ambulatory Visit (HOSPITAL_COMMUNITY): Payer: Self-pay

## 2024-04-13 ENCOUNTER — Other Ambulatory Visit: Payer: Self-pay

## 2024-04-13 MED ORDER — RISPERIDONE 3 MG PO TABS
3.0000 mg | ORAL_TABLET | Freq: Every day | ORAL | 3 refills | Status: DC
  Filled 2024-04-13: qty 90, 90d supply, fill #0
  Filled 2024-10-13: qty 90, 90d supply, fill #1

## 2024-05-05 ENCOUNTER — Ambulatory Visit: Payer: Commercial Managed Care - PPO | Admitting: Family Medicine

## 2024-05-08 ENCOUNTER — Other Ambulatory Visit (HOSPITAL_COMMUNITY): Payer: Self-pay

## 2024-05-08 ENCOUNTER — Other Ambulatory Visit: Payer: Self-pay | Admitting: Family Medicine

## 2024-05-08 DIAGNOSIS — H1010 Acute atopic conjunctivitis, unspecified eye: Secondary | ICD-10-CM

## 2024-05-08 DIAGNOSIS — J4540 Moderate persistent asthma, uncomplicated: Secondary | ICD-10-CM

## 2024-05-08 DIAGNOSIS — J3089 Other allergic rhinitis: Secondary | ICD-10-CM

## 2024-05-08 MED ORDER — MONTELUKAST SODIUM 5 MG PO CHEW
10.0000 mg | CHEWABLE_TABLET | Freq: Every evening | ORAL | 1 refills | Status: DC
  Filled 2024-05-08: qty 180, 90d supply, fill #0
  Filled 2024-08-10: qty 180, 90d supply, fill #1

## 2024-05-09 ENCOUNTER — Other Ambulatory Visit: Payer: Self-pay

## 2024-05-19 ENCOUNTER — Other Ambulatory Visit (HOSPITAL_COMMUNITY): Payer: Self-pay

## 2024-06-19 ENCOUNTER — Other Ambulatory Visit (HOSPITAL_COMMUNITY): Payer: Self-pay

## 2024-06-22 ENCOUNTER — Other Ambulatory Visit (HOSPITAL_COMMUNITY): Payer: Self-pay

## 2024-06-22 DIAGNOSIS — F315 Bipolar disorder, current episode depressed, severe, with psychotic features: Secondary | ICD-10-CM | POA: Diagnosis not present

## 2024-06-22 DIAGNOSIS — F411 Generalized anxiety disorder: Secondary | ICD-10-CM | POA: Diagnosis not present

## 2024-06-22 MED ORDER — MIRTAZAPINE 30 MG PO TABS
30.0000 mg | ORAL_TABLET | Freq: Every day | ORAL | 1 refills | Status: DC
  Filled 2024-06-22 – 2024-08-29 (×2): qty 90, 90d supply, fill #0

## 2024-06-22 MED ORDER — RISPERIDONE 3 MG PO TABS
3.0000 mg | ORAL_TABLET | Freq: Every day | ORAL | 1 refills | Status: AC
  Filled 2024-06-22 – 2024-06-24 (×2): qty 90, 90d supply, fill #0
  Filled 2025-01-12: qty 90, 90d supply, fill #1

## 2024-06-22 MED ORDER — BENZTROPINE MESYLATE 0.5 MG PO TABS
0.5000 mg | ORAL_TABLET | Freq: Every day | ORAL | 1 refills | Status: AC
  Filled 2024-06-22: qty 90, 90d supply, fill #0

## 2024-06-24 ENCOUNTER — Other Ambulatory Visit (HOSPITAL_COMMUNITY): Payer: Self-pay

## 2024-06-29 ENCOUNTER — Ambulatory Visit: Admitting: Family Medicine

## 2024-07-18 ENCOUNTER — Other Ambulatory Visit (HOSPITAL_COMMUNITY): Payer: Self-pay

## 2024-07-18 ENCOUNTER — Ambulatory Visit

## 2024-07-18 VITALS — BP 84/56 | HR 95 | Temp 98.1°F | Ht 63.0 in | Wt 147.1 lb

## 2024-07-18 DIAGNOSIS — E782 Mixed hyperlipidemia: Secondary | ICD-10-CM | POA: Diagnosis not present

## 2024-07-18 DIAGNOSIS — F315 Bipolar disorder, current episode depressed, severe, with psychotic features: Secondary | ICD-10-CM

## 2024-07-18 DIAGNOSIS — R7989 Other specified abnormal findings of blood chemistry: Secondary | ICD-10-CM | POA: Diagnosis not present

## 2024-07-18 DIAGNOSIS — J4540 Moderate persistent asthma, uncomplicated: Secondary | ICD-10-CM | POA: Diagnosis not present

## 2024-07-18 MED ORDER — ALBUTEROL SULFATE HFA 108 (90 BASE) MCG/ACT IN AERS
2.0000 | INHALATION_SPRAY | RESPIRATORY_TRACT | 1 refills | Status: DC | PRN
Start: 2024-07-18 — End: 2024-09-05
  Filled 2024-07-18: qty 6.7, 17d supply, fill #0

## 2024-07-18 NOTE — Patient Instructions (Signed)
 It was nice to see you today!  As we discussed in clinic:  -I have sent in a refill of your albuterol  inhaler. If your wheezing/asthma symptoms do not improve in 4-6 weeks, please let me know and we can replace the albuterol  inhaler with AirSupra.  -We will get fasting blood work from you this Friday and you will hear from me via MyChart with the results.  -For now, continue all of your current medications as prescribed.   We will plan for your physical in 6 months!  If you have any problems before your next visit feel free to message me via MyChart (minor issues or questions) or call the office, otherwise you may reach out to schedule an office visit.  Thank you! Saddie Sacks, PA-C

## 2024-07-18 NOTE — Assessment & Plan Note (Signed)
 Followed by psychiatry.  PHQ-9 and GAD-7 scores both 0 today.  Continue with Coumadin 3 mg daily with Cogentin  0.5 mg twice daily for risperidone  side effects as prescribed by psychiatry.  Continue hydroxyzine  25 mg up to twice daily as needed.  Advised her to continue regular follow-up with psychiatry, which she has scheduled in September.  Will continue to monitor.

## 2024-07-18 NOTE — Assessment & Plan Note (Signed)
 Rechecking lipid panel today.  Last lipid panel from February: LDL 120, HDL 34, triglycerides 126. The ASCVD Risk score (Arnett DK, et al., 2019) failed to calculate for the following reasons:   The valid systolic blood pressure range is 90 to 200 mmHg Advised patient that if her lipid panel this week reveals that her LDL is still elevated, it would be a good idea to initiate a low-dose statin medication to reduce her risk of cardiac events and lower her cholesterol.

## 2024-07-18 NOTE — Progress Notes (Signed)
 Established Patient Office Visit  Subjective   Patient ID: Shelia Ramos, female    DOB: Nov 03, 1971  Age: 53 y.o. MRN: [id number]  Chief Complaint  Patient presents with   Medication Management    HPI  Shelia Ramos is a 53 y.o. y/o female who presents to the clinic today for follow up on asthma, hyperlipidemia, elevated serum creatinine.   Asthma: Patient currently taking Singulair  10 mg daily for control of her asthma/allergic rhinitis symptoms.  Patient reports that she is tolerating this medication well.  She also has an albuterol  inhaler that she uses as needed for wheezing/chest tightness.  Reports that she is still getting over a cold that she was fighting over a month ago, so she has noticed more wheezing recently and is using her albuterol  inhaler more, however does report that her cough/wheezing/other associated symptoms are improving.  Hyperlipidemia: Patient not currently taking any medications for cholesterol.  Currently managing cholesterol levels with diet and exercise.  Reports that she has been trying to avoid foods high in saturated/trans fats.  Elevated serum creatinine: Patient's labs have revealed a mildly elevated serum creatinine and EGFR dating back to September 2022.  Patient has never had an ultrasound of the kidneys or been diagnosed with chronic kidney disease in the past.  Denies symptoms that she is aware of and reports that she overall feels well.  Reports that at her last appointment with [facility], it was discussed that if kidney labs remained abnormal, it would be in her best interest to get an ultrasound of her kidneys.  Patient reports today that if her lab work this week reveals abnormal kidney function, she would like to follow through with the ultrasound.    ROS Per HPI.    Objective:     BP (!) 84/56   Pulse 95   Temp 98.1 F (36.7 C) (Oral)   Ht 5' 3 (1.6 m)   Wt 147 lb 1.3 oz (66.7 kg)   SpO2 96%   BMI 26.05 kg/m    Physical  Exam Constitutional:      General: She is not in acute distress.    Appearance: Normal appearance.  Cardiovascular:     Rate and Rhythm: Normal rate and regular rhythm.     Heart sounds: Normal heart sounds. No murmur heard.    No friction rub. No gallop.  Pulmonary:     Effort: Pulmonary effort is normal. No respiratory distress.     Breath sounds: Normal breath sounds.  Musculoskeletal:        General: No swelling.  Skin:    General: Skin is warm and dry.  Neurological:     General: No focal deficit present.     Mental Status: She is alert.  Psychiatric:        Mood and Affect: Mood normal.        Behavior: Behavior normal.        Thought Content: Thought content normal.    No results found for any visits on 07/18/24.  Last metabolic panel Lab Results  Component Value Date   GLUCOSE 101 (H) 02/11/2024   NA 138 02/11/2024   K 4.4 02/11/2024   CL 104 02/11/2024   CO2 21 02/11/2024   BUN 10 02/11/2024   CREATININE 1.12 (H) 02/11/2024   EGFR 58 (L) 02/11/2024   CALCIUM 8.4 (L) 02/11/2024   PROT 6.9 10/29/2023   ALBUMIN 3.8 10/29/2023   LABGLOB 3.1 10/29/2023   AGRATIO 1.4 04/30/2023  BILITOT 0.3 10/29/2023   ALKPHOS 72 10/29/2023   AST 13 10/29/2023   ALT 11 10/29/2023   ANIONGAP 11 04/30/2017   Last lipids Lab Results  Component Value Date   CHOL 177 02/11/2024   HDL 34 (L) 02/11/2024   LDLCALC 120 (H) 02/11/2024   TRIG 126 02/11/2024   CHOLHDL 5.2 (H) 02/11/2024      The ASCVD Risk score (Arnett DK, et al., 2019) failed to calculate for the following reasons:   The valid systolic blood pressure range is 90 to 200 mmHg    Assessment & Plan:   Mixed hyperlipidemia Assessment & Plan: Rechecking lipid panel today.  Last lipid panel from February: LDL 120, HDL 34, triglycerides 126. The ASCVD Risk score (Arnett DK, et al., 2019) failed to calculate for the following reasons:   The valid systolic blood pressure range is 90 to 200 mmHg Advised patient  that if her lipid panel this week reveals that her LDL is still elevated, it would be a good idea to initiate a low-dose statin medication to reduce her risk of cardiac events and lower her cholesterol.  Orders: -     CBC with Differential/Platelet; Future -     Comprehensive metabolic panel with GFR; Future -     Lipid panel; Future  Moderate persistent asthma, uncomplicated-    Assessment & Plan: Stable.  Lungs clear to auscultation bilaterally in the office today.  Continue Singulair  10 mg daily and rescue albuterol  inhaler as needed.  Advised patient that if her wheezing/asthma symptoms fail to improve or worsen over the next several weeks, she would be a good candidate for Airsupra as a replacement for rescue therapy.  Orders: -     Albuterol  Sulfate HFA; Inhale 2 puffs into the lungs every 4 hours as needed for wheezing or shortness of breath.  Dispense: 6.7 g; Refill: 1 -     CBC with Differential/Platelet; Future  Elevated serum creatinine Assessment & Plan: Labs from February revealed a creatinine of 1.12 and an EGFR of 58.  Kidney function has been suboptimal dating back to 2022.  Will recheck CMP to reassess kidney function this week.  Advised patient that if kidney function is still suboptimal, I will place an order for an ultrasound of her kidneys to evaluate further causes of this abnormality.  Orders: -     Comprehensive metabolic panel with GFR; Future  Bipolar disorder, curr episode depressed, severe, w/psychotic features [facility]) Assessment & Plan: Followed by psychiatry.  PHQ-9 and GAD-7 scores both 0 today.  Continue with Coumadin 3 mg daily with Cogentin  0.5 mg twice daily for risperidone  side effects as prescribed by psychiatry.  Continue hydroxyzine  25 mg up to twice daily as needed.  Advised her to continue regular follow-up with psychiatry, which she has scheduled in September.  Will continue to monitor.     Return in about 6 months (around 01/18/2025) for Physical.     Shelia JULIANNA Sacks, PA-C

## 2024-07-18 NOTE — Assessment & Plan Note (Signed)
 Stable.  Lungs clear to auscultation bilaterally in the office today.  Continue Singulair  10 mg daily and rescue albuterol  inhaler as needed.  Advised patient that if her wheezing/asthma symptoms fail to improve or worsen over the next several weeks, she would be a good candidate for Airsupra as a replacement for rescue therapy.

## 2024-07-18 NOTE — Assessment & Plan Note (Signed)
 Labs from February revealed a creatinine of 1.12 and an EGFR of 58.  Kidney function has been suboptimal dating back to 2022.  Will recheck CMP to reassess kidney function this week.  Advised patient that if kidney function is still suboptimal, I will place an order for an ultrasound of her kidneys to evaluate further causes of this abnormality.

## 2024-07-21 ENCOUNTER — Other Ambulatory Visit

## 2024-07-21 DIAGNOSIS — E782 Mixed hyperlipidemia: Secondary | ICD-10-CM | POA: Diagnosis not present

## 2024-07-21 DIAGNOSIS — R7989 Other specified abnormal findings of blood chemistry: Secondary | ICD-10-CM | POA: Diagnosis not present

## 2024-07-21 DIAGNOSIS — J4540 Moderate persistent asthma, uncomplicated: Secondary | ICD-10-CM | POA: Diagnosis not present

## 2024-07-21 DIAGNOSIS — H524 Presbyopia: Secondary | ICD-10-CM | POA: Diagnosis not present

## 2024-07-22 LAB — COMPREHENSIVE METABOLIC PANEL WITH GFR
ALT: 9 IU/L (ref 0–32)
AST: 11 IU/L (ref 0–40)
Albumin: 4 g/dL (ref 3.8–4.9)
Alkaline Phosphatase: 74 IU/L (ref 44–121)
BUN/Creatinine Ratio: 18 (ref 9–23)
BUN: 19 mg/dL (ref 6–24)
Bilirubin Total: 0.2 mg/dL (ref 0.0–1.2)
CO2: 20 mmol/L (ref 20–29)
Calcium: 10.6 mg/dL — ABNORMAL HIGH (ref 8.7–10.2)
Chloride: 104 mmol/L (ref 96–106)
Creatinine, Ser: 1.06 mg/dL — ABNORMAL HIGH (ref 0.57–1.00)
Globulin, Total: 3.4 g/dL (ref 1.5–4.5)
Glucose: 102 mg/dL — ABNORMAL HIGH (ref 70–99)
Potassium: 4.4 mmol/L (ref 3.5–5.2)
Sodium: 139 mmol/L (ref 134–144)
Total Protein: 7.4 g/dL (ref 6.0–8.5)
eGFR: 62 mL/min/1.73 (ref >59)

## 2024-07-22 LAB — LIPID PANEL
Chol/HDL Ratio: 3.5 ratio (ref 0.0–4.4)
Cholesterol, Total: 166 mg/dL (ref 100–199)
HDL: 47 mg/dL (ref >39)
LDL Chol Calc (NIH): 102 mg/dL — ABNORMAL HIGH (ref 0–99)
Triglycerides: 94 mg/dL (ref 0–149)
VLDL Cholesterol Cal: 17 mg/dL (ref 5–40)

## 2024-07-22 LAB — CBC WITH DIFFERENTIAL/PLATELET
Basophils Absolute: 0 x10E3/uL (ref 0.0–0.2)
Basos: 1 %
EOS (ABSOLUTE): 0.5 x10E3/uL — ABNORMAL HIGH (ref 0.0–0.4)
Eos: 7 %
Hematocrit: 34.5 % (ref 34.0–46.6)
Hemoglobin: 10.5 g/dL — ABNORMAL LOW (ref 11.1–15.9)
Immature Grans (Abs): 0 x10E3/uL (ref 0.0–0.1)
Immature Granulocytes: 0 %
Lymphocytes Absolute: 2.3 x10E3/uL (ref 0.7–3.1)
Lymphs: 36 %
MCH: 25.9 pg — ABNORMAL LOW (ref 26.6–33.0)
MCHC: 30.4 g/dL — ABNORMAL LOW (ref 31.5–35.7)
MCV: 85 fL (ref 79–97)
Monocytes Absolute: 0.5 x10E3/uL (ref 0.1–0.9)
Monocytes: 7 %
Neutrophils Absolute: 3.2 x10E3/uL (ref 1.4–7.0)
Neutrophils: 48 %
Platelets: 372 x10E3/uL (ref 150–450)
RBC: 4.06 x10E6/uL (ref 3.77–5.28)
RDW: 15.1 % (ref 11.7–15.4)
WBC: 6.5 x10E3/uL (ref 3.4–10.8)

## 2024-07-24 ENCOUNTER — Ambulatory Visit: Payer: Self-pay

## 2024-08-10 ENCOUNTER — Other Ambulatory Visit (HOSPITAL_COMMUNITY): Payer: Self-pay

## 2024-08-17 ENCOUNTER — Ambulatory Visit: Admitting: Pulmonary Disease

## 2024-08-23 ENCOUNTER — Other Ambulatory Visit (HOSPITAL_COMMUNITY): Payer: Self-pay

## 2024-08-29 ENCOUNTER — Other Ambulatory Visit (HOSPITAL_COMMUNITY): Payer: Self-pay

## 2024-09-05 ENCOUNTER — Other Ambulatory Visit (HOSPITAL_COMMUNITY): Payer: Self-pay

## 2024-09-05 ENCOUNTER — Ambulatory Visit (INDEPENDENT_AMBULATORY_CARE_PROVIDER_SITE_OTHER): Admitting: Pulmonary Disease

## 2024-09-05 ENCOUNTER — Encounter: Payer: Self-pay | Admitting: Pulmonary Disease

## 2024-09-05 VITALS — BP 106/76 | HR 93 | Temp 97.9°F | Ht 63.0 in | Wt 153.6 lb

## 2024-09-05 DIAGNOSIS — R011 Cardiac murmur, unspecified: Secondary | ICD-10-CM

## 2024-09-05 DIAGNOSIS — R0602 Shortness of breath: Secondary | ICD-10-CM

## 2024-09-05 DIAGNOSIS — J4550 Severe persistent asthma, uncomplicated: Secondary | ICD-10-CM

## 2024-09-05 DIAGNOSIS — R053 Chronic cough: Secondary | ICD-10-CM

## 2024-09-05 LAB — NITRIC OXIDE: Nitric Oxide: 117

## 2024-09-05 MED ORDER — BREYNA 160-4.5 MCG/ACT IN AERO
2.0000 | INHALATION_SPRAY | Freq: Two times a day (BID) | RESPIRATORY_TRACT | 11 refills | Status: AC
  Filled 2024-09-05: qty 10.2, 30d supply, fill #0
  Filled 2024-10-05: qty 10.2, 30d supply, fill #1
  Filled 2025-01-12: qty 10.2, 30d supply, fill #2

## 2024-09-05 MED ORDER — ALBUTEROL SULFATE (2.5 MG/3ML) 0.083% IN NEBU
2.5000 mg | INHALATION_SOLUTION | Freq: Once | RESPIRATORY_TRACT | Status: AC
  Administered 2024-09-05: 2.5 mg via RESPIRATORY_TRACT

## 2024-09-05 MED ORDER — ALBUTEROL SULFATE HFA 108 (90 BASE) MCG/ACT IN AERS
2.0000 | INHALATION_SPRAY | RESPIRATORY_TRACT | 3 refills | Status: DC | PRN
  Filled 2024-09-05: qty 6.7, 17d supply, fill #0

## 2024-09-05 NOTE — Patient Instructions (Signed)
 VISIT SUMMARY:  During your visit, we discussed your chronic cough and asthma symptoms, as well as a newly detected heart murmur and potential allergies. We have made some changes to your medications and planned further tests to better understand and manage your conditions.  YOUR PLAN:  -ASTHMA WITH CHRONIC COUGH: Your chronic cough is likely due to asthma, which has returned after being resolved since childhood. Asthma causes inflammation and narrowing of the airways, leading to symptoms like coughing, wheezing, and chest tightness. We have prescribed a Breyna  inhaler to use twice daily and an albuterol  inhaler for rescue use during symptoms. Please discontinue Singulair  due to a potential interaction with your antidepressant. We also administered a nebulizer treatment in the office and scheduled a pulmonary function test to assess your lung function.  -CARDIAC MURMUR, UNSPECIFIED: A mild heart murmur was detected during your examination. A heart murmur is an unusual sound heard between heartbeats, which can be harmless or indicate an underlying heart condition. We have ordered an echocardiogram to evaluate this further and will discuss the results once available. Please let us  know if you have ever seen a cardiologist.  -SUSPECTED ALLERGIC RHINITIS: You have a history of allergies and previously received allergy shots. Allergic rhinitis is an allergic reaction that causes sneezing, congestion, and a runny nose. We have ordered blood work to check for specific allergies and will review the results to determine the best course of action.  INSTRUCTIONS:  Please obtain a chest x-ray and schedule a follow-up appointment in 2-3 weeks to reassess your condition and the effectiveness of the treatment. Additionally, we will conduct a pulmonary function test and an echocardiogram to further evaluate your asthma and heart murmur, respectively.

## 2024-09-05 NOTE — Progress Notes (Unsigned)
 Subjective:    Patient ID: Shelia Ramos, female    DOB: January 11, 1971, 53 y.o.   MRN: [id number]  Patient Care Team: Gayle Saddie JULIANNA DEVONNA as PCP - General (Physician Assistant) Shila Gustav GAILS, MD as Consulting Physician (Gastroenterology) Stover, Titorya, DPM as Consulting Physician (Podiatry) Sarrah Browning, MD as Consulting Physician (Obstetrics and Gynecology) Jeneal Danita Macintosh, MD as Consulting Physician (Allergy) Noralyn Flock, MD as Referring Physician (Psychiatry)  Chief Complaint  Patient presents with  . Consult    BACKGROUND:   HPI  DATA 10/29/2023 eosinophils: 700 cells/uL 09/02/2024 nitric oxide : 123 ppb 09/05/2024 nitric oxide : 117 ppb  Review of Systems A 10 point review of systems was performed and it is as noted above otherwise negative.   Past Medical History:  Diagnosis Date  . Allergy    SEASONAL  . Anxiety   . Asthma    AS A CHILD  . Depression   . IBS (irritable bowel syndrome)   . Seasonal allergies     Past Surgical History:  Procedure Laterality Date  . BUNIONECTOMY Right    x2  . COLPOSCOPY      Patient Active Problem List   Diagnosis Date Noted  . Elevated serum creatinine 07/18/2024  . Vitamin D  deficiency 05/30/2019  . Environmental and seasonal allergies 05/30/2019  . Mixed hyperlipidemia 08/25/2018  . Bipolar disorder, curr episode depressed, severe, w/psychotic features (HCC) 05/03/2017  . Bunion of great toe of left foot 10/09/2016  . IBS (irritable bowel syndrome) 10/09/2016  . Perimenopause- on Xulane 10/09/2016  . Insomnia disorder 10/09/2016  . Moderate persistent asthma-  09/28/2016  . Allergic rhinoconjunctivitis 09/28/2016    Family History  Problem Relation Age of Onset  . Anxiety disorder Mother   . Depression Mother   . Allergic rhinitis Father   . Heart attack Maternal Grandfather   . Allergic rhinitis Paternal Grandmother   . Cancer Paternal Grandmother        stomach  . Allergic  rhinitis Paternal Aunt   . Angioedema Neg Hx   . Asthma Neg Hx   . Atopy Neg Hx   . Eczema Neg Hx   . Immunodeficiency Neg Hx   . Urticaria Neg Hx   . Pancreatic cancer Neg Hx   . Liver disease Neg Hx   . Rectal cancer Neg Hx   . Colon cancer Neg Hx   . Esophageal cancer Neg Hx     Social History   Tobacco Use  . Smoking status: Never  . Smokeless tobacco: Never  Substance Use Topics  . Alcohol use: No    Alcohol/week: 0.0 standard drinks of alcohol    No Known Allergies  Current Meds  Medication Sig  . albuterol  (PROAIR  HFA) 108 (90 Base) MCG/ACT inhaler Inhale 2 puffs into the lungs every 4 hours as needed for wheezing or shortness of breath.  . benztropine  (COGENTIN ) 0.5 MG tablet Take 1 tablet (0.5 mg total) by mouth at bedtime.  . estradiol  (VIVELLE -DOT) 0.1 MG/24HR patch Place 1 patch (0.1 mg total) onto the skin 2 (two) times a week.  . hydrOXYzine  (VISTARIL ) 25 MG capsule Take 1 capsule (25 mg total) by mouth 2 (two) times daily as needed.  . levonorgestrel  (MIRENA , 52 MG,) 20 MCG/DAY IUD Mirena  20 mcg/24 hours (8 yrs) 52 mg intrauterine device  Take 1 device by intrauterine route.  . mirtazapine  (REMERON ) 30 MG tablet Take 1 tablet (30 mg total) by mouth at bedtime.  . montelukast  (SINGULAIR ) 5  MG chewable tablet Chew 2 tablets (10 mg total) by mouth every evening.  . risperiDONE  (RISPERDAL ) 3 MG tablet Take 1 tablet (3 mg total) by mouth at bedtime.    Immunization History  Administered Date(s) Administered  . Influenza,inj,Quad PF,6+ Mos 10/09/2016, 09/18/2021  . Influenza-Unspecified 09/27/2016, 10/13/2023  . PFIZER(Purple Top)SARS-COV-2 Vaccination 07/01/2021, 07/22/2021  . Tdap 08/25/2018  . Zoster Recombinant(Shingrix) 09/18/2021, 03/19/2022        Objective:     Ht 5' 3 (1.6 m)   Wt 153 lb 9.6 oz (69.7 kg)   BMI 27.21 kg/m      GENERAL: HEAD: Normocephalic, atraumatic.  EYES: Pupils equal, round, reactive to light.  No scleral icterus.   MOUTH:  NECK: Supple. No thyromegaly. Trachea midline. No JVD.  No adenopathy. PULMONARY: Good air entry bilaterally.  No adventitious sounds. CARDIOVASCULAR: S1 and S2. Regular rate and rhythm.  ABDOMEN: MUSCULOSKELETAL: No joint deformity, no clubbing, no edema.  NEUROLOGIC:  SKIN: Intact,warm,dry. PSYCH:        Assessment & Plan:   No diagnosis found.  No orders of the defined types were placed in this encounter.   No orders of the defined types were placed in this encounter.    Advised if symptoms do not improve or worsen, to please contact office for sooner follow up or seek emergency care.    I spent xxx minutes of dedicated to the care of this patient on the date of this encounter to include pre-visit review of records, face-to-face time with the patient discussing conditions above, post visit ordering of testing, clinical documentation with the electronic health record, making appropriate referrals as documented, and communicating necessary findings to members of the patients care team.   C. Leita Sanders, MD Advanced Bronchoscopy PCCM [facility]    *This note was dictated using voice recognition software/Dragon.  Despite best efforts to proofread, errors can occur which can change the meaning. Any transcriptional errors that result from this process are unintentional and may not be fully corrected at the time of dictation.

## 2024-09-07 ENCOUNTER — Encounter

## 2024-09-08 ENCOUNTER — Ambulatory Visit
Admission: RE | Admit: 2024-09-08 | Discharge: 2024-09-08 | Disposition: A | Discharge status: Home / Self Care | Attending: Pulmonary Disease | Admitting: Pulmonary Disease

## 2024-09-08 ENCOUNTER — Other Ambulatory Visit
Admission: RE | Admit: 2024-09-08 | Discharge: 2024-09-08 | Disposition: A | Discharge status: Home / Self Care | Source: Home / Self Care | Attending: Pulmonary Disease | Admitting: Pulmonary Disease

## 2024-09-08 ENCOUNTER — Ambulatory Visit
Admission: RE | Admit: 2024-09-08 | Discharge: 2024-09-08 | Disposition: A | Discharge status: Home / Self Care | Source: Ambulatory Visit | Attending: Pulmonary Disease | Admitting: Pulmonary Disease

## 2024-09-08 DIAGNOSIS — R918 Other nonspecific abnormal finding of lung field: Secondary | ICD-10-CM | POA: Diagnosis not present

## 2024-09-08 DIAGNOSIS — R059 Cough, unspecified: Secondary | ICD-10-CM | POA: Diagnosis not present

## 2024-09-08 DIAGNOSIS — J4550 Severe persistent asthma, uncomplicated: Secondary | ICD-10-CM | POA: Insufficient documentation

## 2024-09-08 DIAGNOSIS — R053 Chronic cough: Secondary | ICD-10-CM

## 2024-09-08 DIAGNOSIS — R0602 Shortness of breath: Secondary | ICD-10-CM | POA: Diagnosis not present

## 2024-09-08 LAB — CBC WITH DIFFERENTIAL/PLATELET
Abs Immature Granulocytes: 0.03 K/uL (ref 0.00–0.07)
Basophils Absolute: 0 K/uL (ref 0.0–0.1)
Basophils Relative: 0 %
Eosinophils Absolute: 0.4 K/uL (ref 0.0–0.5)
Eosinophils Relative: 5 %
HCT: 32.3 % — ABNORMAL LOW (ref 36.0–46.0)
Hemoglobin: 10.3 g/dL — ABNORMAL LOW (ref 12.0–15.0)
Immature Granulocytes: 0 %
Lymphocytes Relative: 29 %
Lymphs Abs: 2.1 K/uL (ref 0.7–4.0)
MCH: 26.1 pg (ref 26.0–34.0)
MCHC: 31.9 g/dL (ref 30.0–36.0)
MCV: 81.8 fL (ref 80.0–100.0)
Monocytes Absolute: 0.7 K/uL (ref 0.1–1.0)
Monocytes Relative: 9 %
Neutro Abs: 4.1 K/uL (ref 1.7–7.7)
Neutrophils Relative %: 57 %
Platelets: 295 K/uL (ref 150–400)
RBC: 3.95 MIL/uL (ref 3.87–5.11)
RDW: 17.9 % — ABNORMAL HIGH (ref 11.5–15.5)
WBC: 7.3 K/uL (ref 4.0–10.5)
nRBC: 0 % (ref 0.0–0.2)

## 2024-09-11 LAB — ALLERGEN PANEL (27) + IGE
Alternaria Alternata IgE: <0.1 kU/L
Aspergillus Fumigatus IgE: <0.1 kU/L
Bahia Grass IgE: 6.51 kU/L — AB
Bermuda Grass IgE: 2.95 kU/L — AB
Cat Dander IgE: 0.68 kU/L — AB
Cedar, Mountain IgE: 1.38 kU/L — AB
Cladosporium Herbarum IgE: <0.1 kU/L
Cocklebur IgE: 0.77 kU/L — AB
Cockroach, American IgE: 0.3 kU/L — AB
Common Silver Birch IgE: 5 kU/L — AB
D Farinae IgE: 22.9 kU/L — AB
D Pteronyssinus IgE: 16.2 kU/L — AB
Dog Dander IgE: 0.69 kU/L — AB
Elm, American IgE: 2.49 kU/L — AB
Hickory, White IgE: 2.87 kU/L — AB
IgE (Immunoglobulin E), Serum: 2013 [IU]/mL — ABNORMAL HIGH (ref 6–495)
Johnson Grass IgE: 4.77 kU/L — AB
Kentucky Bluegrass IgE: 13.3 kU/L — AB
Maple/Box Elder IgE: 0.67 kU/L — AB
Mucor Racemosus IgE: <0.1 kU/L
Oak, White IgE: 3.83 kU/L — AB
Penicillium Chrysogen IgE: <0.1 kU/L
Pigweed, Rough IgE: 0.7 kU/L — AB
Plantain, English IgE: 0.7 kU/L — AB
Ragweed, Short IgE: 0.59 kU/L — AB
Setomelanomma Rostrat: <0.1 kU/L
Timothy Grass IgE: 5.83 kU/L — AB
White Mulberry IgE: 0.17 kU/L — AB

## 2024-09-14 ENCOUNTER — Ambulatory Visit: Payer: Self-pay | Admitting: Pulmonary Disease

## 2024-09-14 ENCOUNTER — Ambulatory Visit (INDEPENDENT_AMBULATORY_CARE_PROVIDER_SITE_OTHER): Admitting: Pulmonary Disease

## 2024-09-14 DIAGNOSIS — J4550 Severe persistent asthma, uncomplicated: Secondary | ICD-10-CM

## 2024-09-14 DIAGNOSIS — J3089 Other allergic rhinitis: Secondary | ICD-10-CM

## 2024-09-14 DIAGNOSIS — R053 Chronic cough: Secondary | ICD-10-CM

## 2024-09-14 DIAGNOSIS — R0602 Shortness of breath: Secondary | ICD-10-CM

## 2024-09-14 DIAGNOSIS — H1010 Acute atopic conjunctivitis, unspecified eye: Secondary | ICD-10-CM

## 2024-09-14 LAB — PULMONARY FUNCTION TEST
FEF 25-75 Post: 2.67 L/s
FEF 25-75 Pre: 2.86 L/s
FEF2575-%Change-Post: -6 %
FEF2575-%Pred-Post: 106 %
FEF2575-%Pred-Pre: 114 %
FEV1-%Change-Post: -2 %
FEV1-%Pred-Post: 76 %
FEV1-%Pred-Pre: 78 %
FEV1-Post: 1.99 L
FEV1-Pre: 2.04 L
FEV1FVC-%Change-Post: 3 %
FEV1FVC-%Pred-Pre: 115 %
FEV6-%Change-Post: -6 %
FEV6-%Pred-Post: 65 %
FEV6-%Pred-Pre: 69 %
FEV6-Post: 2.11 L
FEV6-Pre: 2.24 L
FEV6FVC-%Pred-Post: 103 %
FEV6FVC-%Pred-Pre: 103 %
FVC-%Change-Post: -6 %
FVC-%Pred-Post: 63 %
FVC-%Pred-Pre: 67 %
FVC-Post: 2.11 L
FVC-Pre: 2.24 L
Post FEV1/FVC ratio: 94 %
Post FEV6/FVC ratio: 100 %
Pre FEV1/FVC ratio: 91 %
Pre FEV6/FVC Ratio: 100 %
RV % pred: 101 %
RV: 1.87 L
TLC % pred: 83 %
TLC: 4.09 L

## 2024-09-14 NOTE — Progress Notes (Signed)
 Full PFT completed today ? ?

## 2024-09-14 NOTE — Telephone Encounter (Signed)
 Referral has been placed.

## 2024-09-14 NOTE — Patient Instructions (Signed)
 Full PFT completed today ? ?

## 2024-09-21 ENCOUNTER — Other Ambulatory Visit (HOSPITAL_COMMUNITY): Payer: Self-pay

## 2024-09-21 DIAGNOSIS — F315 Bipolar disorder, current episode depressed, severe, with psychotic features: Secondary | ICD-10-CM | POA: Diagnosis not present

## 2024-09-21 DIAGNOSIS — F411 Generalized anxiety disorder: Secondary | ICD-10-CM | POA: Diagnosis not present

## 2024-09-21 MED ORDER — HYDROXYZINE PAMOATE 25 MG PO CAPS
25.0000 mg | ORAL_CAPSULE | Freq: Every day | ORAL | 2 refills | Status: AC | PRN
  Filled 2024-09-21: qty 30, 30d supply, fill #0

## 2024-09-21 MED ORDER — MIRTAZAPINE 45 MG PO TABS
45.0000 mg | ORAL_TABLET | Freq: Every day | ORAL | 1 refills | Status: AC
  Filled 2024-09-21: qty 90, 90d supply, fill #0

## 2024-09-29 ENCOUNTER — Encounter: Payer: Self-pay | Admitting: Allergy

## 2024-09-29 ENCOUNTER — Other Ambulatory Visit: Payer: Self-pay

## 2024-09-29 ENCOUNTER — Other Ambulatory Visit (HOSPITAL_COMMUNITY): Payer: Self-pay

## 2024-09-29 ENCOUNTER — Ambulatory Visit (INDEPENDENT_AMBULATORY_CARE_PROVIDER_SITE_OTHER): Admitting: Allergy

## 2024-09-29 VITALS — BP 100/80 | HR 115 | Temp 99.8°F | Ht 62.0 in | Wt 153.3 lb

## 2024-09-29 DIAGNOSIS — J302 Other seasonal allergic rhinitis: Secondary | ICD-10-CM | POA: Diagnosis not present

## 2024-09-29 DIAGNOSIS — J4530 Mild persistent asthma, uncomplicated: Secondary | ICD-10-CM | POA: Diagnosis not present

## 2024-09-29 DIAGNOSIS — J3089 Other allergic rhinitis: Secondary | ICD-10-CM | POA: Diagnosis not present

## 2024-09-29 DIAGNOSIS — T7800XD Anaphylactic reaction due to unspecified food, subsequent encounter: Secondary | ICD-10-CM

## 2024-09-29 MED ORDER — EPINEPHRINE 0.3 MG/0.3ML IJ SOAJ
0.3000 mg | INTRAMUSCULAR | 1 refills | Status: AC | PRN
  Filled 2024-09-29: qty 2, 30d supply, fill #0

## 2024-09-29 MED ORDER — RYALTRIS 665-25 MCG/ACT NA SUSP: NASAL | 3 refills | Status: DC

## 2024-09-29 MED ORDER — FEXOFENADINE HCL 30 MG/5ML PO SUSP
180.0000 mg | Freq: Every day | ORAL | 5 refills | Status: DC
  Filled 2024-09-29 – 2024-10-03 (×2): qty 480, 16d supply, fill #0
  Filled 2024-10-31: qty 472, 15d supply, fill #1
  Filled 2024-11-27: qty 472, 15d supply, fill #2
  Filled 2024-12-19: qty 472, 15d supply, fill #3
  Filled 2025-01-08: qty 472, 15d supply, fill #4
  Filled 2025-01-26 – 2025-01-31 (×2): qty 472, 15d supply, fill #5

## 2024-09-29 NOTE — Progress Notes (Signed)
 New Patient Note  RE: Shelia Ramos MRN: [id number] DOB: Jun 01, 1971 Date of Office Visit: 09/29/2024  Primary care provider: Gayle Saddie FALCON, PA-C  Chief Complaint: allergies  History of present illness: Shelia Ramos is a 53 y.o. female presenting today for evaluation of allergic rhinitis.  She has history of asthma and sees Dr. Tamea with pulmonology who recommended she reestablish care with an allergist for her severe allergies. Discussed the use of AI scribe software for clinical note transcription with the patient, who gave verbal consent to proceed.  She experiences persistent allergy symptoms, including nighttime nasal congestion and frequent sneezing.  Symptoms are year-round.  Not currently using any allergy based medications at this time.  She has used Zyrtec in the past but did not find it to be very effective then.  Previous allergy testing done before 2017 has shown positive results for pollens, cockroach, dust mites, dog, and cat dander, but negative for mold. She was previously allergic to nuts but is unsure of her current status. She consumes pistachios without issue but avoids other nuts, including peanuts, due to past positive allergy testing.  She states she actually does not recall having any reactions to nuts but it was positive on testing in the past and that she stopped eating due to this.  She has been experiencing a cough with wheezing for the past four months, which is being managed with Breyna  inhaler used twice daily and an albuterol  inhaler as needed. She has not yet discontinued Singulair  but plans to discontinue as she was advised to do so by her pulmonologist due to interactions with her antidepressant.  She underwent pulmonary function testing (PFT) a few weeks ago.   No history of eczema.     Review of systems: 10pt ROS negative unless noted above in HPI  Past medical history: Past Medical History:  Diagnosis Date   Allergy    SEASONAL    Anxiety    Asthma    AS A CHILD   Depression    IBS (irritable bowel syndrome)    Seasonal allergies     Past surgical history: Past Surgical History:  Procedure Laterality Date   BUNIONECTOMY Right    x2   COLPOSCOPY      Family history:  Family History  Problem Relation Age of Onset   Allergic rhinitis Mother    Anxiety disorder Mother    Depression Mother    Allergic rhinitis Father    Allergic rhinitis Paternal Aunt    Heart attack Maternal Grandfather    Allergic rhinitis Paternal Grandmother    Cancer Paternal Grandmother        stomach   Angioedema Neg Hx    Asthma Neg Hx    Atopy Neg Hx    Eczema Neg Hx    Immunodeficiency Neg Hx    Urticaria Neg Hx    Pancreatic cancer Neg Hx    Liver disease Neg Hx    Rectal cancer Neg Hx    Colon cancer Neg Hx    Esophageal cancer Neg Hx     Social history: Lives in a home without carpeting with electric heating and central cooling.  No pets in the home.  2 cats outside the home.  No concern for water damage, mildew or roaches in the home.  She works in the front office.  Denies a smoking history.   Medication List: Current Outpatient Medications  Medication Sig Dispense Refill   albuterol  (PROAIR  HFA) 108 (90  Base) MCG/ACT inhaler Inhale 2 puffs into the lungs every 4 (four) hours as needed for wheezing or shortness of breath (excessive cough). 6.7 g 3   benztropine  (COGENTIN ) 0.5 MG tablet Take 1 tablet (0.5 mg total) by mouth 2 (two) times daily. 180 tablet 3   benztropine  (COGENTIN ) 0.5 MG tablet Take 1 tablet (0.5 mg total) by mouth at bedtime. 90 tablet 1   budesonide -formoterol  (BREYNA ) 160-4.5 MCG/ACT inhaler Inhale 2 puffs into the lungs in the morning and at bedtime. 10.2 g 11   estradiol  (VIVELLE -DOT) 0.1 MG/24HR patch Place 1 patch (0.1 mg total) onto the skin 2 (two) times a week. 24 patch 4   hydrOXYzine  (VISTARIL ) 25 MG capsule Take 1 capsule (25 mg total) by mouth 2 (two) times daily as needed. 60  capsule 2   hydrOXYzine  (VISTARIL ) 25 MG capsule Take 1 capsule (25 mg total) by mouth daily as needed for panic. 30 capsule 2   levonorgestrel  (MIRENA , 52 MG,) 20 MCG/DAY IUD Mirena  20 mcg/24 hours (8 yrs) 52 mg intrauterine device  Take 1 device by intrauterine route.     mirtazapine  (REMERON ) 45 MG tablet Take 1 tablet (45 mg total) by mouth at bedtime. 90 tablet 1   risperiDONE  (RISPERDAL ) 3 MG tablet Take 1 tablet (3 mg total) by mouth every evening with dinner. 90 tablet 2   risperiDONE  (RISPERDAL ) 3 MG tablet Take 1 tablet (3 mg total) by mouth at bedtime. 90 tablet 3   risperiDONE  (RISPERDAL ) 3 MG tablet Take 1 tablet (3 mg total) by mouth at bedtime. 90 tablet 1   hyoscyamine  (LEVSIN  SL) 0.125 MG SL tablet Place 1 tablet (0.125 mg) under the tongue at bedtime. (Patient not taking: Reported on 09/29/2024) 30 tablet 3   Hyoscyamine  Sulfate SL 0.125 MG SUBL PLACE 1 TABLET UNDER THE TONGUE AT BEDTIME. (Patient not taking: Reported on 09/29/2024) 30 tablet 11   mirtazapine  (REMERON ) 30 MG tablet Take 0.5-1 tablets (15-30 mg total) by mouth at bedtime. 90 tablet 2   mirtazapine  (REMERON ) 30 MG tablet Take 1 tablet (30 mg total) by mouth at bedtime. 90 tablet 1   omeprazole  (PRILOSEC) 20 MG capsule TAKE 1 CAPSULE BY MOUTH DAILY (Patient not taking: Reported on 09/05/2024) 30 capsule 11   No current facility-administered medications for this visit.    Known medication allergies: No Known Allergies   Physical examination: Blood pressure 100/80, pulse (!) 115, temperature 99.8 F (37.7 C), height 5' 2 (1.575 m), weight 153 lb 4.8 oz (69.5 kg), SpO2 97%.  General: Alert, interactive, in no acute distress. HEENT: PERRLA, TMs pearly gray, turbinates moderately edematous without discharge, post-pharynx non erythematous. Neck: Supple without lymphadenopathy. Lungs: Clear to auscultation without wheezing, rhonchi or rales. {no increased work of breathing. CV: Normal S1, S2 without  murmurs. Abdomen: Nondistended, nontender. Skin: Warm and dry, without lesions or rashes. Extremities:  No clubbing, cyanosis or edema. Neuro:   Grossly intact.  Diagnostics/Labs: See full PFT in EMR  Component     Latest Ref Rng 09/08/2024  IgE (Immunoglobulin E), Serum     6 - 495 IU/mL 2,013 (H)   D Pteronyssinus IgE     Class IV kU/L 16.20 !   D Farinae IgE     Class V kU/L 22.90 !   Cat Dander IgE     Class II kU/L 0.68 !   Dog Dander IgE     Class II kU/L 0.69 !   [place] Grass IgE  Class III kU/L 2.95 !   Timothy Grass IgE     Class IV kU/L 5.83 !   [place]  Bluegrass IgE     Class IV kU/L 13.30 !   [place] Grass IgE     Class IV kU/L 4.77 !   [place] Grass IgE     Class IV kU/L 6.51 !   Cockroach, American IgE     Class 0/I kU/L 0.30 !   Penicillium Chrysogen IgE     Class 0 kU/L <0.10   Cladosporium Herbarum IgE     Class 0 kU/L <0.10   Aspergillus Fumigatus IgE     Class 0 kU/L <0.10   Mucor Racemosus IgE     Class 0 kU/L <0.10   Alternaria Alternata IgE     Class 0 kU/L <0.10   Setomelanomma Rostrat     Class 0 kU/L <0.10   Oak, White IgE     Class III kU/L 3.83 !   Elm, American IgE     Class III kU/L 2.49 !   Maple/Box Elder IgE     Class II kU/L 0.67 !   Common Silver Valrie IgE     Class IV kU/L 5.00 !   Hickory, White IgE     Class III kU/L 2.87 !   White Mulberry IgE     Class 0/I kU/L 0.17 !   [place] IgE     Class II kU/L 1.38 !   Ragweed, Short IgE     Class II kU/L 0.59 !   Plantain, English IgE     Class II kU/L 0.70 !   Cocklebur IgE     Class II kU/L 0.77 !   Pigweed, Rough IgE     Class II kU/L 0.70 !    Component     Latest Ref Rng 09/08/2024  WBC     4.0 - 10.5 K/uL 7.3   RBC     3.87 - 5.11 MIL/uL 3.95   Hemoglobin     12.0 - 15.0 g/dL 89.6 (L)   HCT     63.9 - 46.0 % 32.3 (L)   MCV     80.0 - 100.0 fL 81.8   MCH     26.0 - 34.0 pg 26.1   MCHC     30.0 - 36.0 g/dL 68.0   RDW     88.4 - 84.4 % 17.9  (H)   Platelets     150 - 400 K/uL 295   nRBC     0.0 - 0.2 % 0.0   Neutrophils     % 57   NEUT#     1.7 - 7.7 K/uL 4.1   Lymphocytes     % 29   Lymphs Abs     0.7 - 4.0 K/uL 2.1   Monocytes Relative     % 9   Monocyte #     0.1 - 1.0 K/uL 0.7   Eosinophil     % 5   Eosinophils Absolute     0.0 - 0.5 K/uL 0.4   Basophil     % 0   Basophils Absolute     0.0 - 0.1 K/uL 0.0   Immature Granulocytes     % 0   Abs Immature Granulocytes     0.00 - 0.07 K/uL 0.03     Assessment and plan: Allergic rhinitis and multiple environmental allergies Chronic allergic rhinitis with positive test to pollens, cockroach, dust mites, dog,  and cat dander. Discontinued Singulair  due to interaction with antidepressant. - Prescribed Ryaltris nasal spray for nasal congestion and postnasal drip.  Use 2 sprays each nostril twice a day as needed for runny or stuffy nose control.  With using nasal sprays point tip of bottle toward eye on same side nostril and lean head slightly forward for best technique.   - Prescribed Allegra 180mg  (liquid) as an oral antihistamine.   Asthma Chronic asthma with persistent cough and wheezing managed with Breyna  inhaler and albuterol  as rescue. - Continue Breyna  inhaler twice daily. - Use albuterol  inhaler as needed. - Continue follow-up care and management with Dr Tamea, your pulmonologist  Food allergy to peanuts and tree nuts (except pistachio) Positive previous allergy testing to peanuts and tree nuts.  No recent reactions, will reevaluation. - Order blood test for nut allergies. - If blood test negative or low, plan skin testing on a different day. - If skin testing negative or low, consider supervised food challenge in office. - Prescribe updated EpiPen  for emergency use until allergy status confirmed.  Follow-up in 4-6 months or sooner if needed  I appreciate the opportunity to take part in Kiana's care. Please do not hesitate to contact me with  questions.  Sincerely,   Danita Brain, MD Allergy/Immunology [facility] 

## 2024-09-29 NOTE — Patient Instructions (Signed)
 Allergic rhinitis and multiple environmental allergies Chronic allergic rhinitis with positive test to pollens, cockroach, dust mites, dog, and cat dander. Discontinued Singulair  due to interaction with antidepressant. - Prescribed Ryaltris nasal spray for nasal congestion and postnasal drip.  Use 2 sprays each nostril twice a day as needed for runny or stuffy nose control.  With using nasal sprays point tip of bottle toward eye on same side nostril and lean head slightly forward for best technique.   - Prescribed Allegra 180mg  (liquid) as an oral antihistamine.   Asthma Chronic asthma with persistent cough and wheezing managed with Breyna  inhaler and albuterol  as rescue. - Continue Breyna  inhaler twice daily. - Use albuterol  inhaler as needed. - Continue follow-up care and management with Dr Tamea, your pulmonologist  Food allergy to peanuts and tree nuts (except pistachio) Positive previous allergy testing to peanuts and tree nuts.  No recent reactions, will reevaluation. - Order blood test for nut allergies. - If blood test negative or low, plan skin testing on a different day. - If skin testing negative or low, consider supervised food challenge in office. - Prescribe updated EpiPen  for emergency use until allergy status confirmed.  Follow-up in 4-6 months or sooner if needed

## 2024-10-02 ENCOUNTER — Other Ambulatory Visit (HOSPITAL_COMMUNITY): Payer: Self-pay

## 2024-10-03 ENCOUNTER — Other Ambulatory Visit (HOSPITAL_COMMUNITY): Payer: Self-pay

## 2024-10-03 LAB — IGE NUT PROF. W/COMPONENT RFLX

## 2024-10-04 ENCOUNTER — Ambulatory Visit: Payer: Self-pay | Admitting: Allergy

## 2024-10-04 LAB — IGE NUT PROF. W/COMPONENT RFLX
F017-IgE Hazelnut (Filbert): 7.35 kU/L — AB
F018-IgE Brazil Nut: 3.07 kU/L — AB
F202-IgE Cashew Nut: 0.74 kU/L — AB
F202-IgE Cashew Nut: 2.8 kU/L — AB
F256-IgE Walnut: 6.55 kU/L — AB
Jug R 3 IgE: 0.81 kU/L — AB
Macadamia Nut, IgE: 1.91 kU/L — AB
Peanut, IgE: 8.72 kU/L — AB
Pecan Nut IgE: 0.51 kU/L — AB

## 2024-10-04 LAB — PANEL 604721
Jug R 1 IgE: <0.1 kU/L
Jug R 3 IgE: 6.22 kU/L — AB

## 2024-10-04 LAB — ALLERGEN COMPONENT COMMENTS

## 2024-10-04 LAB — PEANUT COMPONENTS
F352-IgE Ara h 8: 2.61 kU/L — AB
F422-IgE Ara h 1: <0.1 kU/L
F423-IgE Ara h 2: <0.1 kU/L
F424-IgE Ara h 3: 1.25 kU/L — AB
F427-IgE Ara h 9: 34.9 kU/L — AB
F447-IgE Ara h 6: <0.1 kU/L

## 2024-10-04 LAB — PANEL 604726
Cor A 1 IgE: 5.81 kU/L — AB
Cor A 14 IgE: 1.04 kU/L — AB
Cor A 8 IgE: 22.6 kU/L — AB
Cor A 9 IgE: 0.51 kU/L — AB

## 2024-10-04 LAB — PANEL 604239: ANA O 3 IgE: 0.13 kU/L — AB

## 2024-10-04 LAB — PANEL 604350: Ber E 1 IgE: 5.66 kU/L — AB

## 2024-10-13 ENCOUNTER — Other Ambulatory Visit (HOSPITAL_COMMUNITY): Payer: Self-pay

## 2024-10-24 ENCOUNTER — Encounter: Payer: Self-pay | Admitting: Pulmonary Disease

## 2024-10-24 ENCOUNTER — Ambulatory Visit
Admission: RE | Admit: 2024-10-24 | Discharge: 2024-10-24 | Disposition: A | Discharge status: Home / Self Care | Source: Ambulatory Visit | Attending: Pulmonary Disease | Admitting: Pulmonary Disease

## 2024-10-24 ENCOUNTER — Ambulatory Visit: Admitting: Pulmonary Disease

## 2024-10-24 VITALS — BP 100/72 | HR 83 | Temp 98.1°F | Ht 62.0 in | Wt 153.2 lb

## 2024-10-24 DIAGNOSIS — J3089 Other allergic rhinitis: Secondary | ICD-10-CM

## 2024-10-24 DIAGNOSIS — R011 Cardiac murmur, unspecified: Secondary | ICD-10-CM

## 2024-10-24 DIAGNOSIS — F419 Anxiety disorder, unspecified: Secondary | ICD-10-CM | POA: Insufficient documentation

## 2024-10-24 DIAGNOSIS — Z91010 Allergy to peanuts: Secondary | ICD-10-CM | POA: Diagnosis not present

## 2024-10-24 DIAGNOSIS — Z91018 Allergy to other foods: Secondary | ICD-10-CM

## 2024-10-24 DIAGNOSIS — J4550 Severe persistent asthma, uncomplicated: Secondary | ICD-10-CM | POA: Diagnosis not present

## 2024-10-24 DIAGNOSIS — R0602 Shortness of breath: Secondary | ICD-10-CM | POA: Insufficient documentation

## 2024-10-24 DIAGNOSIS — F32A Depression, unspecified: Secondary | ICD-10-CM | POA: Insufficient documentation

## 2024-10-24 LAB — ECHOCARDIOGRAM COMPLETE
AR max vel: 2.07 cm2
AV Area VTI: 1.95 cm2
AV Area mean vel: 2.01 cm2
AV Mean grad: 3 mmHg
AV Peak grad: 6.1 mmHg
Ao pk vel: 1.24 m/s
Area-P 1/2: 4.57 cm2
MV VTI: 2.97 cm2
S' Lateral: 2.3 cm

## 2024-10-24 LAB — NITRIC OXIDE: Nitric Oxide: 78

## 2024-10-24 NOTE — Progress Notes (Signed)
 Subjective:    Patient ID: Shelia Ramos, female    DOB: [birth year], 53 y.o.   MRN: [id number]  Patient Care Team: Gayle Saddie JULIANNA DEVONNA as PCP - General (Physician Assistant) Shila Gustav GAILS, MD as Consulting Physician (Gastroenterology) Stover, Titorya, DPM as Consulting Physician (Podiatry) Sarrah Browning, MD as Consulting Physician (Obstetrics and Gynecology) Jeneal Danita Macintosh, MD as Consulting Physician (Allergy) Noralyn Flock, MD as Referring Physician (Psychiatry)  Chief Complaint  Patient presents with   Asthma    Occasional cough.     BACKGROUND/INTERVAL:Patient is a 53 year old lifelong never smoker who presents for follow-up of cough and shortness of breath.  She was initially seen on 05 September 2024.  She has severe persistent asthma.  HPI Discussed the use of AI scribe software for clinical note transcription with the patient, who gave verbal consent to proceed.  History of Present Illness   Shelia Ramos is a 53 year old female with asthma and nut allergies who presents for follow-up of airway inflammation.  She is able to engage in some physical activity. She is using her inhaler as prescribed and reports that it has been perfect.  Her cough has responded well to inhaler therapy and she no longer is plagued by this symptom.  She has been tested by Allergy and Immunology and has been noted to have an allergy to nuts, including peanuts, hazelnuts, [place] nuts, and almonds, with peanuts being particularly high on her allergy panel.  She had moderate to low levels to cashew, pecan and pistachio.  She needs to avoid all nuts.  She avoids foods cooked in peanut oil or containing nuts, especially when ordering food at work.   She had an echocardiogram today due to a cardiac murmur detected during her initial evaluation.  The interpretation of the study is not back yet.    DATA 06/29/2005 CXR PA and lateral: Hyperinflation and chronic bronchitic  changes 10/29/2023 eosinophils: 700 cells/uL 07/21/2024 eosinophils: 500 cells/uL 09/02/2024 nitric oxide : 123 ppb 09/05/2024 nitric oxide : 117 ppb 10/24/2024 nitric oxide : 78 ppb  Review of Systems A 10 point review of systems was performed and it is as noted above otherwise negative.   Patient Active Problem List   Diagnosis Date Noted   Elevated serum creatinine 07/18/2024   Vitamin D  deficiency 05/30/2019   Environmental and seasonal allergies 05/30/2019   Mixed hyperlipidemia 08/25/2018   Bipolar disorder, curr episode depressed, severe, w/psychotic features (HCC) 05/03/2017   Bunion of great toe of left foot 10/09/2016   IBS (irritable bowel syndrome) 10/09/2016   Perimenopause- on Xulane 10/09/2016   Insomnia disorder 10/09/2016   Moderate persistent asthma-  09/28/2016   Allergic rhinoconjunctivitis 09/28/2016    Social History   Tobacco Use   Smoking status: Never    Passive exposure: Never   Smokeless tobacco: Never  Substance Use Topics   Alcohol use: No    Alcohol/week: 0.0 standard drinks of alcohol    No Known Allergies  Current Meds  Medication Sig   albuterol  (PROAIR  HFA) 108 (90 Base) MCG/ACT inhaler Inhale 2 puffs into the lungs every 4 (four) hours as needed for wheezing or shortness of breath (excessive cough).   benztropine  (COGENTIN ) 0.5 MG tablet Take 1 tablet (0.5 mg total) by mouth at bedtime.   budesonide -formoterol  (BREYNA ) 160-4.5 MCG/ACT inhaler Inhale 2 puffs into the lungs in the morning and at bedtime.   EPINEPHrine  (EPIPEN  2-PAK) 0.3 mg/0.3 mL IJ SOAJ injection Inject 0.3 mg into the muscle as  needed for anaphylaxis.   estradiol  (VIVELLE -DOT) 0.1 MG/24HR patch Place 1 patch (0.1 mg total) onto the skin 2 (two) times a week.   fexofenadine (ALLEGRA) 30 MG/5ML suspension Take 30 mLs (180 mg total) by mouth daily.   hydrOXYzine  (VISTARIL ) 25 MG capsule Take 1 capsule (25 mg total) by mouth daily as needed for panic.   hyoscyamine  (LEVSIN   SL) 0.125 MG SL tablet Place 1 tablet (0.125 mg) under the tongue at bedtime.   Hyoscyamine  Sulfate SL 0.125 MG SUBL PLACE 1 TABLET UNDER THE TONGUE AT BEDTIME.   levonorgestrel  (MIRENA , 52 MG,) 20 MCG/DAY IUD Mirena  20 mcg/24 hours (8 yrs) 52 mg intrauterine device  Take 1 device by intrauterine route.   mirtazapine  (REMERON ) 45 MG tablet Take 1 tablet (45 mg total) by mouth at bedtime.   Olopatadine-Mometasone (RYALTRIS) 665-25 MCG/ACT SUSP 2 sprays each nostril twice a day as needed for runny or stuffy nose control.   omeprazole  (PRILOSEC) 20 MG capsule TAKE 1 CAPSULE BY MOUTH DAILY   risperiDONE  (RISPERDAL ) 3 MG tablet Take 1 tablet (3 mg total) by mouth at bedtime.    Immunization History  Administered Date(s) Administered   Influenza,inj,Quad PF,6+ Mos 10/09/2016, 09/18/2021, 10/16/2024   Influenza-Unspecified 09/27/2016, 10/13/2023   PFIZER(Purple Top)SARS-COV-2 Vaccination 07/01/2021, 07/22/2021   Tdap 08/25/2018   Zoster Recombinant(Shingrix) 09/18/2021, 03/19/2022      Objective:   BP 100/72   Pulse 83   Temp 98.1 F (36.7 C) (Temporal)   Ht 5' 2 (1.575 m)   Wt 153 lb 3.2 oz (69.5 kg)   SpO2 97%   BMI 28.02 kg/m   SpO2: 97 %  GENERAL: Well-developed, well-nourished woman, no acute distress. HEAD: Normocephalic, atraumatic.  EYES: Pupils equal, round, reactive to light.  No scleral icterus.  MOUTH: Dentition intact, oral mucosa moist.  No thrush. NECK: Supple. No thyromegaly. Trachea midline. No JVD.  No adenopathy. PULMONARY: Good air entry bilaterally.  No adventitious sounds noted today. CARDIOVASCULAR: S1 and S2. Regular rate and rhythm.  Grade 2/6 systolic ejection murmur left sternal border. ABDOMEN: Benign. MUSCULOSKELETAL: No joint deformity, no clubbing, no edema.  NEUROLOGIC: No overt focal deficit, no gait disturbance, speech is fluent. SKIN: Intact,warm,dry. PSYCH: Mood and behavior normal.  Lab Results  Component Value Date   NITRICOXIDE 78  10/24/2024  *High level type II inflammation present, albeit, decreasing from prior level performed in September. *Trend:123 >> 117 >> 78 ppb  Echocardiogram performed today however it is pending.   Assessment & Plan:     ICD-10-CM   1. Severe persistent asthma, poorly-controlled, uncomplicated (HCC)  J45.50 Nitric oxide     2. Cardiac murmur  R01.1     3. Environmental and seasonal allergies  J30.89     4. Nut allergy  Z91.018    Peanut and tree nut allergy      Orders Placed This Encounter  Procedures   Nitric oxide    Discussion:    Severe persistent asthma Asthma is improving with reduced airway inflammation. - Monitor airway inflammation levels periodically. - Consider allergy immunotherapy or biologic therapy if inflammation persists. - Schedule follow-up appointment in three months.  Nut allergy (peanut and tree nuts) Confirmed lifelong allergy to peanuts and tree nuts, including hazelnuts, Brazil nuts, and almonds. Peanut allergy is particularly severe. - Ensure all food items are checked for nut content, especially during events or when ordering food.  - Per Allergy and Immunology patient to have EpiPen  available, agree with recommendation.  Cardiac murmur Patient had  echocardiogram today, pending. - Will notify patient of results of echo when these are available     Advised if symptoms do not improve or worsen, to please contact office for sooner follow up or seek emergency care.    I spent 30 minutes of dedicated to the care of this patient on the date of this encounter to include pre-visit review of records, face-to-face time with the patient discussing conditions above, post visit ordering of testing, clinical documentation with the electronic health record, making appropriate referrals as documented, and communicating necessary findings to members of the patients care team.     C. Leita Sanders, MD Advanced Bronchoscopy PCCM [place]  [facility]    *This note was generated using voice recognition software/Dragon and/or AI transcription program.  Despite best efforts to proofread, errors can occur which can change the meaning. Any transcriptional errors that result from this process are unintentional and may not be fully corrected at the time of dictation.

## 2024-10-24 NOTE — Progress Notes (Signed)
*  PRELIMINARY RESULTS* Echocardiogram 2D Echocardiogram has been performed.  Shelia Ramos 10/24/2024, 9:23 AM

## 2024-10-24 NOTE — Patient Instructions (Signed)
 VISIT SUMMARY:  Shelia Ramos, you had a follow-up appointment today to check on your asthma and nut allergies. You reported that your inhaler is working well and you are avoiding nuts as advised.  YOUR PLAN:  -SEVERE PERSISTENT ASTHMA: Severe persistent asthma is a long-term condition where the airways in your lungs are inflamed and narrowed, making it hard to breathe. Your asthma is improving with reduced airway inflammation. We will monitor your airway inflammation levels periodically and consider allergy immunotherapy if the inflammation persists. Please schedule a follow-up appointment in three months.  -NUT ALLERGY (PEANUT AND TREE NUTS): A nut allergy is a lifelong condition where your immune system reacts to proteins in nuts, causing allergic reactions. You have a confirmed allergy to peanuts and tree nuts, including hazelnuts, Brazil nuts, and almonds, with a particularly severe reaction to peanuts. Continue to ensure all food items are checked for nut content, especially during events or when ordering food.  INSTRUCTIONS:  Please schedule a follow-up appointment in three months to monitor your asthma and discuss any further treatment options if needed.

## 2024-10-31 ENCOUNTER — Other Ambulatory Visit (HOSPITAL_COMMUNITY): Payer: Self-pay

## 2024-11-01 ENCOUNTER — Other Ambulatory Visit (HOSPITAL_COMMUNITY): Payer: Self-pay

## 2024-11-14 ENCOUNTER — Other Ambulatory Visit (HOSPITAL_COMMUNITY): Payer: Self-pay

## 2024-11-27 ENCOUNTER — Other Ambulatory Visit (HOSPITAL_COMMUNITY): Payer: Self-pay

## 2024-11-29 ENCOUNTER — Other Ambulatory Visit (HOSPITAL_COMMUNITY): Payer: Self-pay

## 2024-11-30 ENCOUNTER — Other Ambulatory Visit (HOSPITAL_COMMUNITY): Payer: Self-pay

## 2024-11-30 DIAGNOSIS — F315 Bipolar disorder, current episode depressed, severe, with psychotic features: Secondary | ICD-10-CM | POA: Diagnosis not present

## 2024-11-30 DIAGNOSIS — F411 Generalized anxiety disorder: Secondary | ICD-10-CM | POA: Diagnosis not present

## 2024-11-30 MED ORDER — RISPERIDONE 3 MG PO TABS
3.0000 mg | ORAL_TABLET | Freq: Every day | ORAL | 1 refills | Status: AC
  Filled 2024-11-30: qty 90, 90d supply, fill #0

## 2024-11-30 MED ORDER — BENZTROPINE MESYLATE 0.5 MG PO TABS
0.5000 mg | ORAL_TABLET | Freq: Every evening | ORAL | 1 refills | Status: AC
  Filled 2024-11-30: qty 90, 90d supply, fill #0

## 2024-12-18 ENCOUNTER — Other Ambulatory Visit: Payer: Self-pay

## 2024-12-18 ENCOUNTER — Other Ambulatory Visit (HOSPITAL_COMMUNITY): Payer: Self-pay

## 2024-12-18 ENCOUNTER — Encounter: Payer: Self-pay | Admitting: Dermatology

## 2024-12-18 ENCOUNTER — Ambulatory Visit: Admitting: Dermatology

## 2024-12-18 DIAGNOSIS — L814 Other melanin hyperpigmentation: Secondary | ICD-10-CM | POA: Diagnosis not present

## 2024-12-18 DIAGNOSIS — C44519 Basal cell carcinoma of skin of other part of trunk: Secondary | ICD-10-CM | POA: Diagnosis not present

## 2024-12-18 DIAGNOSIS — L578 Other skin changes due to chronic exposure to nonionizing radiation: Secondary | ICD-10-CM | POA: Diagnosis not present

## 2024-12-18 DIAGNOSIS — W908XXA Exposure to other nonionizing radiation, initial encounter: Secondary | ICD-10-CM | POA: Diagnosis not present

## 2024-12-18 DIAGNOSIS — D1801 Hemangioma of skin and subcutaneous tissue: Secondary | ICD-10-CM | POA: Diagnosis not present

## 2024-12-18 DIAGNOSIS — D229 Melanocytic nevi, unspecified: Secondary | ICD-10-CM

## 2024-12-18 DIAGNOSIS — Z1283 Encounter for screening for malignant neoplasm of skin: Secondary | ICD-10-CM | POA: Diagnosis not present

## 2024-12-18 DIAGNOSIS — L219 Seborrheic dermatitis, unspecified: Secondary | ICD-10-CM | POA: Diagnosis not present

## 2024-12-18 DIAGNOSIS — L821 Other seborrheic keratosis: Secondary | ICD-10-CM

## 2024-12-18 DIAGNOSIS — D489 Neoplasm of uncertain behavior, unspecified: Secondary | ICD-10-CM | POA: Diagnosis not present

## 2024-12-18 DIAGNOSIS — C4491 Basal cell carcinoma of skin, unspecified: Secondary | ICD-10-CM

## 2024-12-18 HISTORY — DX: Basal cell carcinoma of skin, unspecified: C44.91

## 2024-12-18 MED ORDER — KETOCONAZOLE 2 % EX SHAM
MEDICATED_SHAMPOO | CUTANEOUS | 5 refills | Status: AC
  Filled 2024-12-18: qty 120, 30d supply, fill #0

## 2024-12-18 NOTE — Patient Instructions (Addendum)
 - Start ketoconazole  shampoo 2% at least twice weekly lather into hair and leave for 5-10 minutes before washing out  Wound Care Instructions  Cleanse wound gently with soap and water once a day then pat dry with clean gauze. Apply a thin coat of Petrolatum (petroleum jelly, Vaseline) over the wound (unless you have an allergy to this). We recommend that you use a new, sterile tube of Vaseline. Do not pick or remove scabs. Do not remove the yellow or white healing tissue from the base of the wound.  Cover the wound with fresh, clean, nonstick gauze and secure with paper tape. You may use Band-Aids in place of gauze and tape if the wound is small enough, but would recommend trimming much of the tape off as there is often too much. Sometimes Band-Aids can irritate the skin.  You should call the office for your biopsy report after 1 week if you have not already been contacted.  If you experience any problems, such as abnormal amounts of bleeding, swelling, significant bruising, significant pain, or evidence of infection, please call the office immediately.  FOR ADULT SURGERY PATIENTS: If you need something for pain relief you may take 1 extra strength Tylenol  (acetaminophen ) AND 2 Ibuprofen  (200mg  each) together every 4 hours as needed for pain. (do not take these if you are allergic to them or if you have a reason you should not take them.) Typically, you may only need pain medication for 1 to 3 days.     Recommend daily broad spectrum sunscreen SPF 30+ to sun-exposed areas, reapply every 2 hours as needed. Call for new or changing lesions.  Staying in the shade or wearing long sleeves, sun glasses (UVA+UVB protection) and wide brim hats (4-inch brim around the entire circumference of the hat) are also recommended for sun protection.   Melanoma ABCDEs  Melanoma is the most dangerous type of skin cancer, and is the leading cause of death from skin disease.  You are more likely to develop  melanoma if you: Have light-colored skin, light-colored eyes, or red or blond hair Spend a lot of time in the sun Tan regularly, either outdoors or in a tanning bed Have had blistering sunburns, especially during childhood Have a close family member who has had a melanoma Have atypical moles or large birthmarks  Early detection of melanoma is key since treatment is typically straightforward and cure rates are extremely high if we catch it early.   The first sign of melanoma is often a change in a mole or a new dark spot.  The ABCDE system is a way of remembering the signs of melanoma.  A for asymmetry:  The two halves do not match. B for border:  The edges of the growth are irregular. C for color:  A mixture of colors are present instead of an even brown color. D for diameter:  Melanomas are usually (but not always) greater than 6mm - the size of a pencil eraser. E for evolution:  The spot keeps changing in size, shape, and color.  Please check your skin once per month between visits. You can use a small mirror in front and a large mirror behind you to keep an eye on the back side or your body.   If you see any new or changing lesions before your next follow-up, please call to schedule a visit.  Please continue daily skin protection including broad spectrum sunscreen SPF 30+ to sun-exposed areas, reapplying every 2 hours as needed  when you're outdoors.    Due to recent changes in healthcare laws, you may see results of your pathology and/or laboratory studies on MyChart before the doctors have had a chance to review them. We understand that in some cases there may be results that are confusing or concerning to you. Please understand that not all results are received at the same time and often the doctors may need to interpret multiple results in order to provide you with the best plan of care or course of treatment. Therefore, we ask that you please give us  2 business days to thoroughly  review all your results before contacting the office for clarification. Should we see a critical lab result, you will be contacted sooner.   If You Need Anything After Your Visit  If you have any questions or concerns for your doctor, please call our main line at [phone] and press option 4 to reach your doctor's medical assistant. If no one answers, please leave a voicemail as directed and we will return your call as soon as possible. Messages left after 4 pm will be answered the following business day.   You may also send us  a message via MyChart. We typically respond to MyChart messages within 1-2 business days.  For prescription refills, please ask your pharmacy to contact our office. Our fax number is [phone].  If you have an urgent issue when the clinic is closed that cannot wait until the next business day, you can page your doctor at the number below.    Please note that while we do our best to be available for urgent issues outside of office hours, we are not available 24/7.   If you have an urgent issue and are unable to reach us , you may choose to seek medical care at your doctor's office, retail clinic, urgent care center, or emergency room.  If you have a medical emergency, please immediately call 911 or go to the emergency department.  Pager Numbers  - Dr. Hester: [phone]  - Dr. Jackquline: [phone]  - Dr. Claudene: [phone]   - Dr. Raymund: [phone]  In the event of inclement weather, please call our main line at [phone] for an update on the status of any delays or closures.  Dermatology Medication Tips: Please keep the boxes that topical medications come in in order to help keep track of the instructions about where and how to use these. Pharmacies typically print the medication instructions only on the boxes and not directly on the medication tubes.   If your medication is too expensive, please contact our office at [phone] option 4 or  send us  a message through MyChart.   We are unable to tell what your co-pay for medications will be in advance as this is different depending on your insurance coverage. However, we may be able to find a substitute medication at lower cost or fill out paperwork to get insurance to cover a needed medication.   If a prior authorization is required to get your medication covered by your insurance company, please allow us  1-2 business days to complete this process.  Drug prices often vary depending on where the prescription is filled and some pharmacies may offer cheaper prices.  The website www.goodrx.com contains coupons for medications through different pharmacies. The prices here do not account for what the cost may be with help from insurance (it may be cheaper with your insurance), but the website can give you the price if you did not use any  insurance.  - You can print the associated coupon and take it with your prescription to the pharmacy.  - You may also stop by our office during regular business hours and pick up a GoodRx coupon card.  - If you need your prescription sent electronically to a different pharmacy, notify our office through [facility] or by phone at [phone] option 4.     Si Usted Necesita Algo Despus de Su Visita  Tambin puede enviarnos un mensaje a travs de Clinical Cytogeneticist. Por lo general respondemos a los mensajes de MyChart en el transcurso de 1 a 2 das hbiles.  Para renovar recetas, por favor pida a su farmacia que se ponga en contacto con nuestra oficina. Randi lakes de fax es [place] [phone].  Si tiene un asunto urgente cuando la clnica est cerrada y que no puede esperar hasta el siguiente da hbil, puede llamar/localizar a su doctor(a) al nmero que aparece a continuacin.   Por favor, tenga en cuenta que aunque hacemos todo lo posible para estar disponibles para asuntos urgentes fuera del horario de [place], no estamos disponibles las 24 horas del  da, los 7 [address]  [address] de la [place].   Si tiene un problema urgente y no puede comunicarse con nosotros, puede optar por buscar atencin mdica  en el consultorio de su doctor(a), en una clnica privada, en un centro de atencin urgente o en una sala de emergencias.  Si tiene engineer, drilling, por favor llame inmediatamente al 911 o vaya a la sala de emergencias.  Nmeros de bper  - Dr. Hester: [phone]  - Dra. Jackquline: [phone]  - Dr. Claudene: [phone]  - Dra. Kitts: [phone]  En caso de inclemencias del [place], por favor llame a nuestra lnea principal al [phone] para una actualizacin sobre el estado de cualquier retraso o cierre.  Consejos para la medicacin en dermatologa: Por favor, guarde las cajas en las que vienen los medicamentos de uso tpico para ayudarle a seguir las instrucciones sobre dnde y cmo usarlos. Las farmacias generalmente imprimen las instrucciones del medicamento slo en las cajas y no directamente en los tubos del [place].   Si su medicamento es muy caro, por favor, pngase en contacto con landry rieger llamando al [phone] y presione la opcin 4 o envenos un mensaje a travs de Clinical Cytogeneticist.   No podemos decirle cul ser su copago por los medicamentos por adelantado ya que esto es diferente dependiendo de la cobertura de su seguro. Sin embargo, es posible que podamos encontrar un medicamento sustituto a audiological scientist un formulario para que el seguro cubra el medicamento que se considera necesario.   Si se requiere una autorizacin previa para que su compaa de seguros cubra su medicamento, por favor permtanos de 1 a 2 das hbiles para completar este proceso.  Los precios de los medicamentos varan con frecuencia dependiendo del environmental consultant de dnde se surte la receta y alguna farmacias pueden ofrecer precios ms baratos.  El sitio web www.goodrx.com tiene cupones para medicamentos de health and safety inspector. Los precios aqu no  tienen en cuenta lo que podra costar con la ayuda del seguro (puede ser ms barato con su seguro), pero el sitio web puede darle el precio si no utiliz tourist information centre manager.  - Puede imprimir el cupn correspondiente y llevarlo con su receta a la farmacia.  - Tambin puede pasar por nuestra oficina durante el horario de atencin regular y education officer, museum una tarjeta de cupones de GoodRx.  - Si necesita que su receta  se enve electrnicamente a una farmacia diferente, informe a nuestra oficina a travs de MyChart de [facility] o por telfono llamando al [phone] y presione la opcin 4.

## 2024-12-18 NOTE — Progress Notes (Signed)
 "  New Patient Visit   Subjective  Shelia Ramos is a 53 y.o. female who presents for the following: Skin Cancer Screening and Full Body Skin Exam; no personal or family hx of skin cancer. Patient reports area of concern on her left temple as well as the left inframammary area.   The patient presents for Total-Body Skin Exam (TBSE) for skin cancer screening and mole check. The patient has spots, moles and lesions to be evaluated, some may be new or changing and the patient may have concern these could be cancer.    The following portions of the chart were reviewed this encounter and updated as appropriate: medications, allergies, medical history  Review of Systems:  No other skin or systemic complaints except as noted in HPI or Assessment and Plan.  Objective  Well appearing patient in no apparent distress; mood and affect are within normal limits.  A full examination was performed including scalp, head, eyes, ears, nose, lips, neck, chest, axillae, abdomen, back, buttocks, bilateral upper extremities, bilateral lower extremities, hands, fingers, fingernails. All findings within normal limits unless otherwise noted below. Declines exam of feet  Relevant physical exam findings are noted in the Assessment and Plan.  Left parasternal chest 7mm pink thin plaque    Assessment & Plan   SKIN CANCER SCREENING PERFORMED TODAY.  ACTINIC DAMAGE - Chronic condition, secondary to cumulative UV/sun exposure - diffuse scaly erythematous macules with underlying dyspigmentation - Recommend daily broad spectrum sunscreen SPF 30+ to sun-exposed areas, reapply every 2 hours as needed.  - Staying in the shade or wearing long sleeves, sun glasses (UVA+UVB protection) and wide brim hats (4-inch brim around the entire circumference of the hat) are also recommended for sun protection.  - Call for new or changing lesions.  LENTIGINES, SEBORRHEIC KERATOSES at the left temple and mid inframammary,  HEMANGIOMAS - Benign normal skin lesions - Benign-appearing - Call for any changes  MELANOCYTIC NEVI - Tan-brown and/or pink-flesh-colored symmetric macules and papules - Benign appearing on exam today - Observation - Call clinic for new or changing moles - Recommend daily use of broad spectrum spf 30+ sunscreen to sun-exposed areas.   SEBORRHEIC DERMATITIS Exam: scattered scaly patches at the scalp  Chronic and persistent condition with duration or expected duration over one year. Condition is symptomatic/ bothersome to patient. Not currently at goal.   Seborrheic Dermatitis is a chronic persistent rash characterized by pinkness and scaling most commonly of the mid face but also can occur on the scalp (dandruff), ears; mid chest, mid back and groin.  It tends to be exacerbated by stress and cooler weather.  People who have neurologic disease may experience new onset or exacerbation of existing seborrheic dermatitis.  The condition is not curable but treatable and can be controlled.  Treatment Plan: - Start ketoconazole  shampoo 2% at least twice weekly lather into hair and leave for 5-10 minutes before washing out   NEOPLASM OF UNCERTAIN BEHAVIOR Left parasternal chest - Skin / nail biopsy Type of biopsy: tangential   Informed consent: discussed and consent obtained   Timeout: patient name, date of birth, surgical site, and procedure verified   Procedure prep:  Patient was prepped and draped in usual sterile fashion Prep type:  Isopropyl alcohol Anesthesia: the lesion was anesthetized in a standard fashion   Anesthetic:  1% lidocaine  w/ epinephrine  1-100,000 buffered w/ 8.4% NaHCO3 Instrument used: DermaBlade   Hemostasis achieved with: pressure and aluminum chloride   Outcome: patient tolerated procedure  well   Post-procedure details: sterile dressing applied and wound care instructions given   Dressing type: bandage and petrolatum    Specimen 1 - Surgical  pathology Differential Diagnosis: BCC vs Other   Check Margins: No SEBORRHEIC DERMATITIS   MULTIPLE BENIGN NEVI   LENTIGINES   ACTINIC ELASTOSIS   SEBORRHEIC KERATOSES   CHERRY ANGIOMA    Return in about 1 year (around 12/18/2025) for TBSE.  I, Emerick Ege, CMA am acting as scribe for Boneta Sharps, MD.   Documentation: I have reviewed the above documentation for accuracy and completeness, and I agree with the above.  Boneta Sharps, MD    "

## 2024-12-20 ENCOUNTER — Other Ambulatory Visit (HOSPITAL_COMMUNITY): Payer: Self-pay

## 2024-12-20 LAB — SURGICAL PATHOLOGY

## 2024-12-22 ENCOUNTER — Other Ambulatory Visit (HOSPITAL_COMMUNITY): Payer: Self-pay

## 2024-12-25 ENCOUNTER — Ambulatory Visit: Payer: Self-pay | Admitting: Dermatology

## 2024-12-26 ENCOUNTER — Encounter: Payer: Self-pay | Admitting: Dermatology

## 2024-12-26 NOTE — Telephone Encounter (Signed)
-----   Message from Boneta Sharps, MD sent at 12/25/2024 10:21 PM EST ----- left parasternal chest :       BASAL CELL CARCINOMA, NODULAR PATTERN   Plan: please call L chest biopsy shows BCC in second layer of skin. Recommend excision and FBSE in 6 months

## 2024-12-26 NOTE — Telephone Encounter (Signed)
 Spoke with patient and advised of biopsy results. Excision and 6 month TBSE scheduled with Dr. Claudene. Patient voiced understanding to all.

## 2024-12-29 ENCOUNTER — Other Ambulatory Visit: Payer: Self-pay | Admitting: Pulmonary Disease

## 2024-12-29 ENCOUNTER — Other Ambulatory Visit (HOSPITAL_COMMUNITY): Payer: Self-pay

## 2024-12-29 DIAGNOSIS — J4550 Severe persistent asthma, uncomplicated: Secondary | ICD-10-CM

## 2024-12-29 MED ORDER — PREDNISONE 20 MG PO TABS
20.0000 mg | ORAL_TABLET | Freq: Every day | ORAL | 0 refills | Status: AC
  Filled 2024-12-29: qty 5, 5d supply, fill #0

## 2024-12-29 MED ORDER — AZITHROMYCIN 250 MG PO TABS
ORAL_TABLET | ORAL | 0 refills | Status: AC
  Filled 2024-12-29: qty 6, 5d supply, fill #0

## 2024-12-29 MED ORDER — ALBUTEROL SULFATE HFA 108 (90 BASE) MCG/ACT IN AERS
2.0000 | INHALATION_SPRAY | RESPIRATORY_TRACT | 4 refills | Status: AC | PRN
  Filled 2024-12-29: qty 6.7, 17d supply, fill #0

## 2024-12-29 NOTE — Progress Notes (Signed)
 Patient has severe persistent asthma and has had worsening of her chronic cough, nitric oxide  elevated 43 ppb.  She is also out of her albuterol  rescue inhaler.  Renewed rescue inhaler.  Continue Breyna  160/4.5, 2 inhalations twice a day.  Will prescribe prednisone 20 mg daily x 5 days and Azithromycin Z-Pak.    KYM Leita Sanders, MD Advanced Bronchoscopy P[facility]

## 2025-01-10 ENCOUNTER — Other Ambulatory Visit (HOSPITAL_COMMUNITY): Payer: Self-pay

## 2025-01-11 ENCOUNTER — Other Ambulatory Visit: Payer: Self-pay

## 2025-01-11 ENCOUNTER — Other Ambulatory Visit (HOSPITAL_COMMUNITY): Payer: Self-pay

## 2025-01-12 ENCOUNTER — Other Ambulatory Visit (HOSPITAL_COMMUNITY): Payer: Self-pay

## 2025-01-13 ENCOUNTER — Other Ambulatory Visit (HOSPITAL_COMMUNITY): Payer: Self-pay

## 2025-01-18 ENCOUNTER — Ambulatory Visit

## 2025-01-18 VITALS — BP 114/78 | HR 100 | Temp 98.1°F | Ht 62.0 in | Wt 153.1 lb

## 2025-01-18 DIAGNOSIS — F315 Bipolar disorder, current episode depressed, severe, with psychotic features: Secondary | ICD-10-CM | POA: Diagnosis not present

## 2025-01-18 DIAGNOSIS — Z13228 Encounter for screening for other metabolic disorders: Secondary | ICD-10-CM | POA: Diagnosis not present

## 2025-01-18 DIAGNOSIS — Z1321 Encounter for screening for nutritional disorder: Secondary | ICD-10-CM

## 2025-01-18 DIAGNOSIS — Z131 Encounter for screening for diabetes mellitus: Secondary | ICD-10-CM

## 2025-01-18 DIAGNOSIS — F5101 Primary insomnia: Secondary | ICD-10-CM | POA: Diagnosis not present

## 2025-01-18 DIAGNOSIS — E782 Mixed hyperlipidemia: Secondary | ICD-10-CM

## 2025-01-18 DIAGNOSIS — R7989 Other specified abnormal findings of blood chemistry: Secondary | ICD-10-CM | POA: Diagnosis not present

## 2025-01-18 DIAGNOSIS — E559 Vitamin D deficiency, unspecified: Secondary | ICD-10-CM

## 2025-01-18 DIAGNOSIS — Z130 Encounter for screening for diseases of the blood and blood-forming organs and certain disorders involving the immune mechanism: Secondary | ICD-10-CM

## 2025-01-18 DIAGNOSIS — Z1329 Encounter for screening for other suspected endocrine disorder: Secondary | ICD-10-CM

## 2025-01-18 DIAGNOSIS — N3944 Nocturnal enuresis: Secondary | ICD-10-CM

## 2025-01-18 DIAGNOSIS — Z0000 Encounter for general adult medical examination without abnormal findings: Secondary | ICD-10-CM

## 2025-01-18 NOTE — Patient Instructions (Signed)
 VISIT SUMMARY: During your annual physical exam, we reviewed your medications, discussed your preventive health maintenance, and addressed your sleep and urologic symptoms. Your weight is stable, and your blood pressure is normal.  YOUR PLAN: PREVENTIVE HEALTH MAINTENANCE: You are up to date on most vaccinations but need to schedule a mammogram and colonoscopy this year. Your IUD is likely due for removal. -Order blood work for kidney, liver function, thyroid , A1c, and cholesterol. -Ensure mammogram is scheduled with [facility]. -Schedule colonoscopy with [facility]. -Continue current medications as prescribed.  NOCTURNAL ENURESIS: You have occasional nocturnal enuresis without daytime incontinence or urinary pain. This may be due to an electrolyte imbalance or high blood sugar. -Order blood work to check electrolytes and A1c. -Consider nightly medication if enuresis becomes more frequent.  If you have any problems before your next visit feel free to message me via MyChart (minor issues or questions) or call the office, otherwise you may reach out to schedule an office visit.  Thank you! Saddie Sacks, PA-C

## 2025-01-21 DIAGNOSIS — N3944 Nocturnal enuresis: Secondary | ICD-10-CM | POA: Insufficient documentation

## 2025-01-21 DIAGNOSIS — Z0000 Encounter for general adult medical examination without abnormal findings: Secondary | ICD-10-CM | POA: Insufficient documentation

## 2025-01-21 NOTE — Assessment & Plan Note (Addendum)
 Still struggles with insomnia occasionally, but generally well-controlled with current medication.  Continue mirtazapine  45 mg at bedtime as prescribed by psychiatry.

## 2025-01-21 NOTE — Assessment & Plan Note (Signed)
 Routine exam with stable weight, normal blood pressure, and current immunizations. Mammogram and colonoscopy due. Pneumonia vaccine declined. Medications reviewed. IUD likely due for removal this year. No smoking or alcohol use. - Ordered blood work for kidney, liver function, thyroid , A1c, and cholesterol. - Ensure mammogram is scheduled with [facility]. - Schedule colonoscopy with [facility]. - Discussed pneumonia vaccine; she declined. - Continue current medications as prescribed.

## 2025-01-21 NOTE — Assessment & Plan Note (Signed)
 Differential includes electrolyte imbalance or hyperglycemia. Nocturia frequency low, medication not necessary currently. - Ordered blood work to check electrolytes and A1c. - Consider nightly medication such as oxybutynin or desmopressin if enuresis becomes more frequent.

## 2025-01-21 NOTE — Progress Notes (Signed)
 "  Complete physical exam  Patient: Shelia Ramos   DOB: 1971/06/13   54 y.o. Female  MRN: [id number]  Subjective:    Chief Complaint  Patient presents with   Annual Exam    Physical     History of Present Illness   Shelia Ramos is a 54 year old female who presents for an annual physical exam.  Preventive health maintenance - Up to date on tetanus, shingles, and influenza vaccinations - Due for colonoscopy and mammogram this year  Medication management - Currently taking risperidone , benztropine , and mirtazapine  (Remeron ) - Uses hydroxyzine  as needed - all psych meds managed by psychiatry  - Uses an estrogen patch managed by OBGYN - Inhalers and eye drops managed by pulmonology - Singulair  recently discontinued due to potential drug interactions - Currently using Anoro Ellipta inhaler  Sleep disturbance - Variable sleep quality, with some nights better than others  Gynecologic history - Mirena  IUD in place, believed to be due for removal this year  Urologic symptoms - Occasional nocturia, waking up to urinate at night, but not frequent - No daytime incontinence - No dysuria or urinary pain  Oncologic history - History of skin cancer, first occurrence - Scheduled for surgical excision on February 11th  Weight status - Weight remains stable        Most recent fall risk assessment:    01/18/2025    4:06 PM  Fall Risk   Falls in the past year? 0  Injury with Fall? 0  Risk for fall due to : No Fall Risks  Follow up Falls evaluation completed     Most recent depression screenings:    01/18/2025    4:06 PM 07/18/2024    4:02 PM  PHQ 2/9 Scores  PHQ - 2 Score 0 0  PHQ- 9 Score 3     Vision:Within last year and Dental: No current dental problems and Receives regular dental care    Patient Care Team: Gayle Saddie JULIANNA DEVONNA as PCP - General (Physician Assistant) Shila Gustav GAILS, MD as Consulting Physician (Gastroenterology) Burt Fus,  DPM as Consulting Physician (Podiatry) Sarrah Browning, MD as Consulting Physician (Obstetrics and Gynecology) Jeneal Danita Macintosh, MD as Consulting Physician (Allergy) Noralyn Flock, MD as Referring Physician (Psychiatry)   Show/hide medication list[1]  ROS   Per HPI     Objective:     BP 114/78   Pulse 100   Temp 98.1 F (36.7 C) (Oral)   Ht 5' 2 (1.575 m)   Wt 153 lb 1.3 oz (69.4 kg)   SpO2 97%   BMI 28.00 kg/m    Physical Exam Constitutional:      General: She is not in acute distress.    Appearance: Normal appearance.  Cardiovascular:     Rate and Rhythm: Normal rate and regular rhythm.     Heart sounds: Normal heart sounds. No murmur heard.    No friction rub. No gallop.  Pulmonary:     Effort: Pulmonary effort is normal. No respiratory distress.     Breath sounds: Normal breath sounds.  Abdominal:     General: Bowel sounds are normal.  Musculoskeletal:        General: No swelling.     Cervical back: Neck supple.  Lymphadenopathy:     Cervical: No cervical adenopathy.  Skin:    General: Skin is warm and dry.  Neurological:     General: No focal deficit present.     Mental Status: She is  alert.  Psychiatric:        Mood and Affect: Mood normal.        Behavior: Behavior normal.        Thought Content: Thought content normal.       No results found for any visits on 01/18/25. Last CBC Lab Results  Component Value Date   WBC 7.3 09/08/2024   HGB 10.3 (L) 09/08/2024   HCT 32.3 (L) 09/08/2024   MCV 81.8 09/08/2024   MCH 26.1 09/08/2024   RDW 17.9 (H) 09/08/2024   PLT 295 09/08/2024   Last metabolic panel Lab Results  Component Value Date   GLUCOSE 102 (H) 07/21/2024   NA 139 07/21/2024   K 4.4 07/21/2024   CL 104 07/21/2024   CO2 20 07/21/2024   BUN 19 07/21/2024   CREATININE 1.06 (H) 07/21/2024   EGFR 62 07/21/2024   CALCIUM 10.6 (H) 07/21/2024   PROT 7.4 07/21/2024   ALBUMIN 4.0 07/21/2024   LABGLOB 3.4 07/21/2024    AGRATIO 1.4 04/30/2023   BILITOT 0.2 07/21/2024   ALKPHOS 74 07/21/2024   AST 11 07/21/2024   ALT 9 07/21/2024   ANIONGAP 11 04/30/2017   Last lipids Lab Results  Component Value Date   CHOL 166 07/21/2024   HDL 47 07/21/2024   LDLCALC 102 (H) 07/21/2024   TRIG 94 07/21/2024   CHOLHDL 3.5 07/21/2024   Last hemoglobin A1c Lab Results  Component Value Date   HGBA1C 5.6 10/30/2022   Last thyroid  functions Lab Results  Component Value Date   TSH 1.140 10/30/2022   FREET4 1.15 08/23/2018   Last vitamin D  Lab Results  Component Value Date   VD25OH 22.2 (L) 02/11/2024        Assessment & Plan:    Routine Health Maintenance and Physical Exam  Health Maintenance  Topic Date Due   Breast Cancer Screening  05/29/2024   Colon Cancer Screening  11/01/2024   COVID-19 Vaccine (3 - Pfizer risk series) 02/03/2025*   Pneumococcal Vaccine for age over 22 (1 of 2 - PCV) 01/18/2026*   Hepatitis B Vaccine (1 of 3 - 19+ 3-dose series) 01/18/2026*   Pap with HPV screening  04/22/2025   DTaP/Tdap/Td vaccine (2 - Td or Tdap) 08/25/2028   Flu Shot  Completed   HPV Vaccine (No Doses Required) Completed   Hepatitis C Screening  Completed   HIV Screening  Completed   Zoster (Shingles) Vaccine  Completed   Meningitis B Vaccine  Aged Out  *Topic was postponed. The date shown is not the original due date.    Discussed health benefits of physical activity, and encouraged her to engage in regular exercise appropriate for her age and condition.  Mixed hyperlipidemia Assessment & Plan: Last lipid panel: LDL 102, HDL 47, triglycerides 94.  Cholesterol has improved over time with lifestyle changes.  Encouraged her to continue with diet and exercise to maintain healthy cholesterol levels.  If LDL continues to elevate, consider adding low-dose rosuvastatin.  Orders: -     Lipid panel; Future -     Comprehensive metabolic panel with GFR; Future  Elevated serum creatinine -     Comprehensive  metabolic panel with GFR; Future  Vitamin D  deficiency -     VITAMIN D  25 Hydroxy (Vit-D Deficiency, Fractures); Future  Screening for diabetes mellitus -     Hemoglobin A1c; Future  Screening for endocrine, nutritional, metabolic and immunity disorder -     VITAMIN D  25 Hydroxy (Vit-D Deficiency,  Fractures); Future -     TSH; Future -     Hemoglobin A1c; Future -     Lipid panel; Future -     Comprehensive metabolic panel with GFR; Future -     CBC with Differential/Platelet; Future  Primary insomnia Assessment & Plan: Still struggles with insomnia occasionally, but generally well-controlled with current medication.  Continue mirtazapine  45 mg at bedtime as prescribed by psychiatry.   Bipolar disorder, curr episode depressed, severe, w/psychotic features [facility]) Assessment & Plan: Followed by psychiatry.  Symptoms are stable and well-controlled on current medication regimen.  Continue risperidone  3 mg daily, benztropine  0.5 mg nightly, hydroxyzine  25 mg as needed, mirtazapine  40 mg nightly.  Continue follow-up with Grayce Pander (psychiatry) as scheduled.   Wellness examination Assessment & Plan: Routine exam with stable weight, normal blood pressure, and current immunizations. Mammogram and colonoscopy due. Pneumonia vaccine declined. Medications reviewed. IUD likely due for removal this year. No smoking or alcohol use. - Ordered blood work for kidney, liver function, thyroid , A1c, and cholesterol. - Ensure mamm[facility] is scheduled with [facility]. - Schedule colonoscopy with [facility]. - Discussed pneumonia vaccine; she declined. - Continue current medications as prescribed.   Nocturnal enuresis Assessment & Plan: Differential includes electrolyte imbalance or hyperglycemia. Nocturia frequency low, medication not necessary currently. - Ordered blood work to check electrolytes and A1c. - Consider nightly medication such as oxybutynin or desmopressin if enuresis  becomes more frequent.      Return in about 1 year (around 01/18/2026) for Physical.     [name], PA-C     [1]  Outpatient Medications Prior to Visit  Medication Sig   albuterol  (VENTOLIN  HFA) 108 (90 Base) MCG/ACT inhaler Inhale 2 puffs into the lungs every 4 (four) hours as needed.   benztropine  (COGENTIN ) 0.5 MG tablet Take 1 tablet (0.5 mg total) by mouth at bedtime.   benztropine  (COGENTIN ) 0.5 MG tablet Take 1 tablet (0.5 mg total) by mouth at bedtime.   budesonide -formoterol  (BREYNA ) 160-4.5 MCG/ACT inhaler Inhale 2 puffs into the lungs in the morning and at bedtime.   EPINEPHrine  (EPIPEN  2-PAK) 0.3 mg/0.3 mL IJ SOAJ injection Inject 0.3 mg into the muscle as needed for anaphylaxis.   estradiol  (VIVELLE -DOT) 0.1 MG/24HR patch Place 1 patch (0.1 mg total) onto the skin 2 (two) times a week.   fexofenadine  (ALLEGRA ) 30 MG/5ML suspension Take 30 mLs (180 mg total) by mouth daily.   hydrOXYzine  (VISTARIL ) 25 MG capsule Take 1 capsule (25 mg total) by mouth daily as needed for panic.   hyoscyamine  (LEVSIN  SL) 0.125 MG SL tablet Place 1 tablet (0.125 mg) under the tongue at bedtime.   ketoconazole  (NIZORAL ) 2 % shampoo Apply to scalp as a shampoo 2-3 times weekly. Let sit for 5 minutes before rinsing.   levonorgestrel  (MIRENA , 52 MG,) 20 MCG/DAY IUD Mirena  20 mcg/24 hours (8 yrs) 52 mg intrauterine device  Take 1 device by intrauterine route.   mirtazapine  (REMERON ) 45 MG tablet Take 1 tablet (45 mg total) by mouth at bedtime.   Olopatadine-Mometasone (RYALTRIS ) 665-25 MCG/ACT SUSP 2 sprays each nostril twice a day as needed for runny or stuffy nose control.   risperiDONE  (RISPERDAL ) 3 MG tablet Take 1 tablet (3 mg total) by mouth at bedtime.   risperiDONE  (RISPERDAL ) 3 MG tablet Take 1 tablet (3 mg total) by mouth at bedtime.   Hyoscyamine  Sulfate SL 0.125 MG SUBL PLACE 1 TABLET UNDER THE TONGUE AT BEDTIME.   omeprazole  (PRILOSEC)  20 MG capsule TAKE 1 CAPSULE BY MOUTH DAILY    No facility-administered medications prior to visit.   "

## 2025-01-21 NOTE — Assessment & Plan Note (Signed)
 Last lipid panel: LDL 102, HDL 47, triglycerides 94.  Cholesterol has improved over time with lifestyle changes.  Encouraged her to continue with diet and exercise to maintain healthy cholesterol levels.  If LDL continues to elevate, consider adding low-dose rosuvastatin.

## 2025-01-21 NOTE — Assessment & Plan Note (Signed)
 Followed by psychiatry.  Symptoms are stable and well-controlled on current medication regimen.  Continue risperidone  3 mg daily, benztropine  0.5 mg nightly, hydroxyzine  25 mg as needed, mirtazapine  40 mg nightly.  Continue follow-up with Grayce Pander (psychiatry) as scheduled.

## 2025-01-25 ENCOUNTER — Encounter: Payer: Self-pay | Admitting: Pulmonary Disease

## 2025-01-25 ENCOUNTER — Ambulatory Visit: Admitting: Pulmonary Disease

## 2025-01-25 VITALS — BP 104/74 | HR 100 | Temp 97.6°F | Ht 62.0 in | Wt 155.0 lb

## 2025-01-25 DIAGNOSIS — J3089 Other allergic rhinitis: Secondary | ICD-10-CM

## 2025-01-25 DIAGNOSIS — J4550 Severe persistent asthma, uncomplicated: Secondary | ICD-10-CM

## 2025-01-25 DIAGNOSIS — R0602 Shortness of breath: Secondary | ICD-10-CM | POA: Diagnosis not present

## 2025-01-25 DIAGNOSIS — Z91018 Allergy to other foods: Secondary | ICD-10-CM

## 2025-01-25 LAB — NITRIC OXIDE: Nitric Oxide: 36

## 2025-01-25 NOTE — Patient Instructions (Signed)
 VISIT SUMMARY:  Today you had a follow-up appointment to discuss your asthma. You mentioned experiencing a coughing spell and wheezing last night, but your symptoms have since improved. Your inflammation markers have been steadily decreasing, which is a positive sign.  YOUR PLAN:  -SEVERE PERSISTENT ASTHMA: Severe persistent asthma is a chronic condition where the airways in your lungs are inflamed and narrowed, making it difficult to breathe. Your inflammation markers have significantly decreased, indicating improvement. You should continue using your Breyna  inhaler daily and use your rescue inhaler as needed for acute symptoms. There is no current need for allergy shots as your inflammation is decreasing.  INSTRUCTIONS:  Please continue using your Breyna  inhaler daily and your rescue inhaler as needed. We have scheduled a follow-up appointment in four months. If your symptoms worsen before then, please contact us  immediately.

## 2025-01-25 NOTE — Progress Notes (Signed)
 "  Subjective:    Patient ID: Shelia Ramos, female    DOB: [birth year], 54 y.o.   MRN: [id number]  Patient Care Team: Gayle Saddie JULIANNA DEVONNA as PCP - General (Physician Assistant) Shila Gustav GAILS, MD as Consulting Physician (Gastroenterology) Stover, Titorya, DPM as Consulting Physician (Podiatry) Sarrah Browning, MD as Consulting Physician (Obstetrics and Gynecology) Jeneal Danita Macintosh, MD as Consulting Physician (Allergy) Noralyn Flock, MD as Referring Physician (Psychiatry)  Chief Complaint  Patient presents with   Asthma    Using Breyna  daily. Occasional cough and wheezing.     BACKGROUND/INTERVAL:Patient is a 54 year old lifelong never smoker who presents for follow-up of cough and shortness of breath.  She was initially seen on 05 September 2024.  She has severe persistent asthma.  She is being followed by allergy and immunology as well.  Noted to have nut allergies.  She was last seen on 24 October 2024.  HPI Discussed the use of AI scribe software for clinical note transcription with the patient, who gave verbal consent to proceed.  History of Present Illness   Shelia Ramos is a 54 year old female with asthma who presents for follow-up of her condition.  She experienced a coughing spell last night accompanied by wheezing, but her symptoms have improved since then. She uses her rescue inhaler as needed during these episodes.  Her inflammation markers have been decreasing over time, starting from 123, then 117, 78, and most recently 36.  She is not currently receiving allergy shots and continues to use her Breyna  inhaler daily.  Very rare use of her albuterol  inhaler     DATA 06/29/2005 CXR PA and lateral: Hyperinflation and chronic bronchitic changes. 10/29/2023 eosinophils: 700 cells/uL 07/21/2024 eosinophils: 500 cells/uL 09/02/2024 nitric oxide : 123 ppb 09/05/2024 nitric oxide : 117 ppb 09/14/2024 PFTs: FEV1 2.04 L or 78% predicted, FVC 2.24 L or  67% predicted, FEV1/FVC 91% lung volumes normal, patient could not perform DLCO reliably.  Essentially normal study. 10/24/2024 nitric oxide : 78 ppb 01/26/2024 nitric oxide : 36 ppb  Review of Systems A 10 point review of systems was performed and it is as noted above otherwise negative.   Patient Active Problem List   Diagnosis Date Noted   Wellness examination 01/21/2025   Nocturnal enuresis 01/21/2025   Elevated serum creatinine 07/18/2024   Vitamin D  deficiency 05/30/2019   Environmental and seasonal allergies 05/30/2019   Mixed hyperlipidemia 08/25/2018   Bipolar disorder, curr episode depressed, severe, w/psychotic features (HCC) 05/03/2017   Bunion of great toe of left foot 10/09/2016   IBS (irritable bowel syndrome) 10/09/2016   Perimenopause- on Xulane 10/09/2016   Insomnia disorder 10/09/2016   Moderate persistent asthma-  09/28/2016   Allergic rhinoconjunctivitis 09/28/2016    Social History   Tobacco Use   Smoking status: Never    Passive exposure: Never   Smokeless tobacco: Never  Substance Use Topics   Alcohol use: No    Alcohol/week: 0.0 standard drinks of alcohol    Allergies[1]  Active Medications[2]  Immunization History  Administered Date(s) Administered   Influenza,inj,Quad PF,6+ Mos 10/09/2016, 09/18/2021, 10/16/2024   Influenza-Unspecified 09/27/2016, 10/13/2023   PFIZER(Purple Top)SARS-COV-2 Vaccination 07/01/2021, 07/22/2021   Tdap 08/25/2018   Zoster Recombinant(Shingrix) 09/18/2021, 03/19/2022        Objective:     Vitals:   01/25/25 1608  BP: 104/74  Pulse: 100  Temp: 97.6 F (36.4 C)  Height: 5' 2 (1.575 m)  Weight: 155 lb (70.3 kg)  SpO2: 97%  TempSrc: Temporal  BMI (Calculated): 28.34     SpO2: 97 %  GENERAL: Well-developed, well-nourished woman, no acute distress. HEAD: Normocephalic, atraumatic.  EYES: Pupils equal, round, reactive to light.  No scleral icterus.  MOUTH: Dentition intact, oral mucosa moist.  No  thrush. NECK: Supple. No thyromegaly. Trachea midline. No JVD.  No adenopathy. PULMONARY: Good air entry bilaterally.  No adventitious sounds noted today. CARDIOVASCULAR: S1 and S2. Regular rate and rhythm.  Grade 1/6 systolic ejection murmur left sternal border. ABDOMEN: Benign. MUSCULOSKELETAL: No joint deformity, no clubbing, no edema.  NEUROLOGIC: No overt focal deficit, no gait disturbance, speech is fluent. SKIN: Intact,warm,dry. PSYCH: Mood and behavior normal.  Lab Results  Component Value Date   NITRICOXIDE 36 01/25/2025  This result suggests intermediate (25-49) Type 2 (T2) airway inflammation; clinical correlation required.        Assessment & Plan:     ICD-10-CM   1. Severe persistent asthma without complication (HCC)  J45.50 Nitric oxide     2. Environmental and seasonal allergies  J30.89     3. Nut allergy  Z91.018       Orders Placed This Encounter  Procedures   Nitric oxide    Assessment and Plan    Severe persistent asthma Recent improvement in inflammation markers. Recent coughing spell and wheezing, but symptoms have improved. Inflammation markers have decreased significantly from 123 to 36. No current need for allergy shots as inflammation is decreasing. Continues daily use of Breyna . - Continue daily use of Breyna . - Use rescue inhaler as needed for acute symptoms. - Scheduled follow-up appointment in four months unless symptoms worsen.       Advised if symptoms do not improve or worsen, to please contact office for sooner follow up or seek emergency care.    I spent 30 minutes of dedicated to the care of this patient on the date of this encounter to include pre-visit review of records, face-to-face time with the patient discussing conditions above, post visit ordering of testing, clinical documentation with the electronic health record, making appropriate referrals as documented, and communicating necessary findings to members of the patients care  team.     C. Leita Sanders, MD Advanced Bronchoscopy PCCM [facility]    *This note was generated using voice recognition software/Dragon and/or AI transcription program.  Despite best efforts to proofread, errors can occur which can change the meaning. Any transcriptional errors that result from this process are unintentional and may not be fully corrected at the time of dictation.     [1] No Known Allergies [2]  Current Meds  Medication Sig   albuterol  (VENTOLIN  HFA) 108 (90 Base) MCG/ACT inhaler Inhale 2 puffs into the lungs every 4 (four) hours as needed.   benztropine  (COGENTIN ) 0.5 MG tablet Take 1 tablet (0.5 mg total) by mouth at bedtime.   benztropine  (COGENTIN ) 0.5 MG tablet Take 1 tablet (0.5 mg total) by mouth at bedtime.   budesonide -formoterol  (BREYNA ) 160-4.5 MCG/ACT inhaler Inhale 2 puffs into the lungs in the morning and at bedtime.   EPINEPHrine  (EPIPEN  2-PAK) 0.3 mg/0.3 mL IJ SOAJ injection Inject 0.3 mg into the muscle as needed for anaphylaxis.   estradiol  (VIVELLE -DOT) 0.1 MG/24HR patch Place 1 patch (0.1 mg total) onto the skin 2 (two) times a week.   fexofenadine  (ALLEGRA ) 30 MG/5ML suspension Take 30 mLs (180 mg total) by mouth daily.   hydrOXYzine  (VISTARIL ) 25 MG capsule Take 1 capsule (25 mg total) by mouth daily as needed for panic.   hyoscyamine  (LEVSIN   SL) 0.125 MG SL tablet Place 1 tablet (0.125 mg) under the tongue at bedtime.   ketoconazole  (NIZORAL ) 2 % shampoo Apply to scalp as a shampoo 2-3 times weekly. Let sit for 5 minutes before rinsing.   levonorgestrel  (MIRENA , 52 MG,) 20 MCG/DAY IUD Mirena  20 mcg/24 hours (8 yrs) 52 mg intrauterine device  Take 1 device by intrauterine route.   mirtazapine  (REMERON ) 45 MG tablet Take 1 tablet (45 mg total) by mouth at bedtime.   Olopatadine-Mometasone (RYALTRIS ) 665-25 MCG/ACT SUSP 2 sprays each nostril twice a day as needed for runny or stuffy nose control.   risperiDONE  (RISPERDAL ) 3 MG  tablet Take 1 tablet (3 mg total) by mouth at bedtime.   risperiDONE  (RISPERDAL ) 3 MG tablet Take 1 tablet (3 mg total) by mouth at bedtime.   "

## 2025-01-29 ENCOUNTER — Encounter: Payer: Self-pay | Admitting: Pulmonary Disease

## 2025-01-30 ENCOUNTER — Other Ambulatory Visit (HOSPITAL_COMMUNITY): Payer: Self-pay

## 2025-01-31 ENCOUNTER — Other Ambulatory Visit (HOSPITAL_COMMUNITY): Payer: Self-pay

## 2025-02-02 ENCOUNTER — Ambulatory Visit: Admitting: Allergy

## 2025-02-02 ENCOUNTER — Encounter: Payer: Self-pay | Admitting: Allergy

## 2025-02-02 ENCOUNTER — Other Ambulatory Visit: Payer: Self-pay

## 2025-02-02 ENCOUNTER — Other Ambulatory Visit (HOSPITAL_COMMUNITY): Payer: Self-pay

## 2025-02-02 VITALS — BP 116/78 | HR 116 | Temp 99.6°F | Resp 14 | Ht 62.5 in | Wt 153.7 lb

## 2025-02-02 DIAGNOSIS — J302 Other seasonal allergic rhinitis: Secondary | ICD-10-CM

## 2025-02-02 DIAGNOSIS — J4530 Mild persistent asthma, uncomplicated: Secondary | ICD-10-CM

## 2025-02-02 DIAGNOSIS — H6123 Impacted cerumen, bilateral: Secondary | ICD-10-CM

## 2025-02-02 DIAGNOSIS — T7800XD Anaphylactic reaction due to unspecified food, subsequent encounter: Secondary | ICD-10-CM

## 2025-02-02 MED ORDER — FEXOFENADINE HCL 30 MG/5ML PO SUSP: 180.0000 mg | Freq: Every day | ORAL | 5 refills | Status: AC

## 2025-02-02 MED ORDER — RYALTRIS 665-25 MCG/ACT NA SUSP: NASAL | 3 refills | Status: AC

## 2025-02-02 NOTE — Patient Instructions (Addendum)
 Allergic rhinitis and multiple environmental allergies Chronic allergic rhinitis with positive test to pollens, cockroach, dust mites, dog, and cat dander. Discontinued Singulair  due to interaction with antidepressant. - Use Ryaltris  nasal spray for nasal congestion and postnasal drip.  Use 2 sprays each nostril twice a day as needed for runny or stuffy nose control.  With using nasal sprays point tip of bottle toward eye on same side nostril and lean head slightly forward for best technique.   - Use Allegra  180mg  (liquid) as an oral antihistamine.   Asthma Chronic asthma with persistent cough and wheezing managed with Breyna  inhaler and albuterol  as rescue. - Continue Breyna  inhaler 2 puffs twice daily. - Use albuterol  inhaler as needed. - Continue follow-up care and management with Dr Tamea, your pulmonologist  Food allergy to peanuts and tree nuts (except pistachio) Positive previous allergy testing to peanuts and tree nuts.  No recent reactions, will reevaluation. - Testing showed very high IgE to hazelnut, walnut, Brazil nut, peanut , almond and moderate to low levels to cashew, pecan, pistachio.  - Continue avoidance of all nuts  - Continue access to EpiPen  for emergency use in case of allergic reaction - Follow emergency action plan in case of allergic reaction  Cerumen impaction - Discussed to ask her PCP if they are able to do earwax removal  Follow-up in 6 months or sooner if needed -------------------------------------------------------------------------------------------------  Avoidance measures:  Reducing Pollen Exposure  The American Academy of Allergy, Asthma and Immunology suggests the following steps to reduce your exposure to pollen during allergy seasons.    Do not hang sheets or clothing out to dry; pollen may collect on these items. Do not mow lawns or spend time around freshly cut grass; mowing stirs up pollen. Keep windows closed at night.  Keep car windows  closed while driving. Minimize morning activities outdoors, a time when pollen counts are usually at their highest. Stay indoors as much as possible when pollen counts or humidity is high and on windy days when pollen tends to remain in the air longer. Use air conditioning when possible.  Many air conditioners have filters that trap the pollen spores. Use a HEPA room air filter to remove pollen form the indoor air you breathe.   Control of Dust Mite Allergen    Dust mites play a major role in allergic asthma and rhinitis.  They occur in environments with high humidity wherever human skin is found.  Dust mites absorb humidity from the atmosphere (ie, they do not drink) and feed on organic matter (including shed human and animal skin).  Dust mites are a microscopic type of insect that you cannot see with the naked eye.  High levels of dust mites have been detected from mattresses, pillows, carpets, upholstered furniture, bed covers, clothes, soft toys and any woven material.  The principal allergen of the dust mite is found in its feces.  A gram of dust may contain 1,000 mites and 250,000 fecal particles.  Mite antigen is easily measured in the air during house cleaning activities.  Dust mites do not bite and do not cause harm to humans, other than by triggering allergies/asthma.    Ways to decrease your exposure to dust mites in your home:  Encase mattresses, box springs and pillows with a mite-impermeable barrier or cover   Wash sheets, blankets and drapes weekly in hot water (130 F) with detergent and dry them in a dryer on the hot setting.  Have the room cleaned frequently with a  vacuum cleaner and a damp dust-mop.  For carpeting or rugs, vacuuming with a vacuum cleaner equipped with a high-efficiency particulate air (HEPA) filter.  The dust mite allergic individual should not be in a room which is being cleaned and should wait 1 hour after cleaning before going into the room. Do not sleep on  upholstered furniture (eg, couches).   If possible removing carpeting, upholstered furniture and drapery from the home is ideal.  Horizontal blinds should be eliminated in the rooms where the person spends the most time (bedroom, study, television room).  Washable vinyl, roller-type shades are optimal. Remove all non-washable stuffed toys from the bedroom.  Wash stuffed toys weekly like sheets and blankets above.   Reduce indoor humidity to less than 50%.  Inexpensive humidity monitors can be purchased at most hardware stores.  Do not use a humidifier as can make the problem worse and are not recommended.   Control of Dog or Cat Allergen  Avoidance is the best way to manage a dog or cat allergy. If you have a dog or cat and are allergic to dog or cats, consider removing the dog or cat from the home. If you have a dog or cat but dont want to find it a new home, or if your family wants a pet even though someone in the household is allergic, here are some strategies that may help keep symptoms at bay:  Keep the pet out of your bedroom and restrict it to only a few rooms. Be advised that keeping the dog or cat in only one room will not limit the allergens to that room. Dont pet, hug or kiss the dog or cat; if you do, wash your hands with soap and water. High-efficiency particulate air (HEPA) cleaners run continuously in a bedroom or living room can reduce allergen levels over time. Regular use of a high-efficiency vacuum cleaner or a central vacuum can reduce allergen levels. Giving your dog or cat a bath at least once a week can reduce airborne allergen.   Control of Cockroach Allergen  Cockroach allergen has been identified as an important cause of acute attacks of asthma, especially in urban settings.  There are fifty-five species of cockroach that exist in the [place] , however only three, the American, German and Oriental species produce allergen that can affect patients with Asthma.   Allergens can be obtained from fecal particles, egg casings and secretions from cockroaches.    Remove food sources. Reduce access to water. Seal access and entry points. Spray runways with 0.5-1% Diazinon or Chlorpyrifos Blow boric acid power under stoves and refrigerator. Place bait stations (hydramethylnon) at feeding sites.

## 2025-02-02 NOTE — Progress Notes (Signed)
 "   Follow-up Note  RE: Shelia Ramos MRN: [id number] DOB: 01/28/1971 Date of Office Visit: 02/02/2025   History of present illness: Shelia Ramos is a 54 y.o. female presenting today for follow-up of allergic rhinitis, asthma, food allergy.  She was last seen in the office on 09/29/2024 by myself. Discussed the use of AI scribe software for clinical note transcription with the patient, who gave verbal consent to proceed.  She has a confirmed allergy to nuts with positive food allergy testing. She has ceased consuming all nuts, including pistachios, as a precaution.  She has access to her epinephrine  device in case of reaction.  She experiences improvement in her breathing and continues to use Breyna  for asthma management. She has not needed to use her rescue inhaler.   Nighttime nasal congestion persists, which she attributes to positional changes and possibly the heat in her house. She uses a Ryaltris  nasal spray once a day at night to manage this symptom.  She uses Allegra  for her allergies and is in need of a refill.  She is allergic to dust mites amongst other allergens, which may contribute to her nighttime congestion. She does not currently use dust mite covers for her pillows or mattress.   She is also allergic to tree pollen, which she anticipates will become an issue as the season changes.  She has earwax buildup, particularly in one ear, which sometimes causes clogging. She has not been able to find ear drops to address this issue.     Review of systems: 10pt ROS negative unless noted in HPI   Past medical/social/surgical/family history have been reviewed and are unchanged unless specifically indicated below.  No changes  Medication List: Current Outpatient Medications  Medication Sig Dispense Refill   albuterol  (VENTOLIN  HFA) 108 (90 Base) MCG/ACT inhaler Inhale 2 puffs into the lungs every 4 (four) hours as needed. 6.7 g 4   benztropine  (COGENTIN ) 0.5 MG tablet  Take 1 tablet (0.5 mg total) by mouth at bedtime. 90 tablet 1   benztropine  (COGENTIN ) 0.5 MG tablet Take 1 tablet (0.5 mg total) by mouth at bedtime. 90 tablet 1   budesonide -formoterol  (BREYNA ) 160-4.5 MCG/ACT inhaler Inhale 2 puffs into the lungs in the morning and at bedtime. 10.2 g 11   EPINEPHrine  (EPIPEN  2-PAK) 0.3 mg/0.3 mL IJ SOAJ injection Inject 0.3 mg into the muscle as needed for anaphylaxis. 2 each 1   estradiol  (VIVELLE -DOT) 0.1 MG/24HR patch Place 1 patch (0.1 mg total) onto the skin 2 (two) times a week. 24 patch 4   fexofenadine  (ALLEGRA ) 30 MG/5ML suspension Take 30 mLs (180 mg total) by mouth daily. 480 mL 5   hydrOXYzine  (VISTARIL ) 25 MG capsule Take 1 capsule (25 mg total) by mouth daily as needed for panic. 30 capsule 2   hyoscyamine  (LEVSIN  SL) 0.125 MG SL tablet Place 1 tablet (0.125 mg) under the tongue at bedtime. 30 tablet 3   Hyoscyamine  Sulfate SL 0.125 MG SUBL PLACE 1 TABLET UNDER THE TONGUE AT BEDTIME. 30 tablet 11   ketoconazole  (NIZORAL ) 2 % shampoo Apply to scalp as a shampoo 2-3 times weekly. Let sit for 5 minutes before rinsing. 120 mL 5   levonorgestrel  (MIRENA , 52 MG,) 20 MCG/DAY IUD Mirena  20 mcg/24 hours (8 yrs) 52 mg intrauterine device  Take 1 device by intrauterine route.     mirtazapine  (REMERON ) 45 MG tablet Take 1 tablet (45 mg total) by mouth at bedtime. 90 tablet 1   Olopatadine-Mometasone (RYALTRIS ) 665-25  MCG/ACT SUSP 2 sprays each nostril twice a day as needed for runny or stuffy nose control. 29 g 3   omeprazole  (PRILOSEC) 20 MG capsule TAKE 1 CAPSULE BY MOUTH DAILY 30 capsule 11   risperiDONE  (RISPERDAL ) 3 MG tablet Take 1 tablet (3 mg total) by mouth at bedtime. 90 tablet 1   risperiDONE  (RISPERDAL ) 3 MG tablet Take 1 tablet (3 mg total) by mouth at bedtime. 90 tablet 1   No current facility-administered medications for this visit.     Known medication allergies: Allergies[1]   Physical examination: Blood pressure 116/78, pulse (!) 116,  temperature 99.6 F (37.6 C), temperature source Temporal, resp. rate 14, height 5' 2.5 (1.588 m), weight 153 lb 11.2 oz (69.7 kg), SpO2 94%.  General: Alert, interactive, in no acute distress. HEENT: PERRLA, cerumen impaction bilaterally, turbinates minimally edematous without discharge, post-pharynx non erythematous. Neck: Supple without lymphadenopathy. Lungs: Clear to auscultation without wheezing, rhonchi or rales. {no increased work of breathing. CV: Normal S1, S2 without murmurs. Abdomen: Nondistended, nontender. Skin: Warm and dry, without lesions or rashes. Extremities:  No clubbing, cyanosis or edema. Neuro:   Grossly intact.  Diagnostics/Labs: Labs:  Component     Latest Ref Rng 09/29/2024  F017-IgE Hazelnut ([place])     Class IV kU/L 7.35 !   F256-IgE Walnut     Class IV kU/L 6.55 !   F202-IgE Cashew Nut     Class II kU/L 0.74 !   F018-IgE Brazil Nut     Class III kU/L 3.07 !   Peanut , IgE     Class IV kU/L 8.72 !   Macadamia Nut, IgE     Class III kU/L 1.91 !   Pecan Nut IgE     Class I kU/L 0.51 !   F203-IgE Pistachio Nut     Class II kU/L 0.81 !   F020-IgE Almond     Class III kU/L 2.80 !   F422-IgE Ara h 1     Class 0 kU/L <0.10   F423-IgE Ara h 2     Class 0 kU/L <0.10   F424-IgE Ara h 3     Class II kU/L 1.25 !   F447-IgE Ara h 6     Class 0 kU/L <0.10   F352-IgE Ara h 8     Class III kU/L 2.61 !   F427-IgE Ara h 9     Class V kU/L 34.90 !   Cor A 1 IgE     Class IV kU/L 5.81 !   Cor A 8 IgE     Class V kU/L 22.60 !   Cor A 9 IgE     Class I kU/L 0.51 !   Cor A 14 IgE     Class II kU/L 1.04 !   Jug R 1 IgE     Class 0 kU/L <0.10   Jug R 3 IgE     Class IV kU/L 6.22 !   Allergen Comments Note   ANA O 3 IgE     Class 0/I kU/L 0.13 !   Ber E 1 IgE     Class IV kU/L 5.66 !     Assessment and plan: Allergic rhinitis and multiple environmental allergies Chronic allergic rhinitis with positive test to pollens, cockroach, dust mites, dog,  and cat dander. Discontinued Singulair  due to interaction with antidepressant. - Use Ryaltris  nasal spray for nasal congestion and postnasal drip.  Use 2 sprays each nostril twice a day as  needed for runny or stuffy nose control.  With using nasal sprays point tip of bottle toward eye on same side nostril and lean head slightly forward for best technique.   - Use Allegra  180mg  (liquid) as an oral antihistamine.   Asthma Chronic asthma with persistent cough and wheezing managed with Breyna  inhaler and albuterol  as rescue. - Continue Breyna  inhaler 2 puffs twice daily. - Use albuterol  inhaler as needed. - Continue follow-up care and management with Dr Tamea, your pulmonologist  Food allergy to peanuts and tree nuts (except pistachio) Positive previous allergy testing to peanuts and tree nuts.  No recent reactions, will reevaluation. - Testing showed very high IgE to hazelnut, walnut, Brazil nut, peanut , almond and moderate to low levels to cashew, pecan, pistachio.  - Continue avoidance of all nuts  - Continue access to EpiPen  for emergency use in case of allergic reaction - Follow emergency action plan in case of allergic reaction  Cerumen impaction - Discussed to ask her PCP if they are able to do earwax removal  Follow-up in 6 months or sooner if needed  I appreciate the opportunity to take part in Kirti's care. Please do not hesitate to contact me with questions.  Sincerely,   Danita Brain, MD Allergy/Immunology [facility]      [1] No Known Allergies  "

## 2025-02-07 ENCOUNTER — Encounter: Admitting: Dermatology

## 2025-06-26 ENCOUNTER — Ambulatory Visit: Admitting: Dermatology

## 2025-08-03 ENCOUNTER — Ambulatory Visit: Admitting: Allergy

## 2025-12-24 ENCOUNTER — Ambulatory Visit: Admitting: Dermatology
